# Patient Record
Sex: Female | Born: 1970 | Race: White | Hispanic: No | Smoking: Never smoker
Health system: Southern US, Community
[De-identification: ages and names within clinical notes are randomized; demographics above are authoritative.]

## PROBLEM LIST (undated history)

## (undated) ENCOUNTER — Ambulatory Visit: Admission: EM | Payer: Medicaid Other | Source: Home / Self Care

## (undated) DIAGNOSIS — R519 Headache, unspecified: Secondary | ICD-10-CM

## (undated) DIAGNOSIS — R87619 Unspecified abnormal cytological findings in specimens from cervix uteri: Secondary | ICD-10-CM

## (undated) DIAGNOSIS — Q760 Spina bifida occulta: Secondary | ICD-10-CM

## (undated) DIAGNOSIS — F909 Attention-deficit hyperactivity disorder, unspecified type: Secondary | ICD-10-CM

## (undated) DIAGNOSIS — Z8041 Family history of malignant neoplasm of ovary: Secondary | ICD-10-CM

## (undated) DIAGNOSIS — N390 Urinary tract infection, site not specified: Secondary | ICD-10-CM

## (undated) DIAGNOSIS — I341 Nonrheumatic mitral (valve) prolapse: Secondary | ICD-10-CM

## (undated) DIAGNOSIS — I10 Essential (primary) hypertension: Secondary | ICD-10-CM

## (undated) DIAGNOSIS — N83209 Unspecified ovarian cyst, unspecified side: Secondary | ICD-10-CM

## (undated) DIAGNOSIS — F419 Anxiety disorder, unspecified: Secondary | ICD-10-CM

## (undated) HISTORY — PX: LEEP: SHX91

## (undated) HISTORY — DX: Headache, unspecified: R51.9

## (undated) HISTORY — DX: Essential (primary) hypertension: I10

## (undated) HISTORY — DX: Unspecified abnormal cytological findings in specimens from cervix uteri: R87.619

## (undated) HISTORY — DX: Family history of malignant neoplasm of ovary: Z80.41

---

## 2004-07-26 ENCOUNTER — Emergency Department: Payer: Self-pay | Admitting: Emergency Medicine

## 2004-10-18 ENCOUNTER — Emergency Department: Payer: Self-pay | Admitting: Emergency Medicine

## 2005-01-16 ENCOUNTER — Emergency Department: Payer: Self-pay | Admitting: Emergency Medicine

## 2005-03-09 ENCOUNTER — Emergency Department: Payer: Self-pay | Admitting: Unknown Physician Specialty

## 2005-07-04 ENCOUNTER — Emergency Department: Payer: Self-pay | Admitting: Emergency Medicine

## 2005-07-04 ENCOUNTER — Other Ambulatory Visit: Payer: Self-pay

## 2005-08-26 ENCOUNTER — Emergency Department: Payer: Self-pay | Admitting: Emergency Medicine

## 2005-09-18 ENCOUNTER — Emergency Department: Payer: Self-pay | Admitting: Emergency Medicine

## 2006-03-06 ENCOUNTER — Emergency Department: Payer: Self-pay | Admitting: Emergency Medicine

## 2006-06-03 ENCOUNTER — Emergency Department: Payer: Self-pay | Admitting: Emergency Medicine

## 2006-06-07 ENCOUNTER — Emergency Department: Payer: Self-pay | Admitting: Emergency Medicine

## 2006-08-03 ENCOUNTER — Emergency Department: Payer: Self-pay | Admitting: General Practice

## 2007-03-02 ENCOUNTER — Emergency Department: Payer: Self-pay | Admitting: Emergency Medicine

## 2007-04-29 ENCOUNTER — Emergency Department: Payer: Self-pay | Admitting: Emergency Medicine

## 2007-07-31 ENCOUNTER — Ambulatory Visit: Payer: Self-pay | Admitting: Family Medicine

## 2007-09-28 ENCOUNTER — Ambulatory Visit: Payer: Self-pay | Admitting: Family Medicine

## 2007-11-14 ENCOUNTER — Emergency Department: Payer: Self-pay | Admitting: Emergency Medicine

## 2007-11-20 ENCOUNTER — Ambulatory Visit: Payer: Self-pay | Admitting: Family Medicine

## 2008-01-03 ENCOUNTER — Ambulatory Visit: Payer: Self-pay | Admitting: Family Medicine

## 2008-03-05 ENCOUNTER — Ambulatory Visit: Payer: Self-pay | Admitting: Family Medicine

## 2008-09-18 ENCOUNTER — Emergency Department: Payer: Self-pay | Admitting: Emergency Medicine

## 2009-03-25 ENCOUNTER — Emergency Department: Payer: Self-pay | Admitting: Emergency Medicine

## 2009-07-07 ENCOUNTER — Ambulatory Visit: Payer: Self-pay | Admitting: Internal Medicine

## 2009-07-09 ENCOUNTER — Ambulatory Visit: Payer: Self-pay | Admitting: Internal Medicine

## 2009-07-11 ENCOUNTER — Ambulatory Visit: Payer: Self-pay | Admitting: Internal Medicine

## 2010-03-19 ENCOUNTER — Ambulatory Visit: Payer: Self-pay | Admitting: Family Medicine

## 2010-06-25 ENCOUNTER — Ambulatory Visit: Payer: Self-pay | Admitting: Internal Medicine

## 2010-10-28 ENCOUNTER — Ambulatory Visit: Payer: Self-pay | Admitting: Internal Medicine

## 2010-10-29 ENCOUNTER — Ambulatory Visit: Payer: Self-pay | Admitting: Internal Medicine

## 2010-10-30 ENCOUNTER — Ambulatory Visit: Payer: Self-pay | Admitting: Family Medicine

## 2010-10-31 ENCOUNTER — Emergency Department: Payer: Self-pay | Admitting: Internal Medicine

## 2010-12-04 ENCOUNTER — Ambulatory Visit: Payer: Self-pay | Admitting: Family Medicine

## 2011-01-09 ENCOUNTER — Ambulatory Visit: Payer: Self-pay | Admitting: Family Medicine

## 2011-04-02 ENCOUNTER — Emergency Department: Payer: Self-pay | Admitting: Internal Medicine

## 2011-06-22 ENCOUNTER — Emergency Department: Payer: Self-pay | Admitting: Emergency Medicine

## 2011-12-29 ENCOUNTER — Emergency Department: Payer: Self-pay | Admitting: Unknown Physician Specialty

## 2011-12-29 LAB — COMPREHENSIVE METABOLIC PANEL
Albumin: 3.7 g/dL (ref 3.4–5.0)
Alkaline Phosphatase: 63 U/L (ref 50–136)
Anion Gap: 8 (ref 7–16)
Bilirubin,Total: 0.2 mg/dL (ref 0.2–1.0)
Creatinine: 0.63 mg/dL (ref 0.60–1.30)
Glucose: 77 mg/dL (ref 65–99)
Osmolality: 279 (ref 275–301)
Potassium: 3.3 mmol/L — ABNORMAL LOW (ref 3.5–5.1)
SGPT (ALT): 21 U/L
Total Protein: 7.2 g/dL (ref 6.4–8.2)

## 2011-12-29 LAB — URINALYSIS, COMPLETE
Bilirubin,UR: NEGATIVE
Glucose,UR: NEGATIVE mg/dL (ref 0–75)
Nitrite: NEGATIVE
Protein: NEGATIVE
RBC,UR: 1 /HPF (ref 0–5)
Specific Gravity: 1.001 (ref 1.003–1.030)
Squamous Epithelial: <1
WBC UR: <1 /HPF (ref 0–5)

## 2011-12-29 LAB — CBC
HCT: 35.4 % (ref 35.0–47.0)
MCH: 29.8 pg (ref 26.0–34.0)
MCHC: 33.7 g/dL (ref 32.0–36.0)
Platelet: 249 10*3/uL (ref 150–440)

## 2011-12-29 LAB — PREGNANCY, URINE: Pregnancy Test, Urine: NEGATIVE m[IU]/mL

## 2012-04-08 ENCOUNTER — Emergency Department: Payer: Self-pay | Admitting: Emergency Medicine

## 2012-04-08 LAB — CBC WITH DIFFERENTIAL/PLATELET
Basophil #: 0.1 10*3/uL (ref 0.0–0.1)
HCT: 35.8 % (ref 35.0–47.0)
Lymphocyte #: 1.9 10*3/uL (ref 1.0–3.6)
Lymphocyte %: 42.4 %
MCH: 30 pg (ref 26.0–34.0)
MCHC: 34.2 g/dL (ref 32.0–36.0)
Monocyte #: 0.6 x10 3/mm (ref 0.2–0.9)
Monocyte %: 13.7 %
Neutrophil #: 1.8 10*3/uL (ref 1.4–6.5)
Neutrophil %: 38.7 %
RDW: 13.1 % (ref 11.5–14.5)

## 2012-04-08 LAB — BASIC METABOLIC PANEL
BUN: 10 mg/dL (ref 7–18)
Chloride: 109 mmol/L — ABNORMAL HIGH (ref 98–107)
Co2: 27 mmol/L (ref 21–32)
Creatinine: 0.7 mg/dL (ref 0.60–1.30)
EGFR (African American): >60
Potassium: 3.4 mmol/L — ABNORMAL LOW (ref 3.5–5.1)
Sodium: 141 mmol/L (ref 136–145)

## 2012-04-18 ENCOUNTER — Emergency Department: Payer: Self-pay | Admitting: Unknown Physician Specialty

## 2012-04-18 LAB — URINALYSIS, COMPLETE
Ketone: NEGATIVE
Nitrite: NEGATIVE
Ph: 6 (ref 4.5–8.0)
Protein: NEGATIVE
RBC,UR: 2 /HPF (ref 0–5)

## 2012-04-18 LAB — CBC WITH DIFFERENTIAL/PLATELET
Basophil #: 0.1 10*3/uL (ref 0.0–0.1)
Basophil %: 0.6 %
Eosinophil %: 2.6 %
HGB: 12.9 g/dL (ref 12.0–16.0)
Lymphocyte %: 35.5 %
MCH: 29.4 pg (ref 26.0–34.0)
Monocyte #: 0.5 x10 3/mm (ref 0.2–0.9)
Neutrophil %: 55.1 %
Platelet: 298 10*3/uL (ref 150–440)
RDW: 13.1 % (ref 11.5–14.5)
WBC: 8.4 10*3/uL (ref 3.6–11.0)

## 2012-04-18 LAB — COMPREHENSIVE METABOLIC PANEL
Albumin: 4.1 g/dL (ref 3.4–5.0)
Alkaline Phosphatase: 79 U/L (ref 50–136)
Anion Gap: 9 (ref 7–16)
BUN: 9 mg/dL (ref 7–18)
Calcium, Total: 9.2 mg/dL (ref 8.5–10.1)
EGFR (African American): >60
Glucose: 62 mg/dL — ABNORMAL LOW (ref 65–99)
Osmolality: 278 (ref 275–301)
Potassium: 3.1 mmol/L — ABNORMAL LOW (ref 3.5–5.1)
Sodium: 141 mmol/L (ref 136–145)
Total Protein: 8.2 g/dL (ref 6.4–8.2)

## 2012-04-19 LAB — URINE CULTURE

## 2012-04-24 LAB — CULTURE, BLOOD (SINGLE)

## 2012-04-28 ENCOUNTER — Ambulatory Visit: Payer: Self-pay | Admitting: Family Medicine

## 2012-04-28 LAB — URINALYSIS, COMPLETE
Bilirubin,UR: NEGATIVE
Glucose,UR: NEGATIVE mg/dL (ref 0–75)
Ketone: NEGATIVE
Ph: 6.5 (ref 4.5–8.0)

## 2012-04-29 LAB — URINE CULTURE

## 2012-05-22 ENCOUNTER — Ambulatory Visit: Payer: Self-pay

## 2012-05-22 LAB — URINALYSIS, COMPLETE
Bacteria: NEGATIVE
Bilirubin,UR: NEGATIVE
Leukocyte Esterase: NEGATIVE
Nitrite: NEGATIVE
Ph: 5 (ref 4.5–8.0)
Specific Gravity: 1.01 (ref 1.003–1.030)

## 2012-05-22 LAB — WET PREP, GENITAL

## 2012-05-22 LAB — PREGNANCY, URINE: Pregnancy Test, Urine: NEGATIVE m[IU]/mL

## 2012-05-23 LAB — URINE CULTURE

## 2012-06-15 ENCOUNTER — Ambulatory Visit: Payer: Self-pay | Admitting: Internal Medicine

## 2012-06-15 LAB — URINALYSIS, COMPLETE
Bilirubin,UR: NEGATIVE
Glucose,UR: NEGATIVE mg/dL (ref 0–75)
Ketone: NEGATIVE
Specific Gravity: 1.005 (ref 1.003–1.030)
WBC UR: NONE SEEN /HPF (ref 0–5)

## 2012-08-15 ENCOUNTER — Ambulatory Visit: Payer: Self-pay | Admitting: Physician Assistant

## 2012-08-16 ENCOUNTER — Ambulatory Visit: Payer: Self-pay | Admitting: Physician Assistant

## 2012-08-16 LAB — CLOSTRIDIUM DIFFICILE BY PCR

## 2012-08-22 ENCOUNTER — Ambulatory Visit: Payer: Self-pay

## 2012-10-08 ENCOUNTER — Ambulatory Visit: Payer: Self-pay | Admitting: Family Medicine

## 2012-10-24 ENCOUNTER — Emergency Department: Payer: Self-pay | Admitting: Emergency Medicine

## 2012-10-24 LAB — CBC
HCT: 39.9 % (ref 35.0–47.0)
MCV: 89 fL (ref 80–100)
RBC: 4.48 10*6/uL (ref 3.80–5.20)
RDW: 13.4 % (ref 11.5–14.5)

## 2012-10-24 LAB — URINALYSIS, COMPLETE
Bilirubin,UR: NEGATIVE
Ketone: NEGATIVE
Leukocyte Esterase: NEGATIVE
Nitrite: NEGATIVE
Ph: 6 (ref 4.5–8.0)
Specific Gravity: 1.006 (ref 1.003–1.030)
Squamous Epithelial: <1
WBC UR: <1 /HPF (ref 0–5)

## 2012-10-24 LAB — WET PREP, GENITAL

## 2013-02-20 ENCOUNTER — Ambulatory Visit: Payer: Self-pay | Admitting: Internal Medicine

## 2013-02-20 LAB — URINALYSIS, COMPLETE
Glucose,UR: NEGATIVE mg/dL (ref 0–75)
Ketone: NEGATIVE
Ph: 5 (ref 4.5–8.0)
Specific Gravity: 1.025 (ref 1.003–1.030)

## 2013-02-23 ENCOUNTER — Ambulatory Visit: Payer: Self-pay | Admitting: Internal Medicine

## 2013-02-23 LAB — WBCS, STOOL

## 2013-04-12 ENCOUNTER — Encounter: Payer: Self-pay | Admitting: *Deleted

## 2013-04-25 ENCOUNTER — Ambulatory Visit: Payer: Self-pay | Admitting: General Surgery

## 2013-05-13 ENCOUNTER — Ambulatory Visit: Payer: Self-pay | Admitting: Family Medicine

## 2013-05-13 LAB — URINALYSIS, COMPLETE
Bacteria: NEGATIVE
Ketone: NEGATIVE
Leukocyte Esterase: NEGATIVE
Nitrite: NEGATIVE

## 2013-05-15 LAB — URINE CULTURE

## 2013-05-16 LAB — BETA STREP CULTURE(ARMC)

## 2013-05-25 ENCOUNTER — Encounter: Payer: Self-pay | Admitting: *Deleted

## 2013-08-03 ENCOUNTER — Ambulatory Visit: Payer: Self-pay | Admitting: Family Medicine

## 2013-08-03 LAB — URINALYSIS, COMPLETE
BILIRUBIN, UR: NEGATIVE
Glucose,UR: NEGATIVE mg/dL (ref 0–75)
KETONE: NEGATIVE
NITRITE: NEGATIVE
Ph: 6 (ref 4.5–8.0)
Protein: NEGATIVE
Specific Gravity: 1.015 (ref 1.003–1.030)

## 2013-08-05 ENCOUNTER — Ambulatory Visit: Payer: Self-pay | Admitting: Family Medicine

## 2013-08-05 LAB — URINALYSIS, COMPLETE
Bilirubin,UR: NEGATIVE
Glucose,UR: NEGATIVE mg/dL (ref 0–75)
KETONE: NEGATIVE
Leukocyte Esterase: NEGATIVE
NITRITE: NEGATIVE
Ph: 6 (ref 4.5–8.0)
Protein: NEGATIVE
SPECIFIC GRAVITY: 1.005 (ref 1.003–1.030)

## 2013-08-05 LAB — URINE CULTURE

## 2013-08-07 LAB — RAPID INFLUENZA A&B ANTIGENS

## 2013-11-02 ENCOUNTER — Ambulatory Visit: Payer: Self-pay | Admitting: Physician Assistant

## 2013-11-02 LAB — URINALYSIS, COMPLETE
Bacteria: NEGATIVE
Bilirubin,UR: NEGATIVE
Glucose,UR: NEGATIVE mg/dL (ref 0–75)
KETONE: NEGATIVE
Leukocyte Esterase: NEGATIVE
NITRITE: NEGATIVE
PH: 6.5 (ref 4.5–8.0)
PROTEIN: NEGATIVE
Specific Gravity: 1.01 (ref 1.003–1.030)

## 2013-11-03 LAB — URINE CULTURE

## 2014-01-28 ENCOUNTER — Emergency Department: Payer: Self-pay | Admitting: Emergency Medicine

## 2014-01-31 LAB — BETA STREP CULTURE(ARMC)

## 2014-02-24 ENCOUNTER — Emergency Department: Payer: Self-pay | Admitting: Emergency Medicine

## 2014-02-24 LAB — URINALYSIS, COMPLETE
BILIRUBIN, UR: NEGATIVE
Glucose,UR: NEGATIVE mg/dL (ref 0–75)
KETONE: NEGATIVE
Leukocyte Esterase: NEGATIVE
NITRITE: NEGATIVE
Ph: 6 (ref 4.5–8.0)
Protein: NEGATIVE
SPECIFIC GRAVITY: 1.002 (ref 1.003–1.030)
Squamous Epithelial: 2

## 2014-02-24 LAB — PREGNANCY, URINE: Pregnancy Test, Urine: NEGATIVE m[IU]/mL

## 2014-02-27 LAB — URINE CULTURE

## 2014-05-04 ENCOUNTER — Ambulatory Visit: Payer: Self-pay | Admitting: Physician Assistant

## 2014-05-04 LAB — URINALYSIS, COMPLETE
Bacteria: NEGATIVE
Bilirubin,UR: NEGATIVE
Glucose,UR: NEGATIVE
Ketone: NEGATIVE
Leukocyte Esterase: NEGATIVE
Nitrite: NEGATIVE
Ph: 6.5 (ref 5.0–8.0)
Protein: NEGATIVE
SQUAMOUS EPITHELIAL: NONE SEEN
Specific Gravity: 1.01 (ref 1.000–1.030)
WBC UR: NONE SEEN /HPF (ref 0–5)

## 2014-06-12 ENCOUNTER — Ambulatory Visit: Payer: Self-pay | Admitting: Family Medicine

## 2014-06-12 LAB — RAPID STREP-A WITH REFLX: Micro Text Report: NEGATIVE

## 2014-06-15 LAB — BETA STREP CULTURE(ARMC)

## 2014-06-16 ENCOUNTER — Ambulatory Visit: Payer: Self-pay | Admitting: Emergency Medicine

## 2014-06-16 LAB — URINALYSIS, COMPLETE
Bilirubin,UR: NEGATIVE
Glucose,UR: NEGATIVE
Ketone: NEGATIVE
Nitrite: NEGATIVE
PH: 6 (ref 5.0–8.0)
Protein: NEGATIVE
Specific Gravity: 1.015 (ref 1.000–1.030)

## 2014-07-23 ENCOUNTER — Emergency Department: Payer: Self-pay | Admitting: Emergency Medicine

## 2014-07-23 LAB — URINALYSIS, COMPLETE
Bilirubin,UR: NEGATIVE
Glucose,UR: NEGATIVE mg/dL (ref 0–75)
KETONE: NEGATIVE
LEUKOCYTE ESTERASE: NEGATIVE
Nitrite: NEGATIVE
PROTEIN: NEGATIVE
Ph: 7 (ref 4.5–8.0)
RBC,UR: <1 /HPF (ref 0–5)
SQUAMOUS EPITHELIAL: NONE SEEN
Specific Gravity: 1.001 (ref 1.003–1.030)
WBC UR: NONE SEEN /HPF (ref 0–5)

## 2014-07-23 LAB — PREGNANCY, URINE: PREGNANCY TEST, URINE: NEGATIVE m[IU]/mL

## 2014-08-07 ENCOUNTER — Ambulatory Visit: Payer: Self-pay

## 2014-08-07 LAB — URINALYSIS, COMPLETE
Bacteria: NEGATIVE
Bilirubin,UR: NEGATIVE
Glucose,UR: NEGATIVE
KETONE: NEGATIVE
Leukocyte Esterase: NEGATIVE
Nitrite: NEGATIVE
PROTEIN: NEGATIVE
Ph: 6 (ref 5.0–8.0)
Specific Gravity: 1.005 (ref 1.000–1.030)

## 2014-08-09 LAB — URINE CULTURE

## 2014-10-11 ENCOUNTER — Ambulatory Visit: Payer: Self-pay | Admitting: Physician Assistant

## 2014-11-08 ENCOUNTER — Ambulatory Visit
Admit: 2014-11-08 | Disposition: A | Discharge status: Home / Self Care | Payer: Self-pay | Attending: Family Medicine | Admitting: Family Medicine

## 2014-11-08 LAB — URINALYSIS, COMPLETE
BILIRUBIN, UR: NEGATIVE
Glucose,UR: NEGATIVE
KETONE: NEGATIVE
Leukocyte Esterase: NEGATIVE
Nitrite: NEGATIVE
Ph: 6 (ref 5.0–8.0)
Protein: NEGATIVE
Specific Gravity: 1.01 (ref 1.000–1.030)

## 2014-11-11 LAB — URINE CULTURE

## 2014-11-29 ENCOUNTER — Ambulatory Visit
Admission: EM | Admit: 2014-11-29 | Discharge: 2014-11-29 | Disposition: A | Discharge status: Home / Self Care | Payer: Medicaid Other | Attending: Family Medicine | Admitting: Family Medicine

## 2014-11-29 DIAGNOSIS — S30860A Insect bite (nonvenomous) of lower back and pelvis, initial encounter: Secondary | ICD-10-CM

## 2014-11-29 DIAGNOSIS — R21 Rash and other nonspecific skin eruption: Secondary | ICD-10-CM

## 2014-11-29 DIAGNOSIS — W57XXXA Bitten or stung by nonvenomous insect and other nonvenomous arthropods, initial encounter: Secondary | ICD-10-CM

## 2014-11-29 DIAGNOSIS — S20221A Contusion of right back wall of thorax, initial encounter: Secondary | ICD-10-CM | POA: Diagnosis not present

## 2014-11-29 HISTORY — DX: Attention-deficit hyperactivity disorder, unspecified type: F90.9

## 2014-11-29 HISTORY — DX: Nonrheumatic mitral (valve) prolapse: I34.1

## 2014-11-29 HISTORY — DX: Spina bifida occulta: Q76.0

## 2014-11-29 HISTORY — DX: Urinary tract infection, site not specified: N39.0

## 2014-11-29 MED ORDER — MUPIROCIN 2 % EX OINT: TOPICAL_OINTMENT | CUTANEOUS | Status: DC

## 2014-11-29 NOTE — ED Provider Notes (Signed)
CSN: [id number]     Arrival date & time 11/29/14  1514 History   First MD Initiated Contact with Patient 11/29/14 1526     Chief Complaint  Patient presents with  . Rash   (Consider location/radiation/quality/duration/timing/severity/associated sxs/prior Treatment) Patient is a 44 y.o. female presenting with rash. The history is provided by the patient.  Rash Location: R buttocks. Quality: bruising and redness   Severity:  Mild Timing:  Constant Chronicity:  New Context comment:  She is not sure if it is an sect bite or tick bite Relieved by:  Nothing Worsened by:  Nothing tried Associated symptoms: no fever     Past Medical History  Diagnosis Date  . ADHD (attention deficit hyperactivity disorder)   . Mitral valve prolapse   . UTI (urinary tract infection)   . Spina bifida occulta    Past Surgical History  Procedure Laterality Date  . Leep     History reviewed. No pertinent family history. History  Substance Use Topics  . Smoking status: Never Smoker   . Smokeless tobacco: Not on file  . Alcohol Use: No   OB History    No data available     Review of Systems  Constitutional: Negative for fever.  All other systems reviewed and are negative.   Allergies  Bactrim; Amoxicillin; Doxycycline; and Keflex  Home Medications   Prior to Admission medications   Medication Sig Start Date End Date Taking? Authorizing Provider  ALPRAZolam Duanne Moron) 1 MG tablet Take 1 mg by mouth 3 (three) times daily as needed for anxiety.   Yes Historical Provider, MD  amphetamine-dextroamphetamine (ADDERALL) 10 MG tablet Take 10 mg by mouth 2 (two) times daily.   Yes Historical Provider, MD  cetirizine (ZYRTEC) 10 MG tablet Take 10 mg by mouth daily.   Yes Historical Provider, MD  mupirocin ointment (BACTROBAN) 2 % APPLY TO AREA THREE TIMES A DAY 11/29/14   Frederich Cha, MD   BP 132/89 mmHg  Pulse 90  Temp(Src) 96.7 F (35.9 C) (Tympanic)  Resp 18  Ht 5\' 5"  (1.651 m)  Wt 180 lb (81.647  kg)  BMI 29.95 kg/m2  SpO2 98%  LMP 11/23/2014 Physical Exam  Constitutional: She is oriented to person, place, and time. She appears well-developed and well-nourished.  HENT:  Head: Normocephalic.  Musculoskeletal: Normal range of motion.  Neurological: She is alert and oriented to person, place, and time.  Skin: Rash noted.  Lesion on R buttocks appears to be more of a bruise than a bite.  Psychiatric: She has a normal mood and affect.    ED Course  Procedures (including critical care time) Labs Review Labs Reviewed - No data to display  Imaging Review No results found.   MDM   1. Rash and nonspecific skin eruption   2. Insect bite of buttock with local reaction, initial encounter   3. Superficial bruising of back, right, initial encounter        Frederich Cha, MD 11/29/14 [phone]

## 2014-11-29 NOTE — ED Notes (Signed)
Complaints of a "dark tiny spot" on right lower buttock x 3 days. Suspect tick bite but she is not sure. Felt dizzy and nauseous "couple days ago but that may be because I got off my birth control". Area tender to touch.

## 2015-01-01 ENCOUNTER — Ambulatory Visit
Admission: EM | Admit: 2015-01-01 | Discharge: 2015-01-01 | Disposition: A | Discharge status: Home / Self Care | Payer: Medicaid Other | Attending: Family Medicine | Admitting: Family Medicine

## 2015-01-01 ENCOUNTER — Encounter: Payer: Self-pay | Admitting: Emergency Medicine

## 2015-01-01 DIAGNOSIS — F909 Attention-deficit hyperactivity disorder, unspecified type: Secondary | ICD-10-CM | POA: Diagnosis not present

## 2015-01-01 DIAGNOSIS — Z79899 Other long term (current) drug therapy: Secondary | ICD-10-CM | POA: Insufficient documentation

## 2015-01-01 DIAGNOSIS — I341 Nonrheumatic mitral (valve) prolapse: Secondary | ICD-10-CM | POA: Diagnosis not present

## 2015-01-01 DIAGNOSIS — R3914 Feeling of incomplete bladder emptying: Secondary | ICD-10-CM | POA: Insufficient documentation

## 2015-01-01 DIAGNOSIS — R300 Dysuria: Secondary | ICD-10-CM | POA: Insufficient documentation

## 2015-01-01 DIAGNOSIS — R350 Frequency of micturition: Secondary | ICD-10-CM | POA: Diagnosis not present

## 2015-01-01 DIAGNOSIS — IMO0001 Reserved for inherently not codable concepts without codable children: Secondary | ICD-10-CM

## 2015-01-01 DIAGNOSIS — R109 Unspecified abdominal pain: Secondary | ICD-10-CM | POA: Diagnosis present

## 2015-01-01 LAB — URINALYSIS COMPLETE WITH MICROSCOPIC (ARMC ONLY)
BACTERIA UA: NONE SEEN — AB
BILIRUBIN URINE: NEGATIVE
GLUCOSE, UA: NEGATIVE mg/dL
KETONES UR: NEGATIVE mg/dL
Leukocytes, UA: NEGATIVE
NITRITE: NEGATIVE
Protein, ur: NEGATIVE mg/dL
Specific Gravity, Urine: 1.01 (ref 1.005–1.030)
pH: 6 (ref 5.0–8.0)

## 2015-01-01 LAB — PREGNANCY, URINE: PREG TEST UR: NEGATIVE

## 2015-01-01 NOTE — ED Notes (Signed)
Pt having abdominal cramping, urinary freq, took a macrobid she had at home, but her tongue swelled up. A doctor told her over the phone to take it with benadryl.  Having symptoms for a couple of days. Also sharp back pain. Was seeing Dr. Chancy Milroy.

## 2015-01-01 NOTE — ED Provider Notes (Signed)
CSN: [id number]     Arrival date & time 01/01/15  1611 History   First MD Initiated Contact with Patient 01/01/15 1703     Chief Complaint  Patient presents with  . Abdominal Cramping  . Urinary Frequency   (Consider location/radiation/quality/duration/timing/severity/associated sxs/prior Treatment) Patient is a 44 y.o. female presenting with dysuria. The history is provided by the patient.  Dysuria Pain quality:  Aching Pain severity:  Mild Onset quality:  Unable to specify Timing:  Intermittent Progression:  Waxing and waning Chronicity:  Recurrent Recent urinary tract infections: yes   Worsened by:  Caffeine Ineffective treatments:  Acetaminophen, NSAIDs and cranberry juice Urinary symptoms: frequent urination   Associated symptoms: no abdominal pain, no fever, no flank pain, no nausea, no vaginal discharge and no vomiting   Risk factors: recurrent urinary tract infections   Risk factors: not sexually active   44 yo F reports recurrent issues with bladder since 66 yo son was delivered. 8'10"  Forceps delivery wore an indwelling catheter home and kept for a couple of weeks by her report. In earlier years had multiple UTIs related to sexual activity. Now reports frequent episodes of the sensation of incomplete voiding, the need to repeat void frequently and dysuria. Denies sexual activity"for a long time". Had some low abdominal cramping and what she thought was dysuria and treated herself with a leftover Macrobid she had at home. Reports that her tongue got swollen and she had a verbal report to take benadryl from an outside office.Symptoms resolved.  Of note she electively stopped her BCPs continuous about a month ago because "she didn;t need them". Has not seen menstrual bleeding. Her young son accompanies her to appointment and is in attendance in exam room.  Has many antibiotic allergies/intolerances-see record.Has had multiple start up evaluations of bladder dysfunction but seems not to  have followed any to completion. Difficult to keep focused on current  presenting complaint as she has a list of things she has concerns about. Not feeling good rapport with present PCP needs a home base and coordinator of careplan. Insurance restrictions reported Past Medical History  Diagnosis Date  . ADHD (attention deficit hyperactivity disorder)   . Mitral valve prolapse   . UTI (urinary tract infection)   . Spina bifida occulta    Past Surgical History  Procedure Laterality Date  . Leep     No family history on file. History  Substance Use Topics  . Smoking status: Never Smoker   . Smokeless tobacco: Not on file  . 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  Negative for fever.  HENT: Negative.   Eyes: Negative.   Respiratory: Negative.  Negative for shortness of breath.   Cardiovascular: Negative.   Gastrointestinal: Negative for nausea, vomiting, abdominal pain and abdominal distention.  Endocrine: Negative.   Genitourinary: Positive for dysuria, urgency, frequency and menstrual problem. Negative for flank pain, vaginal bleeding and vaginal discharge.  Musculoskeletal: Negative.   Skin: Negative.   Allergic/Immunologic: Negative.   Neurological: Negative.   Hematological: Negative.   Psychiatric/Behavioral: Positive for decreased concentration. The patient is hyperactive.     Allergies  Bactrim; Macrobid; Amoxicillin;  Doxycycline; and Keflex  Home Medications   Prior to Admission medications   Medication Sig Start Date End Date Taking? Authorizing Provider  ALPRAZolam Duanne Moron) 1 MG tablet Take 1 mg by mouth 3 (three) times daily as needed for anxiety.    Historical Provider, MD  amphetamine-dextroamphetamine (ADDERALL)  10 MG tablet Take 10 mg by mouth 2 (two) times daily.    Historical Provider, MD  cetirizine (ZYRTEC) 10 MG tablet Take 10 mg by mouth daily.    Historical Provider, MD  mupirocin ointment (BACTROBAN) 2 % APPLY TO AREA THREE TIMES A DAY 11/29/14   Frederich Cha, MD   BP 117/75 mmHg  Pulse 95  Temp(Src) 98.8 F (37.1 C) (Tympanic)  Resp 16  Ht 5\' 8"  (1.727 m)  Wt 180 lb (81.647 kg)  BMI 27.38 kg/m2  SpO2 99%  LMP 11/17/2014 (Approximate) Physical Exam Constitutional -alert and oriented,well appearing and in no acute distress Head-atraumatic Eyes- conjunctiva normal, EOMI ,conjugate gaze Ears -negative bilat Nose- no congestion or rhinorrhea Mouth/throat- mucous membranes moist , Neck- supple  CV- regular rate, grossly normal heart sounds,  Resp-no distress, normal respiratory effort,clear to auscultation bilaterally Back - no CVAT GI- soft,non-tender,no distention, normal bowel sounds GU-not examined ( pt defers-son in room ),  MSK- no lower extremity tenderness nor edema,no joint effusion, ambulatory Neuro- normal speech and language, no gross focal neurological deficit appreciated, easily distracted, changes subjects frequently before completing concern at hand Skin-warm,dry ,intact;   ED Course  Procedures (including critical care time) Labs Review Labs Reviewed  URINALYSIS COMPLETEWITH MICROSCOPIC ([place]) - Abnormal; Notable for the following:    Hgb urine dipstick 1+ (*)    Bacteria, UA NONE SEEN (*)    Squamous Epithelial / LPF 0-5 (*)    All other components within normal limits  URINE CULTURE  PREGNANCY, URINE    Imaging Review No results found.     MDM   1. Feeling of incomplete bladder emptying   2. Frequency   3. Dysuria    Was very surprised to hear of negative urinalysis and she frequently self diagnoses and treats similar feels. Her description is very compatible with bladder dysfunction and with a large baby ,forceps and apparent post partum  dystonia she is a candidate for a GYN  and Uro evaluation or directly to [facility] if that could be arranged. Though u/a is grossly clean will allow it to be cultured for completeness- she did take one does atb a few days ago. She defers exam at this time as she won't let child out of room and does not want exam with him present understandably. She D/Ced her BCPs a few weeks ago--date unsure-- adamantly denying any exposure need. May be having mittleschmerz as cycles work to reestablish. Reports she was regular with moderate flow before BCPS.Marland Kitchen Her symptoms sound very mild. She is encouraged to use tylenol or ibuprofen and observe- report back to [place] ( utilizing outside child care if possible- or willingness to allow pelvic) should she develop increased discomfort. Reminded that light vaginal bleeding might occur and if menstrual like should be observed and recorded in datebook for future reference.  Diagnosis and recommended evaluations for treatment discussed. . Questions fielded, expectations  reviewed. Patient expresses understanding. Will return to [facility] with questions, concern or exacerbation.      Jan Fireman, PA-C 01/02/15 1207

## 2015-01-02 ENCOUNTER — Encounter: Payer: Self-pay | Admitting: Physician Assistant

## 2015-01-03 LAB — URINE CULTURE: Special Requests: NORMAL

## 2015-01-09 ENCOUNTER — Ambulatory Visit
Admission: EM | Admit: 2015-01-09 | Discharge: 2015-01-09 | Disposition: A | Discharge status: Home / Self Care | Payer: Medicaid Other | Attending: Family Medicine | Admitting: Family Medicine

## 2015-01-09 DIAGNOSIS — R8299 Other abnormal findings in urine: Secondary | ICD-10-CM | POA: Diagnosis not present

## 2015-01-09 DIAGNOSIS — R319 Hematuria, unspecified: Secondary | ICD-10-CM | POA: Diagnosis not present

## 2015-01-09 DIAGNOSIS — Z79899 Other long term (current) drug therapy: Secondary | ICD-10-CM | POA: Diagnosis not present

## 2015-01-09 DIAGNOSIS — R82998 Other abnormal findings in urine: Secondary | ICD-10-CM

## 2015-01-09 DIAGNOSIS — I341 Nonrheumatic mitral (valve) prolapse: Secondary | ICD-10-CM | POA: Diagnosis not present

## 2015-01-09 DIAGNOSIS — F909 Attention-deficit hyperactivity disorder, unspecified type: Secondary | ICD-10-CM | POA: Diagnosis not present

## 2015-01-09 DIAGNOSIS — N3090 Cystitis, unspecified without hematuria: Secondary | ICD-10-CM | POA: Diagnosis present

## 2015-01-09 LAB — URINALYSIS COMPLETE WITH MICROSCOPIC (ARMC ONLY)
Bacteria, UA: NONE SEEN — AB
Bilirubin Urine: NEGATIVE
GLUCOSE, UA: NEGATIVE mg/dL
Ketones, ur: NEGATIVE mg/dL
LEUKOCYTES UA: NEGATIVE
NITRITE: NEGATIVE
PH: 7 (ref 5.0–8.0)
PROTEIN: NEGATIVE mg/dL
SPECIFIC GRAVITY, URINE: 1.01 (ref 1.005–1.030)

## 2015-01-09 NOTE — ED Provider Notes (Signed)
CSN: [id number]     Arrival date & time 01/09/15  1526 History   First MD Initiated Contact with Patient 01/09/15 1610     Chief Complaint  Patient presents with  . Cystitis   (Consider location/radiation/quality/duration/timing/severity/associated sxs/prior Treatment) HPI Comments: 44 yo female with a complaint of frequent urination and mild discomfort with urination. Denies any fevers, chills, vomiting, flank pain.   Past Medical History  Diagnosis Date  . ADHD (attention deficit hyperactivity disorder)   . Mitral valve prolapse   . UTI (urinary tract infection)   . Spina bifida occulta    Past Surgical History  Procedure Laterality Date  . Leep    . Leep     Family History  Problem Relation Age of Onset  . Cancer Mother   . Cancer Father    History  Substance Use Topics  . Smoking status: Never Smoker   . Smokeless tobacco: Not on file  . 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 Authorizing Provider  ALPRAZolam Duanne Moron) 1 MG tablet Take 1 mg by mouth 3 (three) times daily as needed for anxiety.   Yes Historical Provider, MD  amphetamine-dextroamphetamine (ADDERALL) 10 MG tablet Take 10 mg by mouth 2 (two) times daily.   Yes Historical Provider, MD  cetirizine (ZYRTEC) 10 MG tablet Take 10 mg by mouth daily.   Yes Historical Provider, MD  mupirocin ointment (BACTROBAN) 2 % APPLY TO AREA THREE TIMES A DAY 11/29/14   Frederich Cha, MD   BP 134/80 mmHg  Pulse 82  Temp(Src) 97.8 F (36.6 C) (Tympanic)  Resp 16  Ht 5\' 5"  (1.651 m)  Wt 180 lb (81.647 kg)  BMI 29.95 kg/m2  SpO2 100%   LMP 01/03/2015 (Approximate) Physical Exam  Constitutional: She appears well-developed and well-nourished. No distress.  Abdominal: Soft. Bowel sounds are normal. She exhibits no distension and no mass. There is no tenderness. There is no rebound and no guarding.  Skin: No rash noted. She is not diaphoretic.  Nursing note and vitals reviewed.   ED Course  Procedures (including critical care time) Labs Review Labs Reviewed  URINALYSIS COMPLETEWITH MICROSCOPIC ([place]) - Abnormal; Notable for the following:    Hgb urine dipstick 1+ (*)    Bacteria, UA NONE SEEN (*)    Squamous Epithelial / LPF 0-5 (*)    All other components within normal limits  URINE CULTURE    Imaging Review No results found.   MDM   1. Crystalluria   2. Hematuria    Discharge Medication List as of 01/09/2015  4:42 PM    Plan: 1. Test results and diagnosis reviewed with patient 2.  Recommend supportive treatment with increased fluids and otc analgesics 3. Recommend establish care with PCP and f/u with urologist 4. F/u prn if symptoms worsen or don't improve    Norval Gable, MD 01/09/15 2047

## 2015-01-09 NOTE — Discharge Instructions (Signed)
Recommend establish care with PCP and urologist

## 2015-01-11 LAB — URINE CULTURE: Culture: 2000

## 2015-02-16 ENCOUNTER — Encounter: Payer: Self-pay | Admitting: *Deleted

## 2015-02-16 ENCOUNTER — Emergency Department: Payer: Medicaid Other

## 2015-02-16 ENCOUNTER — Emergency Department
Admission: EM | Admit: 2015-02-16 | Discharge: 2015-02-16 | Disposition: A | Discharge status: Home / Self Care | Payer: Medicaid Other | Attending: Emergency Medicine | Admitting: Emergency Medicine

## 2015-02-16 DIAGNOSIS — N8320 Unspecified ovarian cysts: Secondary | ICD-10-CM | POA: Diagnosis not present

## 2015-02-16 DIAGNOSIS — Z79899 Other long term (current) drug therapy: Secondary | ICD-10-CM | POA: Diagnosis not present

## 2015-02-16 DIAGNOSIS — R319 Hematuria, unspecified: Secondary | ICD-10-CM | POA: Diagnosis present

## 2015-02-16 DIAGNOSIS — R350 Frequency of micturition: Secondary | ICD-10-CM | POA: Diagnosis not present

## 2015-02-16 DIAGNOSIS — N83202 Unspecified ovarian cyst, left side: Secondary | ICD-10-CM

## 2015-02-16 DIAGNOSIS — Z792 Long term (current) use of antibiotics: Secondary | ICD-10-CM | POA: Diagnosis not present

## 2015-02-16 DIAGNOSIS — Z880 Allergy status to penicillin: Secondary | ICD-10-CM | POA: Diagnosis not present

## 2015-02-16 DIAGNOSIS — R109 Unspecified abdominal pain: Secondary | ICD-10-CM

## 2015-02-16 HISTORY — DX: Anxiety disorder, unspecified: F41.9

## 2015-02-16 LAB — URINALYSIS COMPLETE WITH MICROSCOPIC (ARMC ONLY)
Bilirubin Urine: NEGATIVE
Glucose, UA: NEGATIVE mg/dL
Ketones, ur: NEGATIVE mg/dL
LEUKOCYTES UA: NEGATIVE
Nitrite: NEGATIVE
Protein, ur: NEGATIVE mg/dL
RBC / HPF: NONE SEEN RBC/hpf (ref 0–5)
Specific Gravity, Urine: 1.005 (ref 1.005–1.030)
WBC, UA: NONE SEEN WBC/hpf (ref 0–5)
pH: 6 (ref 5.0–8.0)

## 2015-02-16 NOTE — ED Provider Notes (Signed)
[facility] Emergency Department Provider Note  ____________________________________________  Time seen:   I have reviewed the triage vital signs and the nursing notes.   HISTORY  Chief Complaint Hematuria      HPI Shirley [name] is a 44 y.o. female presents with urinary frequency and urgency 11 years however with worsening over the past week. Patient denies any dysuria or hematuria patient states that she has had feeling of pressure and urinary urgency and frequency since delivering her son in 2005.     Past Medical History  Diagnosis Date  . ADHD (attention deficit hyperactivity disorder)   . Mitral valve prolapse   . UTI (urinary tract infection)   . Spina bifida occulta   . Anxiety     There are no active problems to display for this patient.   Past Surgical History  Procedure Laterality Date  . Leep    . Leep      Current Outpatient Rx  Name  Route  Sig  Dispense  Refill  . ALPRAZolam (XANAX) 1 MG tablet   Oral   Take 1 mg by mouth 3 (three) times daily as needed for anxiety.         Marland Kitchen amphetamine-dextroamphetamine (ADDERALL) 10 MG tablet   Oral   Take 10 mg by mouth 2 (two) times daily.         . cetirizine (ZYRTEC) 10 MG tablet   Oral   Take 10 mg by mouth daily.         . mupirocin ointment (BACTROBAN) 2 %      APPLY TO AREA THREE TIMES A DAY   22 g   0     Allergies Bactrim; Macrobid; Amoxicillin; Doxycycline; and Keflex  Family History  Problem Relation Age of Onset  . Cancer Mother   . Cancer Father     Social History History  Substance Use Topics  . Smoking status: Never Smoker   . Smokeless tobacco: Not on file  . Alcohol Use: No    Review of Systems  Constitutional: Negative for fever. Eyes: Negative for visual changes. ENT: Negative for sore throat. Cardiovascular: Negative for chest pain. Respiratory: Negative for shortness of breath. Gastrointestinal: Negative for abdominal pain,  vomiting and diarrhea. Genitourinary: Negative for dysuria. Musculoskeletal: Negative for back pain. Skin: Negative for rash. Neurological: Negative for headaches, focal weakness or numbness.   10-point ROS otherwise negative.  ____________________________________________   PHYSICAL EXAM:  VITAL SIGNS: ED Triage Vitals  Enc Vitals Group     BP 02/16/15 0026 128/73 mmHg     Pulse Rate 02/16/15 0026 79     Resp 02/16/15 0026 18     Temp 02/16/15 0026 98.7 F (37.1 C)     Temp Source 02/16/15 0026 Oral     SpO2 02/16/15 0026 98 %     Weight 02/16/15 0032 175 lb (79.379 kg)     Height 02/16/15 0032 5\' 6"  (1.676 m)     Head Cir --      Peak Flow --      Pain Score --      Pain Loc --      Pain Edu? --      Excl. in [place]? --      Constitutional: Alert and oriented. Well appearing and in no distress. Eyes: Conjunctivae are normal. PERRL. Normal extraocular movements. ENT   Head: Normocephalic and atraumatic.   Nose: No congestion/rhinnorhea.   Mouth/Throat: Mucous membranes are moist.  Neck: No stridor. Cardiovascular: Normal rate, regular rhythm. Normal and symmetric distal pulses are present in all extremities. No murmurs, rubs, or gallops. Respiratory: Normal respiratory effort without tachypnea nor retractions. Breath sounds are clear and equal bilaterally. No wheezes/rales/rhonchi. Gastrointestinal: Soft and nontender. No distention. There is no CVA tenderness. Genitourinary: deferred Musculoskeletal: Nontender with normal range of motion in all extremities. No joint effusions.  No lower extremity tenderness nor edema. Neurologic:  Normal speech and language. No gross focal neurologic deficits are appreciated. Speech is normal.  Skin:  Skin is warm, dry and intact. No rash noted. Psychiatric: Mood and affect are normal. Speech and behavior are normal. Patient exhibits appropriate insight and judgment.  ____________________________________________    LABS  (pertinent positives/negatives)  Labs Reviewed  URINALYSIS COMPLETEWITH MICROSCOPIC ([place]) - Abnormal; Notable for the following:    Color, Urine COLORLESS (*)    APPearance CLEAR (*)    Hgb urine dipstick 2+ (*)    Bacteria, UA RARE (*)    Squamous Epithelial / LPF 0-5 (*)    All other components within normal limits    _______________  RADIOLOGY  CT scan revealed:   CT RENAL STONE STUDY (Final result) Result time: 02/16/15 02:47:44   Final result by Rad Results In Interface (02/16/15 02:47:44)   Narrative:   CLINICAL DATA: Acute onset of left flank pain. Initial encounter.  EXAM: CT ABDOMEN AND PELVIS WITHOUT CONTRAST  TECHNIQUE: Multidetector CT imaging of the abdomen and pelvis was performed following the standard protocol without IV contrast.  COMPARISON: Right-sided abdominal ultrasound performed 12/29/2011  FINDINGS: The visualized lung bases are clear.  The liver and spleen are unremarkable in appearance. The gallbladder is within normal limits. The pancreas and adrenal glands are unremarkable.  The kidneys are unremarkable in appearance. There is no evidence of hydronephrosis. No renal or ureteral stones are seen. No perinephric stranding is appreciated.  No free fluid is identified. Slight wall thickening along the distal ileum, within the pelvis, may reflect chronic sequelae of inflammation. The small bowel is otherwise unremarkable. The terminal ileum is unremarkable in appearance. The stomach is within normal limits. No acute vascular abnormalities are seen.  The appendix is normal in caliber, without evidence of appendicitis. The colon is unremarkable in appearance.  The bladder is mildly distended and grossly unremarkable. The uterus is grossly unremarkable in appearance. A 3.2 cm right adnexal cystic lesion is noted. The left ovary is unremarkable in appearance. No inguinal lymphadenopathy is seen.  No acute osseous abnormalities  are identified. Sclerotic change is noted at the right sacroiliac joint.  IMPRESSION: 1. No definite acute abnormality seen to explain the patient's symptoms. 2. Slight wall thickening along the distal ileum, within the pelvis, may reflect chronic sequelae of inflammation. The terminal ileum is unremarkable. 3. 3.2 cm adnexal cystic lesion is likely physiologic, though pelvic ultrasound could be considered for further evaluation, on an elective nonemergent basis.   Electronically Signed By: Garald Balding M.D. On: 02/16/2015 02:47          INITIAL IMPRESSION / ASSESSMENT AND PLAN / ED COURSE  Pertinent labs & imaging results that were available during my care of the patient were reviewed by me and considered in my medical decision making (see chart for details).  Patient referred to GYN regarding ovarian cysts on the left in addition patient referred to Dr. Bernardo Heater for outpatient evaluation of urinary frequency.  ____________________________________________   FINAL CLINICAL IMPRESSION(S) / ED DIAGNOSES  Final diagnoses:  Left flank  pain  Left ovarian cyst  Urinary frequency      Gregor Hams, MD 02/16/15 [phone]

## 2015-02-16 NOTE — Discharge Instructions (Signed)
Ovarian Cyst An ovarian cyst is a fluid-filled sac that forms on an ovary. The ovaries are small organs that produce eggs in women. Various types of cysts can form on the ovaries. Most are not cancerous. Many do not cause problems, and they often go away on their own. Some may cause symptoms and require treatment. Common types of ovarian cysts include:  Functional cysts--These cysts may occur every month during the menstrual cycle. This is normal. The cysts usually go away with the next menstrual cycle if the woman does not get pregnant. Usually, there are no symptoms with a functional cyst.  Endometrioma cysts--These cysts form from the tissue that lines the uterus. They are also called "chocolate cysts" because they become filled with blood that turns Shirley [name]. This type of cyst can cause pain in the lower abdomen during intercourse and with your menstrual period.  Cystadenoma cysts--This type develops from the cells on the outside of the ovary. These cysts can get very big and cause lower abdomen pain and pain with intercourse. This type of cyst can twist on itself, cut off its blood supply, and cause severe pain. It can also easily rupture and cause a lot of pain.  Dermoid cysts--This type of cyst is sometimes found in both ovaries. These cysts may contain different kinds of body tissue, such as skin, teeth, hair, or cartilage. They usually do not cause symptoms unless they get very big.  Theca lutein cysts--These cysts occur when too much of a certain hormone (human chorionic gonadotropin) is produced and overstimulates the ovaries to produce an egg. This is most common after procedures used to assist with the conception of a baby (in vitro fertilization). CAUSES   Fertility drugs can cause a condition in which multiple large cysts are formed on the ovaries. This is called ovarian hyperstimulation syndrome.  A condition called polycystic ovary syndrome can cause hormonal imbalances that can lead to  nonfunctional ovarian cysts. SIGNS AND SYMPTOMS  Many ovarian cysts do not cause symptoms. If symptoms are present, they may include:  Pelvic pain or pressure.  Pain in the lower abdomen.  Pain during sexual intercourse.  Increasing girth (swelling) of the abdomen.  Abnormal menstrual periods.  Increasing pain with menstrual periods.  Stopping having menstrual periods without being pregnant. DIAGNOSIS  These cysts are commonly found during a routine or annual pelvic exam. Tests may be ordered to find out more about the cyst. These tests may include:  Ultrasound.  X-ray of the pelvis.  CT scan.  MRI.  Blood tests. TREATMENT  Many ovarian cysts go away on their own without treatment. Your health care provider may want to check your cyst regularly for 2-3 months to see if it changes. For women in menopause, it is particularly important to monitor a cyst closely because of the higher rate of ovarian cancer in menopausal women. When treatment is needed, it may include any of the following:  A procedure to drain the cyst (aspiration). This may be done using a long needle and ultrasound. It can also be done through a laparoscopic procedure. This involves using a thin, lighted tube with a tiny camera on the end (laparoscope) inserted through a small incision.  Surgery to remove the whole cyst. This may be done using laparoscopic surgery or an open surgery involving a larger incision in the lower abdomen.  Hormone treatment or birth control pills. These methods are sometimes used to help dissolve a cyst. HOME CARE INSTRUCTIONS   Only take over-the-counter  or prescription medicines as directed by your health care provider.  Follow up with your health care provider as directed.  Get regular pelvic exams and Pap tests. SEEK MEDICAL CARE IF:   Your periods are late, irregular, or painful, or they stop.  Your pelvic pain or abdominal pain does not go away.  Your abdomen becomes  larger or swollen.  You have pressure on your bladder or trouble emptying your bladder completely.  You have pain during sexual intercourse.  You have feelings of fullness, pressure, or discomfort in your stomach.  You lose weight for no apparent reason.  You feel generally ill.  You become constipated.  You lose your appetite.  You develop acne.  You have an increase in body and facial hair.  You are gaining weight, without changing your exercise and eating habits.  You think you are pregnant. SEEK IMMEDIATE MEDICAL CARE IF:   You have increasing abdominal pain.  You feel sick to your stomach (nauseous), and you throw up (vomit).  You develop a fever that comes on suddenly.  You have abdominal pain during a bowel movement.  Your menstrual periods become heavier than usual. MAKE SURE YOU:  Understand these instructions.  Will watch your condition.  Will get help right away if you are not doing well or get worse. Document Released: 07/14/2005 Document Revised: 07/19/2013 Document Reviewed: 03/21/2013 [facility] Patient Information 2015 [place], [place]. This information is not intended to replace advice given to you by your health care provider. Make sure you discuss any questions you have with your health care provider.  Urinary Frequency The number of times a normal person urinates depends upon how much liquid they take in and how much liquid they are losing. If the temperature is hot and there is high humidity, then the person will sweat more and usually breathe a little more frequently. These factors decrease the amount of frequency of urination that would be considered normal. The amount you drink is easily determined, but the amount of fluid lost is sometimes more difficult to calculate.  Fluid is lost in two ways:  Sensible fluid loss is usually measured by the amount of urine that you get rid of. Losses of fluid can also occur with diarrhea.  Insensible fluid  loss is more difficult to measure. It is caused by evaporation. Insensible loss of fluid occurs through breathing and sweating. It usually ranges from a little less than a quart to a little more than a quart of fluid a day. In normal temperatures and activity levels, the average person may urinate 4 to 7 times in a 24-hour period. Needing to urinate more often than that could indicate a problem. If one urinates 4 to 7 times in 24 hours and has large volumes each time, that could indicate a different problem from one who urinates 4 to 7 times a day and has small volumes. The time of urinating is also important. Most urinating should be done during the waking hours. Getting up at night to urinate frequently can indicate some problems. CAUSES  The bladder is the organ in your lower abdomen that holds urine. Like a balloon, it swells some as it fills up. Your nerves sense this and tell you it is time to head for the bathroom. There are a number of reasons that you might feel the need to urinate more often than usual. They include:  Urinary tract infection. This is usually associated with other signs such as burning when you urinate.  In men, problems with the prostate (a walnut-size gland that is located near the tube that carries urine out of your body). There are two reasons why the prostate can cause an increased frequency of urination:  An enlarged prostate that does not let the bladder empty well. If the bladder only half empties when you urinate, then it only has half the capacity to fill before you have to urinate again.  The nerves in the bladder become more hypersensitive with an increased size of the prostate even if the bladder empties completely.  Pregnancy.  Obesity. Excess weight is more likely to cause a problem for women than for men.  Bladder stones or other bladder problems.  Caffeine.  Alcohol.  Medications. For example, drugs that help the body get rid of extra fluid  (diuretics) increase urine production. Some other medicines must be taken with lots of fluids.  Muscle or nerve weakness. This might be the result of a spinal cord injury, a stroke, multiple sclerosis, or Parkinson disease.  Long-standing diabetes can decrease the sensation of the bladder. This loss of sensation makes it harder to sense the bladder needs to be emptied. Over a period of years, the bladder is stretched out by constant overfilling. This weakens the bladder muscles so that the bladder does not empty well and has less capacity to fill with new urine.  Interstitial cystitis (also called painful bladder syndrome). This condition develops because the tissues that line the inside of the bladder are inflamed (inflammation is the body's way of reacting to injury or infection). It causes pain and frequent urination. It occurs in women more often than in men. DIAGNOSIS   To decide what might be causing your urinary frequency, your health care provider will probably:  Ask about symptoms you have noticed.  Ask about your overall health. This will include questions about any medications you are taking.  Do a physical examination.  Order some tests. These might include:  A blood test to check for diabetes or other health issues that could be contributing to the problem.  Urine testing. This could measure the flow of urine and the pressure on the bladder.  A test of your neurological system (the brain, spinal cord, and nerves). This is the system that senses the need to urinate.  A bladder test to check whether it is emptying completely when you urinate.  Cystoscopy. This test uses a thin tube with a tiny camera on it. It offers a look inside your urethra and bladder to see if there are problems.  Imaging tests. You might be given a contrast dye and then asked to urinate. X-rays are taken to see how your bladder is working. TREATMENT  It is important for you to be evaluated to determine  if the amount or frequency that you have is unusual or abnormal. If it is found to be abnormal, the cause should be determined and this can usually be found out easily. Depending upon the cause, treatment could include medication, stimulation of the nerves, or surgery. There are not too many things that you can do as an individual to change your urinary frequency. It is important that you balance the amount of fluid intake needed to compensate for your activity and the temperature. Medical problems will be diagnosed and taken care of by your physician. There is no particular bladder training such as Kegel exercises that you can do to help urinary frequency. This is an exercise that is usually recommended for people who  have leaking of urine when they laugh, cough, or sneeze. HOME CARE INSTRUCTIONS   Take any medications your health care provider prescribed or suggested. Follow the directions carefully.  Practice any lifestyle changes that are recommended. These might include:  Drinking less fluid or drinking at different times of the day. If you need to urinate often during the night, for example, you may need to stop drinking fluids early in the evening.  Cutting down on caffeine or alcohol. They both can make you need to urinate more often than normal. Caffeine is found in coffee, tea, and sodas.  Losing weight, if that is recommended.  Keep a journal or a log. You might be asked to record how much you drink and when and where you feel the need to urinate. This will also help evaluate how well the treatment provided by your physician is working. SEEK MEDICAL CARE IF:   Your need to urinate often gets worse.  You feel increased pain or irritation when you urinate.  You notice blood in your urine.  You have questions about any medications that your health care provider recommended.  You notice blood, pus, or swelling at the site of any test or treatment procedure.  You develop a fever of  more than 100.15F (38.1C). SEEK IMMEDIATE MEDICAL CARE IF:  You develop a fever of more than 102.75F (38.9C). Document Released: 05/10/2009 Document Revised: 11/28/2013 Document Reviewed: 05/10/2009 [facility] Patient Information 2015 [place], [place]. This information is not intended to replace advice given to you by your health care provider. Make sure you discuss any questions you have with your health care provider.

## 2015-03-28 ENCOUNTER — Ambulatory Visit
Admission: EM | Admit: 2015-03-28 | Discharge: 2015-03-28 | Disposition: A | Discharge status: Home / Self Care | Payer: Medicaid Other | Attending: Emergency Medicine | Admitting: Emergency Medicine

## 2015-03-28 DIAGNOSIS — I341 Nonrheumatic mitral (valve) prolapse: Secondary | ICD-10-CM | POA: Diagnosis not present

## 2015-03-28 DIAGNOSIS — R300 Dysuria: Secondary | ICD-10-CM | POA: Insufficient documentation

## 2015-03-28 DIAGNOSIS — F419 Anxiety disorder, unspecified: Secondary | ICD-10-CM | POA: Diagnosis not present

## 2015-03-28 DIAGNOSIS — R319 Hematuria, unspecified: Secondary | ICD-10-CM | POA: Diagnosis not present

## 2015-03-28 DIAGNOSIS — N3090 Cystitis, unspecified without hematuria: Secondary | ICD-10-CM

## 2015-03-28 DIAGNOSIS — N3091 Cystitis, unspecified with hematuria: Secondary | ICD-10-CM | POA: Insufficient documentation

## 2015-03-28 DIAGNOSIS — F909 Attention-deficit hyperactivity disorder, unspecified type: Secondary | ICD-10-CM | POA: Diagnosis not present

## 2015-03-28 DIAGNOSIS — Z79899 Other long term (current) drug therapy: Secondary | ICD-10-CM | POA: Insufficient documentation

## 2015-03-28 DIAGNOSIS — R1030 Lower abdominal pain, unspecified: Secondary | ICD-10-CM | POA: Diagnosis present

## 2015-03-28 LAB — URINALYSIS COMPLETE WITH MICROSCOPIC (ARMC ONLY)
BILIRUBIN URINE: NEGATIVE
Bacteria, UA: NONE SEEN — AB
GLUCOSE, UA: NEGATIVE mg/dL
Ketones, ur: NEGATIVE mg/dL
LEUKOCYTES UA: NEGATIVE
NITRITE: NEGATIVE
Protein, ur: NEGATIVE mg/dL
Specific Gravity, Urine: 1.02 (ref 1.005–1.030)
pH: 5.5 (ref 5.0–8.0)

## 2015-03-28 MED ORDER — CLINDAMYCIN HCL 300 MG PO CAPS: 300.0000 mg | ORAL_CAPSULE | Freq: Four times a day (QID) | ORAL | Status: DC

## 2015-03-28 MED ORDER — PHENAZOPYRIDINE HCL 200 MG PO TABS: 200.0000 mg | ORAL_TABLET | Freq: Three times a day (TID) | ORAL | Status: DC | PRN

## 2015-03-28 MED ORDER — HYDROCODONE-ACETAMINOPHEN 5-325 MG PO TABS: 2.0000 | ORAL_TABLET | ORAL | Status: DC | PRN

## 2015-03-28 MED ORDER — IBUPROFEN 800 MG PO TABS: 800.0000 mg | ORAL_TABLET | Freq: Four times a day (QID) | ORAL | Status: DC | PRN

## 2015-03-28 NOTE — Discharge Instructions (Signed)
Call here in several days for your urine culture results. Start the clindamycin now. Discontinue clindamycin if urine culture is negative for a urinary tract infection. Start taking a probiotic while you're taking the antibiotic. drink plenty of extra fluids.

## 2015-03-28 NOTE — ED Provider Notes (Signed)
HPI  SUBJECTIVE:  Shirley [name] is a 44 y.o. female who presents with 3-4 days of lower abdominal pain described as soreness, bilateral nonradiating nonmigratory back pain described as dull, pain, muscle pulling, cloudy, odorous, dark urine, dysuria, hematuria. No aggravating or alleviating factors. She has been straining her urine and states that she's been catching stones, she has increased her fluids including cranberry juice. No nausea, vomiting, fevers, abdominal distention, other abdominal pain. She notes vaginal discharge, and states that she tried Monistat for this, but she has also started back on OCPs. She denies genital rash, vaginal odor, vulvar itching, vaginal bleeding. States that she has not been sexually active in 4 years. Patient has been seen several times here and in the ER for the same, was recently diagnosed with kidney stones on ultrasound done in her PMDs office. She states that she has had evaluation for bladder dysfunction, but that this was done long time ago. She has follow-up arranged with urology in September. Past medical history of recurrent, chronic UTIs, nephrolithiasis, BV, yeast. No history of trach, gonorrhea, chlamydia, herpes, HIV, syphilis. Family history of bladder and kidney cancer.  Past Medical History  Diagnosis Date  . ADHD (attention deficit hyperactivity disorder)   . Mitral valve prolapse   . UTI (urinary tract infection)   . Spina bifida occulta   . Anxiety     Past Surgical History  Procedure Laterality Date  . Leep    . Leep      Family History  Problem Relation Age of Onset  . Cancer Mother   . Cancer Father     Social History  Substance Use Topics  . Smoking status: Never Smoker   . Smokeless tobacco: None  . Alcohol Use: No    No current facility-administered medications for this encounter.  Current outpatient prescriptions:  .  ALPRAZolam (XANAX) 1 MG tablet, Take 1 mg by mouth 3 (three) times daily as needed for anxiety.,  Disp: , Rfl:  .  amphetamine-dextroamphetamine (ADDERALL) 10 MG tablet, Take 10 mg by mouth 2 (two) times daily., Disp: , Rfl:  .  fluticasone (FLONASE) 50 MCG/ACT nasal spray, Place 2 sprays into both nostrils daily as needed., Disp: , Rfl: 5 .  Norgestimate-Ethinyl Estradiol Triphasic (TRI-SPRINTEC) 0.18/0.215/0.25 MG-35 MCG tablet, Take 1 tablet by mouth daily., Disp: , Rfl:  .  clindamycin (CLEOCIN) 300 MG capsule, Take 1 capsule (300 mg total) by mouth 4 (four) times daily. X 10 days, Disp: 40 capsule, Rfl: 0 .  HYDROcodone-acetaminophen (NORCO/VICODIN) 5-325 MG per tablet, Take 2 tablets by mouth every 4 (four) hours as needed for moderate pain., Disp: 20 tablet, Rfl: 0 .  ibuprofen (ADVIL,MOTRIN) 800 MG tablet, Take 1 tablet (800 mg total) by mouth every 6 (six) hours as needed., Disp: 30 tablet, Rfl: 0 .  phenazopyridine (PYRIDIUM) 200 MG tablet, Take 1 tablet (200 mg total) by mouth 3 (three) times daily as needed for pain., Disp: 6 tablet, Rfl: 0  Allergies  Allergen Reactions  . Bactrim [Sulfamethoxazole-Trimethoprim] Anaphylaxis  . Gadolinium Derivatives Anaphylaxis  . Iodinated Diagnostic Agents Anaphylaxis  . Other Nausea And Vomiting and Rash    Uncoded Allergy. Allergen: BEZONZTATE Uncoded Allergy. Allergen: decongestants  . Sulfa Antibiotics Anaphylaxis  . Azithromycin Hives  . Clarithromycin Other (See Comments)  . Furosemide Other (See Comments)  . Guaifenesin Nausea And Vomiting  . Macrobid [Nitrofurantoin Monohyd Macro] Swelling    Tongue swelling, chest tightness   . Nitrofurantoin Hives  . Penicillins Hives  .  Amoxicillin Rash  . Doxycycline Rash and Other (See Comments)  . Keflex [Cephalexin] Rash and Other (See Comments)  . Levofloxacin Rash     ROS  As noted in HPI.   Physical Exam  BP 124/79 mmHg  Temp(Src) 98.2 F (36.8 C) (Oral)  Resp 16  Ht 5\' 6"  (1.676 m)  Wt 175 lb (79.379 kg)  BMI 28.26 kg/m2  SpO2 100%  LMP 02/28/2015  Constitutional:  Well developed, well nourished, no acute distress Eyes: PERRL, EOMI, conjunctiva normal bilaterally HENT: Normocephalic, atraumatic,mucus membranes moist Respiratory: Clear to auscultation bilaterally, no rales, no wheezing, no rhonchi Cardiovascular: Normal rate and rhythm, no murmurs, no gallops, no rubs GI: Soft, nondistended, normal bowel sounds, +  suprapubic tenderness.  no flank tenderness no periumbilical tenderness negative Murphy negative McBurney, no rebound, no guarding Back: Bilateral mild CVAT skin: No rash, skin intact Musculoskeletal: No edema, no tenderness, no deformities Neurologic: Alert & oriented x 3, CN II-XII grossly intact, no motor deficits, sensation grossly intact Psychiatric: Speech and behavior appropriate   ED Course   Medications - No data to display  Orders Placed This Encounter  Procedures  . Urine culture    Standing Status: Standing     Number of Occurrences: 1     Standing Expiration Date:     Order Specific Question:  Patient immune status    Answer:  Normal  . Urinalysis complete, with microscopic    Standing Status: Standing     Number of Occurrences: 1     Standing Expiration Date:    Results for orders placed or performed during the hospital encounter of 03/28/15 (from the past 24 hour(s))  Urinalysis complete, with microscopic     Status: Abnormal   Collection Time: 03/28/15  3:30 PM  Result Value Ref Range   Color, Urine YELLOW YELLOW   APPearance CLEAR CLEAR   Glucose, UA NEGATIVE NEGATIVE mg/dL   Bilirubin Urine NEGATIVE NEGATIVE   Ketones, ur NEGATIVE NEGATIVE mg/dL   Specific Gravity, Urine 1.020 1.005 - 1.030   Hgb urine dipstick 2+ (A) NEGATIVE   pH 5.5 5.0 - 8.0   Protein, ur NEGATIVE NEGATIVE mg/dL   Nitrite NEGATIVE NEGATIVE   Leukocytes, UA NEGATIVE NEGATIVE   RBC / HPF 6-30 <3 RBC/hpf   WBC, UA 0-5 <3 WBC/hpf   Bacteria, UA NONE SEEN (A) RARE   Squamous Epithelial / LPF 6-30 (A) RARE   No results found.  ED  Clinical Impression  Dysuria  Hematuria  Cystitis   ED Assessment/Plan  [facility] narcotic database reviewed. No opiate prescriptions in the past 6 months. Prescription for Xanax only.   Patient had a CT on 02/16/15 that was negative for kidney stones. She had multiple urinalysis that was positive for hematuria. Since 01/11/2015 calcium oxalate crystals found on one of them.  Patient declined pelvic discussed with her that would not be able to rule out other gynecologic causes of her symptoms such as BV, yeast, Trichomonas, gonorrhea, chlamydia,  HSV. States that she has not been sexually active in 4 years. Feel that she is low risk for STDs. Patient states that she would like to try treatment for nephrolithiasis/presumed UTI prior to doing pelvic. States that this feels identical to previous UTIs. She states that this can be done her primary care physician's office.  No UTI on today's analysis. + hematuria. Suspect that her symptoms are from bladder irritation rather than infection, however given history of recurrent UTIs, suprapubic tenderness and CVA  tenderness, will start patient empirically on antibiotics and patient to discontinue them if her urine culture comes back negative. Also sent home with Pyridium, ibuprofen 800 mg, Flomax, Norco for presumed nephrolithiasis. Patient already has reliable follow-up with her PMD at family practice [facility] urology in September.  Patient has multiple antibiotic allergies including Bactrim Macrobid penicillin amoxicillin Keflex doxycycline, Levaquin. States that she has taken clindamycin in the past for her UTIs with success. Will Rx this to her today.    Discussed labs,  MDM, plan and followup with patient. Discussed sn/sx that should prompt return to the UC or ED. Patient  agrees with plan.   *This clinic note was created using Dragon dictation software. Therefore, there may be occasional mistakes despite careful proofreading.  ? Vitals  - 1 value per visit 03/28/2015 02/16/2015 9/37/1696 02/02/9380  SYSTOLIC 017 510 258 527  DIASTOLIC 79 71 80 75  Pulse  76 82 95  Temperature 98.2 98.7 97.8 98.8  Respirations 16 18 16 16   Weight (lb) 175 175 180 180  Height 5\' 6"  5\' 6"  5\' 5"  5\' 8"   BMI 28.26 28.26 29.95 27.38     Melynda Ripple, MD 03/29/15 1254

## 2015-03-28 NOTE — ED Notes (Signed)
Pt states "I am having kidney pain, I have chronic bladder problems. I was recently diagnosed with kidney stones. I have to follow up with urologist in a few weeks. I have an appt with PCP next week."

## 2015-04-04 ENCOUNTER — Telehealth: Payer: Self-pay

## 2015-04-04 NOTE — ED Notes (Signed)
Pt wanted urine culture results, however it appears that culture was not done. Will consult provider for advice. Pt states she feels the same, and her normal is urinary problems. She has an appointment with Urologist in 2 weeks.

## 2015-04-05 ENCOUNTER — Ambulatory Visit
Admission: RE | Admit: 2015-04-05 | Discharge: 2015-04-05 | Disposition: A | Discharge status: Home / Self Care | Payer: Medicaid Other | Source: Ambulatory Visit | Attending: Family Medicine | Admitting: Family Medicine

## 2015-04-05 ENCOUNTER — Ambulatory Visit
Admission: EM | Admit: 2015-04-05 | Discharge: 2015-04-05 | Disposition: A | Discharge status: Home / Self Care | Payer: Medicaid Other | Attending: Family Medicine | Admitting: Family Medicine

## 2015-04-05 ENCOUNTER — Encounter: Payer: Self-pay | Admitting: Emergency Medicine

## 2015-04-05 DIAGNOSIS — S0093XA Contusion of unspecified part of head, initial encounter: Secondary | ICD-10-CM | POA: Insufficient documentation

## 2015-04-05 DIAGNOSIS — R42 Dizziness and giddiness: Secondary | ICD-10-CM | POA: Diagnosis not present

## 2015-04-05 DIAGNOSIS — R300 Dysuria: Secondary | ICD-10-CM | POA: Diagnosis not present

## 2015-04-05 DIAGNOSIS — W3189XA Contact with other specified machinery, initial encounter: Secondary | ICD-10-CM | POA: Diagnosis not present

## 2015-04-05 LAB — URINALYSIS COMPLETE WITH MICROSCOPIC (ARMC ONLY)
Bacteria, UA: NONE SEEN — AB
Bilirubin Urine: NEGATIVE
GLUCOSE, UA: NEGATIVE mg/dL
Ketones, ur: NEGATIVE mg/dL
Leukocytes, UA: NEGATIVE
NITRITE: NEGATIVE
PROTEIN: NEGATIVE mg/dL
pH: 6.5 (ref 5.0–8.0)

## 2015-04-05 LAB — PREGNANCY, URINE: Preg Test, Ur: NEGATIVE
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
## 2015-04-05 NOTE — ED Notes (Signed)
Pt with uti s/s

## 2015-04-05 NOTE — Discharge Instructions (Signed)
Dysuria Dysuria is the medical term for pain with urination. There are many causes for dysuria, but urinary tract infection is the most common. If a urinalysis was performed it can show that there is a urinary tract infection. A urine culture confirms that you or your child is sick. You will need to follow up with a healthcare provider because:  If a urine culture was done you will need to know the culture results and treatment recommendations.  If the urine culture was positive, you or your child will need to be put on antibiotics or know if the antibiotics prescribed are the right antibiotics for your urinary tract infection.  If the urine culture is negative (no urinary tract infection), then other causes may need to be explored or antibiotics need to be stopped. Today laboratory work may have been done and there does not seem to be an infection. If cultures were done they will take at least 24 to 48 hours to be completed. Today x-rays may have been taken and they read as normal. No cause can be found for the problems. The x-rays may be re-read by a radiologist and you will be contacted if additional findings are made. You or your child may have been put on medications to help with this problem until you can see your primary caregiver. If the problems get better, see your primary caregiver if the problems return. If you were given antibiotics (medications which kill germs), take all of the mediations as directed for the full course of treatment.  If laboratory work was done, you need to find the results. Leave a telephone number where you can be reached. If this is not possible, make sure you find out how you are to get test results. HOME CARE INSTRUCTIONS   Drink lots of fluids. For adults, drink eight, 8 ounce glasses of clear juice or water a day. For children, replace fluids as suggested by your caregiver.  Empty the bladder often. Avoid holding urine for long periods of time.  After a bowel  movement, women should cleanse front to back, using each tissue only once.  Empty your bladder before and after sexual intercourse.  Take all the medicine given to you until it is gone. You may feel better in a few days, but TAKE ALL MEDICINE.  Avoid caffeine, tea, alcohol and carbonated beverages, because they tend to irritate the bladder.  In men, alcohol may irritate the prostate.  Only take over-the-counter or prescription medicines for pain, discomfort, or fever as directed by your caregiver.  If your caregiver has given you a follow-up appointment, it is very important to keep that appointment. Not keeping the appointment could result in a chronic or permanent injury, pain, and disability. If there is any problem keeping the appointment, you must call back to this facility for assistance. SEEK IMMEDIATE MEDICAL CARE IF:   Back pain develops.  A fever develops.  There is nausea (feeling sick to your stomach) or vomiting (throwing up).  Problems are no better with medications or are getting worse. MAKE SURE YOU:   Understand these instructions.  Will watch your condition.  Will get help right away if you are not doing well or get worse. Document Released: 04/11/2004 Document Revised: 10/06/2011 Document Reviewed: 02/17/2008 [facility] Patient Information 2015 [place], [place]. This information is not intended to replace advice given to you by your health care provider. Make sure you discuss any questions you have with your health care provider.  Blunt Trauma You  have been evaluated for injuries. You have been examined and your caregiver has not found injuries serious enough to require hospitalization. It is common to have multiple bruises and sore muscles following an accident. These tend to feel worse for the first 24 hours. You will feel more stiffness and soreness over the next several hours and worse when you wake up the first morning after your accident. After this point, you  should begin to improve with each passing day. The amount of improvement depends on the amount of damage done in the accident. Following your accident, if some part of your body does not work as it should, or if the pain in any area continues to increase, you should return to the Emergency Department for re-evaluation.  HOME CARE INSTRUCTIONS  Routine care for sore areas should include:  Ice to sore areas every 2 hours for 20 minutes while awake for the next 2 days.  Drink extra fluids (not alcohol).  Take a hot or warm shower or bath once or twice a day to increase blood flow to sore muscles. This will help you "limber up".  Activity as tolerated. Lifting may aggravate neck or back pain.  Only take over-the-counter or prescription medicines for pain, discomfort, or fever as directed by your caregiver. Do not use aspirin. This may increase bruising or increase bleeding if there are small areas where this is happening. SEEK IMMEDIATE MEDICAL CARE IF:  Numbness, tingling, weakness, or problem with the use of your arms or legs.  A severe headache is not relieved with medications.  There is a change in bowel or bladder control.  Increasing pain in any areas of the body.  Short of breath or dizzy.  Nauseated, vomiting, or sweating.  Increasing belly (abdominal) discomfort.  Blood in urine, stool, or vomiting blood.  Pain in either shoulder in an area where a shoulder strap would be.  Feelings of lightheadedness or if you have a fainting episode. Sometimes it is not possible to identify all injuries immediately after the trauma. It is important that you continue to monitor your condition after the emergency department visit. If you feel you are not improving, or improving more slowly than should be expected, call your physician. If you feel your symptoms (problems) are worsening, return to the Emergency Department immediately. Document Released: 04/09/2001 Document Revised: 10/06/2011  Document Reviewed: 03/01/2008 [facility] Patient Information 2015 [place], [place]. This information is not intended to replace advice given to you by your health care provider. Make sure you discuss any questions you have with your health care provider.   Contusion A contusion is a deep bruise. Contusions happen when an injury causes bleeding under the skin. Signs of bruising include pain, puffiness (swelling), and discolored skin. The contusion may turn blue, purple, or yellow. HOME CARE   Put ice on the injured area.  Put ice in a plastic bag.  Place a towel between your skin and the bag.  Leave the ice on for 15-20 minutes, 03-04 times a day.  Only take medicine as told by your doctor.  Rest the injured area.  If possible, raise (elevate) the injured area to lessen puffiness. GET HELP RIGHT AWAY IF:   You have more bruising or puffiness.  You have pain that is getting worse.  Your puffiness or pain is not helped by medicine. MAKE SURE YOU:   Understand these instructions.  Will watch your condition.  Will get help right away if you are not doing well or get  worse. Document Released: 12/31/2007 Document Revised: 10/06/2011 Document Reviewed: 05/19/2011 [facility] Patient Information 2015 [place], [place]. This information is not intended to replace advice given to you by your health care provider. Make sure you discuss any questions you have with your health care provider.  Head Injury You have a head injury. Headaches and throwing up (vomiting) are common after a head injury. It should be easy to wake up from sleeping. Sometimes you must stay in the hospital. Most problems happen within the first 24 hours. Side effects may occur up to 7-10 days after the injury.  WHAT ARE THE TYPES OF HEAD INJURIES? Head injuries can be as minor as a bump. Some head injuries can be more severe. More severe head injuries include:  A jarring injury to the brain (concussion).  A bruise of the  brain (contusion). This mean there is bleeding in the brain that can cause swelling.  A cracked skull (skull fracture).  Bleeding in the brain that collects, clots, and forms a bump (hematoma). WHEN SHOULD I GET HELP RIGHT AWAY?   You are confused or sleepy.  You cannot be woken up.  You feel sick to your stomach (nauseous) or keep throwing up (vomiting).  Your dizziness or unsteadiness is getting worse.  You have very bad, lasting headaches that are not helped by medicine. Take medicines only as told by your doctor.  You cannot use your arms or legs like normal.  You cannot walk.  You notice changes in the black spots in the center of the colored part of your eye (pupil).  You have clear or bloody fluid coming from your nose or ears.  You have trouble seeing. During the next 24 hours after the injury, you must stay with someone who can watch you. This person should get help right away (call 911 in the [place] if you start to shake and are not able to control it (have seizures), you pass out, or you are unable to wake up. HOW CAN I PREVENT A HEAD INJURY IN THE FUTURE?  Wear seat belts.  Wear a helmet while bike riding and playing sports like football.  Stay away from dangerous activities around the house. WHEN CAN I RETURN TO NORMAL ACTIVITIES AND ATHLETICS? See your doctor before doing these activities. You should not do normal activities or play contact sports until 1 week after the following symptoms have stopped:  Headache that does not go away.  Dizziness.  Poor attention.  Confusion.  Memory problems.  Sickness to your stomach or throwing up.  Tiredness.  Fussiness.  Bothered by bright lights or loud noises.  Anxiousness or depression.  Restless sleep. MAKE SURE YOU:   Understand these instructions.  Will watch your condition.  Will get help right away if you are not doing well or get worse. Document Released: 06/26/2008 Document Revised:  11/28/2013 Document Reviewed: 03/21/2013 [facility] Patient Information 2015 [place], [place]. This information is not intended to replace advice given to you by your health care provider. Make sure you discuss any questions you have with your health care provider.

## 2015-04-07 LAB — URINE CULTURE: Culture: 60000

## 2015-04-09 ENCOUNTER — Telehealth: Payer: Self-pay | Admitting: Emergency Medicine

## 2015-04-09 NOTE — ED Notes (Signed)
Patient was notified of her urine culture results and to finish her Clindamycin. Patient verbalized understanding. Patient states that she has a follow-up appointment with a Urologist on Sept. 26.

## 2015-04-19 ENCOUNTER — Ambulatory Visit
Admission: EM | Admit: 2015-04-19 | Discharge: 2015-04-19 | Disposition: A | Discharge status: Home / Self Care | Payer: Medicaid Other | Attending: Family Medicine | Admitting: Family Medicine

## 2015-04-19 ENCOUNTER — Encounter: Payer: Self-pay | Admitting: Emergency Medicine

## 2015-04-19 DIAGNOSIS — F909 Attention-deficit hyperactivity disorder, unspecified type: Secondary | ICD-10-CM | POA: Diagnosis not present

## 2015-04-19 DIAGNOSIS — E876 Hypokalemia: Secondary | ICD-10-CM | POA: Diagnosis not present

## 2015-04-19 DIAGNOSIS — R300 Dysuria: Secondary | ICD-10-CM

## 2015-04-19 DIAGNOSIS — F419 Anxiety disorder, unspecified: Secondary | ICD-10-CM | POA: Insufficient documentation

## 2015-04-19 DIAGNOSIS — I341 Nonrheumatic mitral (valve) prolapse: Secondary | ICD-10-CM | POA: Diagnosis not present

## 2015-04-19 LAB — CBC WITH DIFFERENTIAL/PLATELET
BASOS ABS: 0.1 10*3/uL (ref 0–0.1)
Basophils Relative: 1 %
EOS PCT: 3 %
Eosinophils Absolute: 0.3 10*3/uL (ref 0–0.7)
HCT: 35.3 % (ref 35.0–47.0)
Hemoglobin: 12 g/dL (ref 12.0–16.0)
LYMPHS PCT: 30 %
Lymphs Abs: 2.9 10*3/uL (ref 1.0–3.6)
MCH: 29.2 pg (ref 26.0–34.0)
MCHC: 34 g/dL (ref 32.0–36.0)
MCV: 85.9 fL (ref 80.0–100.0)
Monocytes Absolute: 0.7 10*3/uL (ref 0.2–0.9)
Monocytes Relative: 7 %
Neutro Abs: 5.9 10*3/uL (ref 1.4–6.5)
Neutrophils Relative %: 59 %
PLATELETS: 274 10*3/uL (ref 150–440)
RBC: 4.11 MIL/uL (ref 3.80–5.20)
RDW: 13.1 % (ref 11.5–14.5)
WBC: 9.9 10*3/uL (ref 3.6–11.0)

## 2015-04-19 LAB — COMPREHENSIVE METABOLIC PANEL
ALBUMIN: 3.9 g/dL (ref 3.5–5.0)
ALT: 13 U/L — AB (ref 14–54)
AST: 15 U/L (ref 15–41)
Alkaline Phosphatase: 67 U/L (ref 38–126)
Anion gap: 10 (ref 5–15)
BUN: 8 mg/dL (ref 6–20)
CHLORIDE: 104 mmol/L (ref 101–111)
CO2: 24 mmol/L (ref 22–32)
CREATININE: 0.69 mg/dL (ref 0.44–1.00)
Calcium: 8.8 mg/dL — ABNORMAL LOW (ref 8.9–10.3)
GFR calc Af Amer: >60 mL/min (ref >60)
GLUCOSE: 75 mg/dL (ref 65–99)
POTASSIUM: 3.1 mmol/L — AB (ref 3.5–5.1)
SODIUM: 138 mmol/L (ref 135–145)
Total Bilirubin: 0.3 mg/dL (ref 0.3–1.2)
Total Protein: 7.2 g/dL (ref 6.5–8.1)

## 2015-04-19 LAB — URINALYSIS COMPLETE WITH MICROSCOPIC (ARMC ONLY)
Bilirubin Urine: NEGATIVE
Glucose, UA: NEGATIVE mg/dL
KETONES UR: NEGATIVE mg/dL
LEUKOCYTES UA: NEGATIVE
Nitrite: NEGATIVE
PH: 6 (ref 5.0–8.0)
PROTEIN: NEGATIVE mg/dL
SPECIFIC GRAVITY, URINE: 1.005 (ref 1.005–1.030)

## 2015-04-19 MED ORDER — POTASSIUM CHLORIDE CRYS ER 20 MEQ PO TBCR: 20.0000 meq | EXTENDED_RELEASE_TABLET | Freq: Once | ORAL | Status: DC

## 2015-04-19 MED ORDER — POTASSIUM CHLORIDE CRYS ER 20 MEQ PO TBCR
20.0000 meq | EXTENDED_RELEASE_TABLET | Freq: Two times a day (BID) | ORAL | Status: DC
Start: 2015-04-19 — End: 2015-04-19

## 2015-04-19 MED ORDER — POTASSIUM CHLORIDE ER 20 MEQ PO TBCR: 20.0000 meq | EXTENDED_RELEASE_TABLET | Freq: Once | ORAL | Status: DC

## 2015-04-19 MED ORDER — POTASSIUM CHLORIDE CRYS ER 20 MEQ PO TBCR: 40.0000 meq | EXTENDED_RELEASE_TABLET | Freq: Once | ORAL | Status: DC

## 2015-04-19 MED ORDER — PHENAZOPYRIDINE HCL 200 MG PO TABS
200.0000 mg | ORAL_TABLET | Freq: Three times a day (TID) | ORAL | Status: DC
Start: 2015-04-19 — End: 2016-04-25

## 2015-04-19 NOTE — ED Notes (Signed)
uti s/s

## 2015-04-19 NOTE — ED Provider Notes (Signed)
CSN: [id number]     Arrival date & time 04/19/15  4098 History   First MD Initiated Contact with Patient 04/19/15 [phone]     Chief Complaint  Patient presents with  . Recurrent UTI   (Consider location/radiation/quality/duration/timing/severity/associated sxs/prior Treatment) HPI Comments: Single caucasian female here for evaluation of dysuria and low back pain lumbar left greater than right; urinary frequency, burning, suprapubic discomfort, bloating.  Stopped clindamycin due to diarrhea after 3 days.  History of urinary catheter after second pregnancy x 6 months due to unable to void.  Patient reported urology appt 26 Apr 2015 pending.  Has had CT scan for kidney stones this past year found adnexal mass.  Mother with ovarian cancer mets.  Aunt with kidney cancer.  MGF and Father with prostate cancer.  Fever 100.3 last night tylenol 2 tabs po x 2 doses last night and this am.  Chills overnight.  Woke up to pee overnight.    The history is provided by the patient.    Past Medical History  Diagnosis Date  . ADHD (attention deficit hyperactivity disorder)   . Mitral valve prolapse   . UTI (urinary tract infection)   . Spina bifida occulta   . Anxiety    Past Surgical History  Procedure Laterality Date  . Leep    . Leep     Family History  Problem Relation Age of Onset  . Cancer Mother   . Cancer Father    Social History  Substance Use Topics  . Smoking status: Never Smoker   . Smokeless tobacco: None  . 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 Negative for diaphoresis, activity change, appetite change and unexpected weight change.  HENT: Negative for congestion, dental problem, drooling, ear discharge,  ear pain, facial swelling, hearing loss, mouth sores, nosebleeds, postnasal drip, sinus pressure, sneezing, sore throat, trouble swallowing and voice change.   Eyes: Negative for photophobia, pain, discharge, redness, itching and visual disturbance.  Respiratory: Negative for cough, shortness of breath, wheezing and stridor.   Cardiovascular: Negative for chest pain, palpitations and leg swelling.  Gastrointestinal: Positive for diarrhea and abdominal distention. Negative for nausea, vomiting, abdominal pain, constipation and blood in stool.  Endocrine: Negative for cold intolerance and heat intolerance.  Genitourinary: Positive for dysuria, urgency and frequency.  Musculoskeletal: Positive for back pain. Negative for myalgias, joint swelling, arthralgias, gait problem, neck pain and neck stiffness.  Skin: Negative for color change, pallor, rash and wound.  Allergic/Immunologic: Positive for environmental allergies. Negative for food allergies.  Neurological: Negative for dizziness, tremors, seizures, syncope, facial asymmetry, speech difficulty, weakness, light-headedness, numbness and headaches.  Hematological: Negative for adenopathy. Does not bruise/bleed easily.  Psychiatric/Behavioral: Positive for sleep disturbance. Negative for behavioral problems, confusion and agitation.    Allergies  Bactrim; Gadolinium derivatives; Iodinated diagnostic agents; Other; Sulfa antibiotics; Azithromycin; Clarithromycin; Furosemide; Guaifenesin; Macrobid; Nitrofurantoin; Penicillins; Amoxicillin; Doxycycline; Keflex; and Levofloxacin  Home Medications   Prior to Admission medications   Medication Sig Start Date End Date Taking? Authorizing Provider  ALPRAZolam Duanne Moron) 1 MG tablet Take 1 mg by mouth 3 (three) times daily as needed for  anxiety.    Historical Provider, MD  fluticasone (FLONASE) 50 MCG/ACT nasal spray Place 2 sprays into both nostrils daily as needed. 01/26/15   Historical Provider, MD   Norgestimate-Ethinyl Estradiol Triphasic (TRI-SPRINTEC) 0.18/0.215/0.25 MG-35 MCG tablet Take 1 tablet by mouth daily.    Historical Provider, MD  phenazopyridine (PYRIDIUM) 200 MG tablet Take 1 tablet (200 mg total) by mouth 3 (three) times daily. 04/19/15   Olen Cordial, NP   Meds Ordered and Administered this Visit   Medications  potassium chloride SA (K-DUR,KLOR-CON) CR tablet 40 mEq (not administered)  notified by RN Lauretta Grill patient departed prior to taking potassium 28meq po x 1 now.  Attempted to reach patient via telephone, no voicemail, phone message states customer not available try call again later.  Second dose potassium chloride 90meq sent to patient pharmacy for pick up. 20 Apr 2015 at 0821  BP 127/69 mmHg  Pulse 80  Temp(Src) 98 F (36.7 C) (Tympanic)  Resp 18  Ht 5\' 6"  (1.676 m)  Wt 175 lb (79.379 kg)  BMI 28.26 kg/m2  SpO2 99%  LMP 04/05/2015 No data found.   Physical Exam  Constitutional: She is oriented to person, place, and time. Vital signs are normal. She appears well-developed and well-nourished. No distress.  HENT:  Head: Normocephalic and atraumatic.  Right Ear: External ear normal.  Left Ear: External ear normal.  Nose: Nose normal.  Mouth/Throat: Oropharynx is clear and moist. No oropharyngeal exudate.  Eyes: Conjunctivae, EOM and lids are normal. Pupils are equal, round, and reactive to light. Right eye exhibits no discharge. Left eye exhibits no discharge. No scleral icterus.  Neck: Trachea normal and normal range of motion. Neck supple. No tracheal deviation present.  Cardiovascular: Normal rate, regular rhythm, normal heart sounds and intact distal pulses.  Exam reveals no gallop and no friction rub.   No murmur heard. Pulmonary/Chest: Effort normal and breath sounds normal. No stridor. No respiratory distress. She has no wheezes. She has no rales.  Abdominal: Soft. Bowel sounds are normal. She exhibits no shifting dullness, no  distension, no pulsatile liver, no fluid wave, no abdominal bruit, no ascites, no pulsatile midline mass and no mass. There is no hepatosplenomegaly. There is tenderness in the right lower quadrant and suprapubic area. There is no rigidity, no rebound, no guarding, no CVA tenderness, no tenderness at McBurney's point and negative Murphy's sign. Hernia confirmed negative in the ventral area.  Dull to percussion x 4 quads  Musculoskeletal: Normal range of motion. She exhibits no edema or tenderness.  Lymphadenopathy:    She has no cervical adenopathy.  Neurological: She is alert and oriented to person, place, and time. She exhibits normal muscle tone. Coordination normal.  Skin: Skin is warm, dry and intact. No rash noted. She is not diaphoretic. No erythema. No pallor.  Psychiatric: She has a normal mood and affect. Her speech is normal and behavior is normal. Judgment and thought content normal. Cognition and memory are normal.  Nursing note and vitals reviewed.   ED Course  Procedures (including critical care time)  Labs Review Labs Reviewed  URINALYSIS COMPLETEWITH MICROSCOPIC ([facility] ONLY) - Abnormal; Notable for the following:    Color, Urine STRAW (*)    Hgb urine dipstick 1+ (*)    Squamous Epithelial / LPF 0-5 (*)    All other components within normal limits  COMPREHENSIVE METABOLIC PANEL - Abnormal; Notable for the following:    Potassium 3.1 (*)    Calcium 8.8 (*)  ALT 13 (*)    All other components within normal limits  URINE CULTURE  CBC WITH DIFFERENTIAL/PLATELET   Urine culture  Status: Finalresult Visible to patient:  Not Released Nextappt: None       Notes Recorded by Jessee Avers, RN on 04/09/2015 at 9:31 AM Patient notified of results and to finish her Clindamycin. Notes Recorded by Jessee Avers, RN on 04/09/2015 at 7:05 AM Let staff know if further action is needed. Notes Recorded by Jessee Avers, RN on 04/06/2015 at 1:31  PM Result pending     2wk ago    Specimen Description URINE, CLEAN CATCH   Special Requests none   Culture 60,000 COLONIES/ml GROUP B STREP(S.AGALACTIAE)ISOLATED  Virtually 100% of S. agalactiae (Group B) strains are susceptible to Penicillin. For Penicillin-allergic patients, Erythromycin (85-95% sensitive) and Clindamycin (80% sensitive) are drugs of choice. Contact microbiology lab to request sensitivities if  needed within 7 days.  MIXED BACTERIAL ORGANISMS       Report Status 04/07/2015 FINAL   Resulting Agency [facility]      Specimen Collected: 04/05/15 12:48 PM Last Resulted: 04/07/15 1:39 PM       Has had 3 positive urine cultures for strep agalactiae in previous 18 months and all others insignificant growth or contaminated.  Case discussed with Dr Alveta Heimlich.  Discussed chart review findings with patient also at 1100.  CT RENAL STONE STUDY   Status: Final result       PACS Images     Show images for CT RENAL STONE STUDY     Study Result     CLINICAL DATA: Acute onset of left flank pain. Initial encounter.  EXAM: CT ABDOMEN AND PELVIS WITHOUT CONTRAST  TECHNIQUE: Multidetector CT imaging of the abdomen and pelvis was performed following the standard protocol without IV contrast.  COMPARISON: Right-sided abdominal ultrasound performed 12/29/2011  FINDINGS: The visualized lung bases are clear.  The liver and spleen are unremarkable in appearance. The gallbladder is within normal limits. The pancreas and adrenal glands are unremarkable.  The kidneys are unremarkable in appearance. There is no evidence of hydronephrosis. No renal or ureteral stones are seen. No perinephric stranding is appreciated.  No free fluid is identified. Slight wall thickening along the distal ileum, within the pelvis, may reflect chronic sequelae of inflammation. The small bowel is otherwise unremarkable. The terminal ileum is unremarkable in appearance. The  stomach is within normal limits. No acute vascular abnormalities are seen.  The appendix is normal in caliber, without evidence of appendicitis. The colon is unremarkable in appearance.  The bladder is mildly distended and grossly unremarkable. The uterus is grossly unremarkable in appearance. A 3.2 cm right adnexal cystic lesion is noted. The left ovary is unremarkable in appearance. No inguinal lymphadenopathy is seen.  No acute osseous abnormalities are identified. Sclerotic change is noted at the right sacroiliac joint.  IMPRESSION: 1. No definite acute abnormality seen to explain the patient's symptoms. 2. Slight wall thickening along the distal ileum, within the pelvis, may reflect chronic sequelae of inflammation. The terminal ileum is unremarkable. 3. 3.2 cm adnexal cystic lesion is likely physiologic, though pelvic ultrasound could be considered for further evaluation, on an elective nonemergent basis.   Electronically Signed  By: Garald Balding M.D.  On: 02/16/2015 02:47   1113 Discussed urinalysis results with patient urine culture pending.  Will proceed with CBC and CMP refused xray.  Refused pyridium at this time.  History of group B strep with pregnancy also.  Voided 8 times per patient this am in clinic. patient refused imaging/xrays.  Discussed purpose with patient.  Agreed to blood draw/serum labs to check kidney function.  Discussed patient to call urology office to verify appt date and time as what she has on calendar different from what written in her chart. Patient requested I Call [phone] with urine culture results.  Patient verbalized understanding of information and had no further questions at this time  Imaging Review No results found.  1215 discussed CBC and CMP results, kdur 62meq x 1 po now.  Discussed potassium and calcium rich foods with patient.  Kidney function normal but chronic low potassium and calcium.  Instructed to follow up with  [facility] and urology for repeat labs within the next week.  ER if worsening symptoms for re-evaluation.  Start multivitamin.  May drink gatorade for electolyte replacement.  Patient verbalized understanding of information/instructions, agreed with plan of care.  1230 unable to administer 46meq kdur as only 69meq in clinic.  Order changed to 60meq po kdur now and Rx given to patient to repeat 82meq po kdur later today.  Handouts on potassium rich and calcium rich foods from up to date given to patient.  Discussed with patient diarrhea probably caused hypokalemia.  Follow up with [facility], [facility] as previous discussed for adnexal mass on CT, recurrent UTIs with strep alagatinae and chronic hypocalcemia/hypokalemia.  Patient verbalized understanding of information/instructions, agreed with plan of care and had no further questions at this time.  MDM   1. Dysuria   2. Hypokalemia   3. Hypocalcemia    Urine culture results typically available 48 hours will call with results once available.  Keep scheduled urology appt 26 Apr 2015 per patient.  Patient is also to push fluids and may use Pyridium 200mg  po TID as needed.  Hydrate, avoid dehydration.  Avoid holding urine void on frequent basis every 4 to 6 hours.  If unable to void every 8 hours follow up for re-evaluation with PCM, urgent care or ER.   Call or return to clinic as needed if these symptoms worsen or fail to improve as anticipated.  Exitcare handout on dysuria given to patient Patient verbalized agreement and understanding of treatment plan and had no further questions at this time. P2:  Hydrate and cranberry juice  Discussed with patient diarrhea, vomiting, kidney disease, medications,low dietary intake can cause hypokalemia.  Diarrhea this past week.  Potassium chloride 56meq po today.  Follow up with [facility] for repeat testing as chronic hypokalemia on review of [place] information.  Return to clinic or ER for re-evaluation if  muscle cramps, weakness, nausea, vomiting, diarrhea, fatigue, palpitations, syncope.  Exitcare handout on hypokalemia given to patient.  Up to date handout on potassium rich foods given to patient.  Patient verbalized understanding of information/instructions, agreed with plan of care and had no further questions at this time.  Discussed with patient kidney disease, medications, parathyroid disorder, low dietary intake can cause hypocalcemia.  Discussed start calcium with vitamin D supplement calcium carbonate.  Follow up with [facility] for repeat testing as chronic hypocalcemia on review of [place] information.  Return to clinic or ER for re-evaluation if muscle twitching, palpitations, dizzyness, swelling hands/feet, vision changes and/or, syncope.  Exitcare handout on hypocalcemia given to patient.  Up to date handout on calcium rich foods given to patient.  Patient verbalized understanding of information/instructions, agreed with plan of care and had no further questions at this  time.  Olen Cordial, NP 04/20/15 1432  23 Apr 2015 at 0902 attempted to notify patient via telephone urine culture results normal/negative but unable to leave voicemail and no answer telephone call message stated customer unavailable call again later.  Letter sent to address on file with lab results.  Keep appt with urology as previously scheduled for re-evaluation.  Olen Cordial, NP 04/23/15 [phone]

## 2015-04-19 NOTE — Discharge Instructions (Signed)
Calcium Intake Recommendations Calcium in our blood is important for the control of many things, such as:  Blood clotting.  Conducting of nerve impulses.  Muscle contraction.  Maintaining teeth and bone health.  Other body functions. Age group / Amount of calcium to consume daily, in milligrams (mg)  Birth to 6 months / 200 mg  Infants 7 to 12 months / 260 mg  Children 1 to 3 years / 700 mg  Children 4 to 8 years / 1,000 mg  Children 9 to 13 years / 1,300 mg  Teens 14 to 18 years / 1,300 mg  Adults 19 to 50 years / 1,000 mg  Adult women 51 to 70 years / 1,200 mg  Adults 71 years and older / 1,200 mg  Pregnant and breastfeeding teens / 1,300 mg  Pregnant and breastfeeding adults / 1,000 mg Document Released: 02/26/2004 Document Revised: 11/08/2012 Document Reviewed: 07/14/2005 [employer] Patient Information 2015 [place], [place]. This information is not intended to replace advice given to you by your health care provider. Make sure you discuss any questions you have with your health care provider.  Hypocalcemia, Adult Hypocalcemia is low blood calcium. Calcium is important for cells to function in the body. Low blood calcium can cause a variety of symptoms and problems. CAUSES   Low levels of a body protein called albumin.  Problems with the parathyroid glands or surgical removal of the parathyroid glands. The parathyroid glands maintain the body's level of calcium.  Decreased production or improper use of parathyroid hormone.  Lack (deficiency) of vitamin D or magnesium or both.  Intestinal problems that interfere with nutrient absorption.  Alcoholism.  Kidney problems.  Inflammation of the pancreas (pancreatitis).  Certain medicines.  Severe infections (sepsis).  Infiltrative diseases. With these diseases the parathyroid glands are filled with cells or substances that are not normally present. Examples  include:  Sarcoidosis.  Hemachromatosis.  Breakdown of large amounts of muscle fiber.  High levels of phosphate in the body.  Cancer.  Massive blood transfusions which usually occur with severe trauma. SYMPTOMS   Numbness and tingling in the fingers, toes, or around the mouth.  Muscle aches or cramps, especially in the legs, feet, and back.  Muscle twitches.  Shortness of breath or wheezing.  Difficulty swallowing.  Changes in the sound of the voice.  General weakness.  Fainting.  Fast heart beats (palpitations).  Chest pain.  Irritability.  Difficulty thinking.  Memory problems or confusion.  Severe fatigue.  Changes in personality.  Depression and anxiety.  Shaking uncontrollably (seizures).  Coarse, brittle hair and nails.  Dry skin or lasting (chronic) skin diseases (psoriasis, eczema, or dermatitis).  Clouding of the eye lens (cataracts).  Abdominal cramping or pain. DIAGNOSIS  Hypocalcemia is usually diagnosed through blood tests that reveal a low level of blood calcium. Other tests, such as a recording of the electrical activity of the heart (electrocardiogram, EKG), may be performed in order to diagnose the underlying cause of the condition. TREATMENT  Treatment for hypocalcemia includes giving calcium supplements. These can be given by mouth or by intravenous (IV) access tube, depending on the severity of the symptoms and deficiency. Other minerals (electrolytes), such as magnesium, may also be given. HOME CARE INSTRUCTIONS   Meet with a dietitian to make sure you are eating the most healthful diet possible, or follow diet instructions as directed by your caregiver.  Follow up with your caregiver as directed. SEEK IMMEDIATE MEDICAL CARE IF:   You develop chest pain.  You develop persistent rapid or irregular heartbeats.  You have difficulty breathing.  You faint.  You develop increased fatigue.  You have new swelling in the feet,  ankles, or legs.  You develop increased muscle twitching.  You start to have seizures.  You develop confusion.  You develop mood, memory, or personality changes. MAKE SURE YOU:   Understand these instructions.  Will watch your condition.  Will get help right away if you are not doing well or get worse. Document Released: 01/01/2010 Document Revised: 10/06/2011 Document Reviewed: 01/01/2010 [facility] Patient Information 2015 [place], [place]. This information is not intended to replace advice given to you by your health care provider. Make sure you discuss any questions you have with your health care provider.  Dysuria Dysuria is the medical term for pain with urination. There are many causes for dysuria, but urinary tract infection is the most common. If a urinalysis was performed it can show that there is a urinary tract infection. A urine culture confirms that you or your child is sick. You will need to follow up with a healthcare provider because:  If a urine culture was done you will need to know the culture results and treatment recommendations.  If the urine culture was positive, you or your child will need to be put on antibiotics or know if the antibiotics prescribed are the right antibiotics for your urinary tract infection.  If the urine culture is negative (no urinary tract infection), then other causes may need to be explored or antibiotics need to be stopped. Today laboratory work may have been done and there does not seem to be an infection. If cultures were done they will take at least 24 to 48 hours to be completed. Today x-rays may have been taken and they read as normal. No cause can be found for the problems. The x-rays may be re-read by a radiologist and you will be contacted if additional findings are made. You or your child may have been put on medications to help with this problem until you can see your primary caregiver. If the problems get better, see your primary  caregiver if the problems return. If you were given antibiotics (medications which kill germs), take all of the mediations as directed for the full course of treatment.  If laboratory work was done, you need to find the results. Leave a telephone number where you can be reached. If this is not possible, make sure you find out how you are to get test results. HOME CARE INSTRUCTIONS   Drink lots of fluids. For adults, drink eight, 8 ounce glasses of clear juice or water a day. For children, replace fluids as suggested by your caregiver.  Empty the bladder often. Avoid holding urine for long periods of time.  After a bowel movement, women should cleanse front to back, using each tissue only once.  Empty your bladder before and after sexual intercourse.  Take all the medicine given to you until it is gone. You may feel better in a few days, but TAKE ALL MEDICINE.  Avoid caffeine, tea, alcohol and carbonated beverages, because they tend to irritate the bladder.  In men, alcohol may irritate the prostate.  Only take over-the-counter or prescription medicines for pain, discomfort, or fever as directed by your caregiver.  If your caregiver has given you a follow-up appointment, it is very important to keep that appointment. Not keeping the appointment could result in a chronic or permanent injury, pain, and disability. If there  is any problem keeping the appointment, you must call back to this facility for assistance. SEEK IMMEDIATE MEDICAL CARE IF:   Back pain develops.  A fever develops.  There is nausea (feeling sick to your stomach) or vomiting (throwing up).  Problems are no better with medications or are getting worse. MAKE SURE YOU:   Understand these instructions.  Will watch your condition.  Will get help right away if you are not doing well or get worse. Document Released: 04/11/2004 Document Revised: 10/06/2011 Document Reviewed: 02/17/2008 [facility] Patient Information 2015  [place], [place]. This information is not intended to replace advice given to you by your health care provider. Make sure you discuss any questions you have with your health care provider.  Hypokalemia Hypokalemia means that the amount of potassium in the blood is lower than normal.Potassium is a chemical, called an electrolyte, that helps regulate the amount of fluid in the body. It also stimulates muscle contraction and helps nerves function properly.Most of the body's potassium is inside of cells, and only a very small amount is in the blood. Because the amount in the blood is so small, minor changes can be life-threatening. CAUSES  Antibiotics.  Diarrhea or vomiting.  Using laxatives too much, which can cause diarrhea.  Chronic kidney disease.  Water pills (diuretics).  Eating disorders (bulimia).  Low magnesium level.  Sweating a lot. SIGNS AND SYMPTOMS  Weakness.  Constipation.  Fatigue.  Muscle cramps.  Mental confusion.  Skipped heartbeats or irregular heartbeat (palpitations).  Tingling or numbness. DIAGNOSIS  Your health care provider can diagnose hypokalemia with blood tests. In addition to checking your potassium level, your health care provider may also check other lab tests. TREATMENT Hypokalemia can be treated with potassium supplements taken by mouth or adjustments in your current medicines. If your potassium level is very low, you may need to get potassium through a vein (IV) and be monitored in the hospital. A diet high in potassium is also helpful. Foods high in potassium are:  Nuts, such as peanuts and pistachios.  Seeds, such as sunflower seeds and pumpkin seeds.  Peas, lentils, and lima beans.  Whole grain and bran cereals and breads.  Fresh fruit and vegetables, such as apricots, avocado, bananas, cantaloupe, kiwi, oranges, tomatoes, asparagus, and potatoes.  Orange and tomato juices.  Red meats.  Fruit yogurt. HOME CARE  INSTRUCTIONS  Take all medicines as prescribed by your health care provider.  Maintain a healthy diet by including nutritious food, such as fruits, vegetables, nuts, whole grains, and lean meats.  If you are taking a laxative, be sure to follow the directions on the label. SEEK MEDICAL CARE IF:  Your weakness gets worse.  You feel your heart pounding or racing.  You are vomiting or having diarrhea.  You are diabetic and having trouble keeping your blood glucose in the normal range. SEEK IMMEDIATE MEDICAL CARE IF:  You have chest pain, shortness of breath, or dizziness.  You are vomiting or having diarrhea for more than 2 days.  You faint. MAKE SURE YOU:   Understand these instructions.  Will watch your condition.  Will get help right away if you are not doing well or get worse. Document Released: 07/14/2005 Document Revised: 05/04/2013 Document Reviewed: 01/14/2013 [facility] Patient Information 2015 [place], [place]. This information is not intended to replace advice given to you by your health care provider. Make sure you discuss any questions you have with your health care provider.

## 2015-04-20 MED ORDER — POTASSIUM CHLORIDE ER 10 MEQ PO TBCR: 10.0000 meq | EXTENDED_RELEASE_TABLET | Freq: Once | ORAL | Status: DC

## 2015-04-21 LAB — URINE CULTURE: Culture: NO GROWTH

## 2015-08-08 ENCOUNTER — Ambulatory Visit
Admission: EM | Admit: 2015-08-08 | Discharge: 2015-08-08 | Disposition: A | Discharge status: Home / Self Care | Payer: Medicaid Other | Attending: Family Medicine | Admitting: Family Medicine

## 2015-08-08 ENCOUNTER — Encounter: Payer: Self-pay | Admitting: Emergency Medicine

## 2015-08-08 DIAGNOSIS — Z87440 Personal history of urinary (tract) infections: Secondary | ICD-10-CM | POA: Diagnosis not present

## 2015-08-08 DIAGNOSIS — R300 Dysuria: Secondary | ICD-10-CM | POA: Diagnosis not present

## 2015-08-08 DIAGNOSIS — I341 Nonrheumatic mitral (valve) prolapse: Secondary | ICD-10-CM | POA: Insufficient documentation

## 2015-08-08 DIAGNOSIS — F909 Attention-deficit hyperactivity disorder, unspecified type: Secondary | ICD-10-CM | POA: Diagnosis not present

## 2015-08-08 LAB — URINALYSIS COMPLETE WITH MICROSCOPIC (ARMC ONLY)
BACTERIA UA: NONE SEEN
BILIRUBIN URINE: NEGATIVE
Glucose, UA: NEGATIVE mg/dL
Ketones, ur: NEGATIVE mg/dL
LEUKOCYTES UA: NEGATIVE
NITRITE: NEGATIVE
PH: 6.5 (ref 5.0–8.0)
Protein, ur: NEGATIVE mg/dL
Specific Gravity, Urine: <1.005 — ABNORMAL LOW (ref 1.005–1.030)
WBC, UA: NONE SEEN WBC/hpf (ref 0–5)

## 2015-08-08 NOTE — ED Provider Notes (Signed)
CSN: [id number]     Arrival date & time 08/08/15  1610 History   First MD Initiated Contact with Patient 08/08/15 1657     Chief Complaint  Patient presents with  . Dysuria   (Consider location/radiation/quality/duration/timing/severity/associated sxs/prior Treatment) Patient is a 45 y.o. female presenting with dysuria. The history is provided by the patient.  Dysuria Pain quality:  Burning Pain severity:  Mild Onset quality:  Sudden Duration:  4 days Timing:  Constant Progression:  Unchanged Chronicity:  Recurrent Recent urinary tract infections: yes   Ineffective treatments: recently prescribed clindamycin (patient allergic to multiple antibiotics) but states clindamycin upsetting her stomach. Urinary symptoms: frequent urination   Urinary symptoms: no hematuria and no hesitancy   Associated symptoms: fever   Associated symptoms: no abdominal pain, no genital lesions, no nausea, no vaginal discharge and no vomiting   Risk factors: sexually active     Past Medical History  Diagnosis Date  . ADHD (attention deficit hyperactivity disorder)   . Mitral valve prolapse   . UTI (urinary tract infection)   . Spina bifida occulta   . Anxiety    Past Surgical History  Procedure Laterality Date  . Leep    . Leep     Family History  Problem Relation Age of Onset  . Cancer Mother   . Cancer Father    Social History  Substance Use Topics  . Smoking status: Never Smoker   . Smokeless tobacco: None  . 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  Gastrointestinal: Negative for nausea, vomiting and abdominal pain.  Genitourinary: Positive for dysuria. Negative for vaginal discharge.    Allergies  Bactrim; Gadolinium  derivatives; Iodinated diagnostic agents; Other; Sulfa antibiotics; Azithromycin; Clarithromycin; Furosemide; Guaifenesin; Macrobid; Nitrofurantoin; Penicillins; Amoxicillin; Doxycycline; Keflex; and Levofloxacin  Home Medications   Prior to Admission medications   Medication Sig Start Date End Date Taking? Authorizing Provider  ALPRAZolam Duanne Moron) 1 MG tablet Take 1 mg by mouth 3 (three) times daily as needed for anxiety.    Historical Provider, MD  fluticasone (FLONASE) 50 MCG/ACT nasal spray Place 2 sprays into both nostrils daily as needed. 01/26/15   Historical Provider, MD  Norgestimate-Ethinyl Estradiol Triphasic (TRI-SPRINTEC) 0.18/0.215/0.25 MG-35 MCG tablet Take 1 tablet by mouth daily.    Historical Provider, MD  phenazopyridine (PYRIDIUM) 200 MG tablet Take 1 tablet (200 mg total) by mouth 3 (three) times daily. 04/19/15   Olen Cordial, NP  potassium chloride (K-DUR) 10 MEQ tablet Take 1 tablet (10 mEq total) by mouth once. 04/20/15   Olen Cordial, NP  potassium chloride 20 MEQ TBCR Take 20 mEq by mouth once. 04/19/15   Olen Cordial, NP   Meds Ordered and Administered this Visit  Medications - No data to display  BP 150/79 mmHg  Pulse 83  Temp(Src) 98.8 F (37.1 C) (Tympanic)  Resp 16  Ht 5\' 6"  (1.676 m)  Wt 175 lb (79.379 kg)  BMI 28.26 kg/m2  SpO2 99%  LMP 07/18/2015 (Approximate) No data found.   Physical Exam  Constitutional: She appears well-developed and well-nourished. No distress.  Abdominal: Soft. Bowel sounds are normal.  She exhibits no distension and no mass. There is tenderness (mild suprapubic). There is no rebound and no guarding.  Skin: No rash noted. She is not diaphoretic.  Nursing note and vitals reviewed.   ED Course  Procedures (including critical care time)  Labs Review Labs Reviewed  URINALYSIS COMPLETEWITH MICROSCOPIC ([facility] ONLY) - Abnormal; Notable for the following:    Color, Urine STRAW (*)    Specific Gravity, Urine <1.005  (*)    Hgb urine dipstick 1+ (*)    Squamous Epithelial / LPF 0-5 (*)    All other components within normal limits  URINE CULTURE  CHLAMYDIA/NGC RT PCR ([facility] ONLY)    Imaging Review No results found.   Visual Acuity Review  Right Eye Distance:   Left Eye Distance:   Bilateral Distance:    Right Eye Near:   Left Eye Near:    Bilateral Near:         MDM   1. Dysuria    1. Lab results and diagnosis reviewed with patient 2. Continue and finish current clindamycin prescribed by PCP 3. Recommend supportive treatment with increased water  4. Recommend, again, that patient follow up with urology; has had multiple urgent care visits with recurrent symptoms and has been advised multiple times by follow up with urologist, however patient has not done so.    Norval Gable, MD 08/08/15 [phone]

## 2015-08-08 NOTE — ED Notes (Signed)
Patient c/o burning when urinating since Sunday.  Patient states that she started Clindamycin on Sunday.  Patient reports low grade fevers.

## 2015-08-11 ENCOUNTER — Telehealth: Payer: Self-pay

## 2015-08-11 LAB — URINE CULTURE: SPECIAL REQUESTS: NORMAL

## 2015-08-11 NOTE — ED Notes (Signed)
Attempted to call patient to inform her that urine culture was negative and to follow up with assigned Urologist. No answer. No answering machine

## 2015-08-11 NOTE — ED Notes (Signed)
Patient returned call to [facility] and results given re: negative urine culture. Questioned if GC/Chlamydia was done. Explained no. Instructed to follow up with PCP.

## 2016-04-25 ENCOUNTER — Encounter: Payer: Self-pay | Admitting: Emergency Medicine

## 2016-04-25 ENCOUNTER — Ambulatory Visit
Admission: EM | Admit: 2016-04-25 | Discharge: 2016-04-25 | Disposition: A | Discharge status: Home / Self Care | Payer: Medicaid Other | Attending: Family Medicine | Admitting: Family Medicine

## 2016-04-25 DIAGNOSIS — Z79899 Other long term (current) drug therapy: Secondary | ICD-10-CM | POA: Insufficient documentation

## 2016-04-25 DIAGNOSIS — N390 Urinary tract infection, site not specified: Secondary | ICD-10-CM | POA: Insufficient documentation

## 2016-04-25 DIAGNOSIS — R300 Dysuria: Secondary | ICD-10-CM | POA: Diagnosis present

## 2016-04-25 DIAGNOSIS — Z880 Allergy status to penicillin: Secondary | ICD-10-CM | POA: Diagnosis not present

## 2016-04-25 LAB — URINALYSIS COMPLETE WITH MICROSCOPIC (ARMC ONLY)
BILIRUBIN URINE: NEGATIVE
GLUCOSE, UA: NEGATIVE mg/dL
KETONES UR: NEGATIVE mg/dL
LEUKOCYTES UA: NEGATIVE
NITRITE: NEGATIVE
Protein, ur: NEGATIVE mg/dL
Specific Gravity, Urine: 1.01 (ref 1.005–1.030)
pH: 6 (ref 5.0–8.0)

## 2016-04-25 MED ORDER — CLINDAMYCIN HCL 150 MG PO CAPS: 150.0000 mg | ORAL_CAPSULE | Freq: Three times a day (TID) | ORAL | 0 refills | Status: DC

## 2016-04-25 NOTE — ED Provider Notes (Signed)
[facility]    CSN: [id number] Arrival date & time: 04/25/16  0843     History   Chief Complaint Chief Complaint  Patient presents with  . Dysuria    HPI Shirley [name] is a 45 y.o. female.   The history is provided by the patient.  Dysuria  Pain quality:  Burning and stabbing Pain severity:  Mild Onset quality:  Sudden Duration:  3 days Timing:  Constant Chronicity:  New Recent urinary tract infections: no   Relieved by:  None tried Urinary symptoms: frequent urination and hematuria   Urinary symptoms: no discolored urine, no foul-smelling urine, no hesitancy and no bladder incontinence   Associated symptoms: no abdominal pain, no fever, no flank pain, no genital lesions, no nausea, no vaginal discharge and no vomiting   Risk factors: no hx of pyelonephritis, no hx of urolithiasis, no kidney transplant, not pregnant, no recurrent urinary tract infections, no renal cysts, no renal disease, not sexually active, no sexually transmitted infections, no single kidney and no urinary catheter     Past Medical History:  Diagnosis Date  . ADHD (attention deficit hyperactivity disorder)   . Anxiety   . Mitral valve prolapse   . Spina bifida occulta   . UTI (urinary tract infection)     There are no active problems to display for this patient.   Past Surgical History:  Procedure Laterality Date  . LEEP    . 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 Authorizing Provider  ALPRAZolam Duanne Moron) 1 MG tablet Take 1 mg by mouth 3 (three) times daily as needed for anxiety.    Historical Provider, MD  clindamycin (CLEOCIN) 150 MG capsule Take 1  capsule (150 mg total) by mouth 3 (three) times daily. 04/25/16   Norval Gable, MD  fluticasone (FLONASE) 50 MCG/ACT nasal spray Place 2 sprays into both nostrils daily as needed. 01/26/15   Historical Provider, MD  Norgestimate-Ethinyl Estradiol Triphasic (TRI-SPRINTEC) 0.18/0.215/0.25 MG-35 MCG tablet Take 1 tablet by mouth daily.    Historical Provider, MD    Family History Family History  Problem Relation Age of Onset  . Cancer Mother   . Cancer Father     Social History Social History  Substance Use Topics  . Smoking status: Never Smoker  . Smokeless tobacco: Never Used  . Alcohol use No     Allergies   Bactrim [sulfamethoxazole-trimethoprim]; Gadolinium derivatives; Iodinated diagnostic agents; Other; Sulfa antibiotics; Azithromycin; Clarithromycin; Furosemide; Guaifenesin; Macrobid [nitrofurantoin monohyd macro]; Nitrofurantoin; Penicillins; Amoxicillin; Doxycycline; Keflex [cephalexin]; and Levofloxacin   Review of Systems Review of Systems  Constitutional: Negative for fever.  Gastrointestinal: Negative for abdominal pain, nausea and vomiting.  Genitourinary: Positive for dysuria. Negative for flank pain and vaginal discharge.     Physical Exam Triage Vital Signs ED Triage Vitals  Enc Vitals Group     BP 04/25/16 0918 140/76     Pulse Rate 04/25/16 0918 85     Resp 04/25/16 0918  16     Temp 04/25/16 0918 97.9 F (36.6 C)     Temp Source 04/25/16 0918 Oral     SpO2 04/25/16 0918 100 %     Weight 04/25/16 0918 180 lb (81.6 kg)     Height 04/25/16 0918 5\' 6"  (1.676 m)     Head Circumference --      Peak Flow --      Pain Score 04/25/16 0922 8     Pain Loc --      Pain Edu? --      Excl. in [place]? --    No data found.   Updated Vital Signs BP 140/76 (BP Location: Left Arm)   Pulse 85   Temp 97.9 F (36.6 C) (Oral)   Resp 16   Ht 5\' 6"  (1.676 m)   Wt 180 lb (81.6 kg)   LMP 03/28/2016 (Approximate)   SpO2 100%   BMI 29.05 kg/m   Visual Acuity Right  Eye Distance:   Left Eye Distance:   Bilateral Distance:    Right Eye Near:   Left Eye Near:    Bilateral Near:     Physical Exam  Constitutional: She appears well-developed and well-nourished. No distress.  Abdominal: Soft. Bowel sounds are normal. She exhibits no distension and no mass. There is tenderness (mild, suprapubic). There is no rebound and no guarding.  Skin: She is not diaphoretic.  Nursing note and vitals reviewed.    UC Treatments / Results  Labs (all labs ordered are listed, but only abnormal results are displayed) Labs Reviewed  URINALYSIS COMPLETEWITH MICROSCOPIC ([place]) - Abnormal; Notable for the following:       Result Value   Hgb urine dipstick MODERATE (*)    Bacteria, UA FEW (*)    Squamous Epithelial / LPF 6-30 (*)    All other components within normal limits  URINE CULTURE    EKG  EKG Interpretation None       Radiology No results found.  Procedures Procedures (including critical care time)  Medications Ordered in UC Medications - No data to display   Initial Impression / Assessment and Plan / UC Course  I have reviewed the triage vital signs and the nursing notes.  Pertinent labs & imaging results that were available during my care of the patient were reviewed by me and considered in my medical decision making (see chart for details).  Clinical Course      Final Clinical Impressions(s) / UC Diagnoses   Final diagnoses:  UTI (lower urinary tract infection)    New Prescriptions Discharge Medication List as of 04/25/2016 10:07 AM    START taking these medications   Details  clindamycin (CLEOCIN) 150 MG capsule Take 1 capsule (150 mg total) by mouth 3 (three) times daily., Starting Fri 04/25/2016, Normal        1. Labs and diagnosis reviewed with patient 2. rx as per orders above; reviewed possible side effects, interactions, risks and benefits  3. Recommend supportive treatment with increased water intake 4.  Follow-up prn if symptoms worsen or don't improve   Norval Gable, MD 04/25/16 1139

## 2016-04-25 NOTE — ED Triage Notes (Signed)
Patient c/o pressure in her bladder, increase in urinary urgency, and burning when urinating for the past 3 days.

## 2016-04-26 LAB — URINE CULTURE

## 2016-07-31 ENCOUNTER — Encounter: Payer: Self-pay | Admitting: Obstetrics and Gynecology

## 2016-08-27 ENCOUNTER — Encounter: Payer: Self-pay | Admitting: Obstetrics and Gynecology

## 2016-09-05 ENCOUNTER — Encounter: Payer: Self-pay | Admitting: Emergency Medicine

## 2016-09-05 ENCOUNTER — Emergency Department
Admission: EM | Admit: 2016-09-05 | Discharge: 2016-09-05 | Disposition: A | Discharge status: Home / Self Care | Payer: Medicaid Other | Attending: Emergency Medicine | Admitting: Emergency Medicine

## 2016-09-05 ENCOUNTER — Emergency Department: Payer: Medicaid Other

## 2016-09-05 DIAGNOSIS — R1032 Left lower quadrant pain: Secondary | ICD-10-CM

## 2016-09-05 DIAGNOSIS — R1030 Lower abdominal pain, unspecified: Secondary | ICD-10-CM | POA: Diagnosis present

## 2016-09-05 DIAGNOSIS — N83201 Unspecified ovarian cyst, right side: Secondary | ICD-10-CM

## 2016-09-05 DIAGNOSIS — F909 Attention-deficit hyperactivity disorder, unspecified type: Secondary | ICD-10-CM | POA: Insufficient documentation

## 2016-09-05 LAB — URINALYSIS, COMPLETE (UACMP) WITH MICROSCOPIC
BILIRUBIN URINE: NEGATIVE
Glucose, UA: NEGATIVE mg/dL
Hgb urine dipstick: NEGATIVE
Ketones, ur: NEGATIVE mg/dL
LEUKOCYTES UA: NEGATIVE
NITRITE: NEGATIVE
Protein, ur: NEGATIVE mg/dL
SPECIFIC GRAVITY, URINE: 1.002 — AB (ref 1.005–1.030)
WBC UA: NONE SEEN WBC/hpf (ref 0–5)
pH: 6 (ref 5.0–8.0)

## 2016-09-05 LAB — COMPREHENSIVE METABOLIC PANEL
ALBUMIN: 4.4 g/dL (ref 3.5–5.0)
ALT: 35 U/L (ref 14–54)
ANION GAP: 8 (ref 5–15)
AST: 32 U/L (ref 15–41)
Alkaline Phosphatase: 90 U/L (ref 38–126)
BILIRUBIN TOTAL: 0.5 mg/dL (ref 0.3–1.2)
BUN: 13 mg/dL (ref 6–20)
CHLORIDE: 106 mmol/L (ref 101–111)
CO2: 24 mmol/L (ref 22–32)
Calcium: 9.4 mg/dL (ref 8.9–10.3)
Creatinine, Ser: 0.79 mg/dL (ref 0.44–1.00)
GFR calc Af Amer: >60 mL/min (ref >60)
GFR calc non Af Amer: >60 mL/min (ref >60)
GLUCOSE: 104 mg/dL — AB (ref 65–99)
POTASSIUM: 3.7 mmol/L (ref 3.5–5.1)
SODIUM: 138 mmol/L (ref 135–145)
TOTAL PROTEIN: 7.5 g/dL (ref 6.5–8.1)

## 2016-09-05 LAB — CBC
HEMATOCRIT: 35.8 % (ref 35.0–47.0)
HEMOGLOBIN: 11.8 g/dL — AB (ref 12.0–16.0)
MCH: 28.3 pg (ref 26.0–34.0)
MCHC: 33 g/dL (ref 32.0–36.0)
MCV: 85.8 fL (ref 80.0–100.0)
Platelets: 267 10*3/uL (ref 150–440)
RBC: 4.17 MIL/uL (ref 3.80–5.20)
RDW: 13.6 % (ref 11.5–14.5)
WBC: 7.2 10*3/uL (ref 3.6–11.0)

## 2016-09-05 LAB — POCT PREGNANCY, URINE: PREG TEST UR: NEGATIVE

## 2016-09-05 LAB — LIPASE, BLOOD: LIPASE: 22 U/L (ref 11–51)

## 2016-09-05 MED ORDER — IBUPROFEN 400 MG PO TABS: 400.0000 mg | ORAL_TABLET | Freq: Four times a day (QID) | ORAL | 0 refills | Status: DC | PRN

## 2016-09-05 NOTE — ED Provider Notes (Signed)
Time Seen: Approximately 1437  I have reviewed the triage notes  Chief Complaint: Abdominal Pain   History of Present Illness: Shirley [name] is a 46 y.o. female **who presents with complaints of some lower abdominal pain swelling and feels periodically bloated over the lower abdominal region. She states occasionally pain suprapubic whenever she sits upright. He is been off her birth control now for the last couple months because she "" one to lose some weight "". She was concerned that she has not had a menstrual period has a known history of an ovarian cyst. She denies any fever at home or abnormal vaginal discharge or bleeding.   Past Medical History:  Diagnosis Date  . ADHD (attention deficit hyperactivity disorder)   . Anxiety   . Mitral valve prolapse   . Spina bifida occulta   . UTI (urinary tract infection)     There are no active problems to display for this patient.   Past Surgical History:  Procedure Laterality Date  . LEEP    . LEEP      Past Surgical History:  Procedure Laterality Date  . LEEP    . LEEP      Current Outpatient Rx  . Order #: [id number] Class: Historical Med  . Order #: [id number] Class: Normal  . Order #: [id number] Class: Historical Med  . Order #: [id number] Class: Print  . Order #: [id number] Class: Historical Med    Allergies:  Bactrim [sulfamethoxazole-trimethoprim]; Gadolinium derivatives; Iodinated diagnostic agents; Other; Sulfa antibiotics; Azithromycin; Clarithromycin; Furosemide; Guaifenesin; Macrobid [nitrofurantoin monohyd macro]; Nitrofurantoin; Penicillins; Amoxicillin; Doxycycline; Keflex [cephalexin]; and Levofloxacin  Family History: Family History  Problem Relation Age of Onset  . Cancer Mother   . Cancer Father     Social History: Social History  Substance Use Topics  . Smoking status: Never Smoker  . Smokeless tobacco: Never Used  . Alcohol use No     Review of Systems:   10 point review of systems was  performed and was otherwise negative:  Constitutional: No fever Eyes: No visual disturbances ENT: No sore throat, ear pain Cardiac: No chest pain Respiratory: No shortness of breath, wheezing, or stridor Abdomen: No abdominal pain, no vomiting, No diarrhea Endocrine: No weight loss, No night sweats Extremities: No peripheral edema, cyanosis Skin: No rashes, easy bruising Neurologic: No focal weakness, trouble with speech or swollowing Urologic: No dysuria, Hematuria, or urinary frequency   Physical Exam:  ED Triage Vitals  Enc Vitals Group     BP 09/05/16 1030 133/68     Pulse Rate 09/05/16 1030 82     Resp 09/05/16 1030 14     Temp 09/05/16 1030 97.5 F (36.4 C)     Temp Source 09/05/16 1030 Oral     SpO2 09/05/16 1030 100 %     Weight 09/05/16 1030 187 lb (84.8 kg)     Height 09/05/16 1030 5\' 6"  (1.676 m)     Head Circumference --      Peak Flow --      Pain Score 09/05/16 1031 9     Pain Loc --      Pain Edu? --      Excl. in [place]? --     General: Awake , Alert , and Oriented times 3; GCS 15 Head: Normal cephalic , atraumatic Eyes: Pupils equal , round, reactive to light Nose/Throat: No nasal drainage, patent upper airway without erythema or exudate.  Neck: Supple, Full range of motion, No anterior adenopathy or palpable thyroid masses  Lungs: Clear to ascultation without wheezes , rhonchi, or rales Heart: Regular rate, regular rhythm without murmurs , gallops , or rubs Abdomen: Soft, non tender without rebound, guarding , or rigidity; bowel sounds positive and symmetric in all 4 quadrants. No organomegaly .  No focal tenderness over McBurney's point, negative Murphy's sign      Extremities: 2 plus symmetric pulses. No edema, clubbing or cyanosis Neurologic: normal ambulation, Motor symmetric without deficits, sensory intact Skin: warm, dry, no rashes   Labs:   All laboratory work was reviewed including any pertinent negatives or positives listed below:  Labs  Reviewed  COMPREHENSIVE METABOLIC PANEL - Abnormal; Notable for the following:       Result Value   Glucose, Bld 104 (*)    All other components within normal limits  CBC - Abnormal; Notable for the following:    Hemoglobin 11.8 (*)    All other components within normal limits  URINALYSIS, COMPLETE (UACMP) WITH MICROSCOPIC - Abnormal; Notable for the following:    Color, Urine COLORLESS (*)    APPearance CLEAR (*)    Specific Gravity, Urine 1.002 (*)    Bacteria, UA RARE (*)    Squamous Epithelial / LPF 0-5 (*)    All other components within normal limits  LIPASE, BLOOD  POC URINE PREG, ED  POCT PREGNANCY, URINE    Radiology: * "Ct Renal Stone Study  Result Date: 09/05/2016 CLINICAL DATA:  Abdominal swelling, lower abdominal pain for about a month, LEFT lower quadrant pain EXAM: CT ABDOMEN AND PELVIS WITHOUT CONTRAST TECHNIQUE: Multidetector CT imaging of the abdomen and pelvis was performed following the standard protocol without IV contrast. Sagittal and coronal MPR images reconstructed from axial data set. Oral contrast was not administered. COMPARISON:  02/16/2015 FINDINGS: Lower chest: Lung bases clear Hepatobiliary: Gallbladder and liver normal appearance Pancreas: Normal appearance Spleen: Normal appearance Adrenals/Urinary Tract: Adrenal glands, kidneys, ureters, and bladder normal appearance Stomach/Bowel: Normal appendix. Stomach and bowel loops normal appearance Vascular/Lymphatic: Unremarkable Reproductive: Normal appearing uterus and LEFT adnexa. RIGHT ovarian cyst 4.2 x 2.9 cm image 76 Other: No free air or free fluid. Tiny umbilical hernia containing fat. No inflammatory process or additional hernia. Musculoskeletal: Osseous structures unremarkable IMPRESSION: RIGHT ovarian cyst 4.2 x 2.9 cm increased from the 3.2 x 2.4 cm on 02/16/2015. Tiny umbilical hernia containing fat. Otherwise negative exam. Electronically Signed   By: Lavonia Dana M.D.   On: 09/05/2016 14:45  "  I  personally reviewed the radiologic studies     ED Course: * Patient's stay here was uneventful is otherwise hemodynamically stable. She does not appear to have signs or symptoms consistent with a surgical abdomen and her CAT scan shows slightly enlarging ovarian cyst but no other significant pathology. She does have an upcoming appointment with her OB/GYN and has a lot of concerns around the fact that she has not had a menstrual period after being off the birth control and also questions about whether or not to restart the birth control or not. I advised her to leave that up to her discretion but I didn't feel he would cause any harm for her to go back on the birth control tablets and return here if she develops a fever or significant vaginal bleeding such as more than 1 full pad per hour for 4 consecutive hours. She also can acquire a pelvic exam and Pap smear etc. at the OB/GYN's office and I did not feel there was a reason to do it  here in emergency department. She was given a copy of her CAT scan to take with her and follow-up with the OB/GYN     Final Clinical Impression:   Final diagnoses:  Left lower quadrant pain  Cyst of right ovary     Plan: * Outpatient " Discharge Medication List as of 09/05/2016  3:13 PM    START taking these medications   Details  ibuprofen (ADVIL,MOTRIN) 400 MG tablet Take 1 tablet (400 mg total) by mouth every 6 (six) hours as needed., Starting Fri 09/05/2016, Print      " Patient was advised to return immediately if condition worsens. Patient was advised to follow up with their primary care physician or other specialized physicians involved in their outpatient care. The patient and/or family member/power of attorney had laboratory results reviewed at the bedside. All questions and concerns were addressed and appropriate discharge instructions were distributed by the nursing staff.             Daymon Larsen, MD 09/05/16 [phone]

## 2016-09-05 NOTE — Discharge Instructions (Signed)
Please return immediately if condition worsens. Please contact her primary physician or the physician you were given for referral. If you have any specialist physicians involved in her treatment and plan please also contact them. Thank you for using [facility]. ° °

## 2016-09-05 NOTE — ED Notes (Signed)
Says she is getting worse and stomach is more swollen and painful.  Does not want pain med.

## 2016-09-05 NOTE — ED Triage Notes (Signed)
Says no bcp since nov and has not gotten period yet.  Says abd swollen and n ow with pain low abd.  Pain started aobut 1 month ago.

## 2016-09-18 ENCOUNTER — Encounter: Payer: Self-pay | Admitting: Obstetrics and Gynecology

## 2016-09-19 ENCOUNTER — Encounter: Payer: Self-pay | Admitting: Obstetrics and Gynecology

## 2016-09-19 ENCOUNTER — Ambulatory Visit (INDEPENDENT_AMBULATORY_CARE_PROVIDER_SITE_OTHER): Payer: Medicaid Other | Admitting: Obstetrics and Gynecology

## 2016-09-19 VITALS — BP 138/69 | HR 76 | Ht 66.0 in | Wt 188.6 lb

## 2016-09-19 DIAGNOSIS — R350 Frequency of micturition: Secondary | ICD-10-CM | POA: Insufficient documentation

## 2016-09-19 DIAGNOSIS — Z8041 Family history of malignant neoplasm of ovary: Secondary | ICD-10-CM

## 2016-09-19 DIAGNOSIS — Z1231 Encounter for screening mammogram for malignant neoplasm of breast: Secondary | ICD-10-CM

## 2016-09-19 DIAGNOSIS — R638 Other symptoms and signs concerning food and fluid intake: Secondary | ICD-10-CM | POA: Insufficient documentation

## 2016-09-19 DIAGNOSIS — N912 Amenorrhea, unspecified: Secondary | ICD-10-CM | POA: Diagnosis not present

## 2016-09-19 DIAGNOSIS — N951 Menopausal and female climacteric states: Secondary | ICD-10-CM

## 2016-09-19 DIAGNOSIS — N83201 Unspecified ovarian cyst, right side: Secondary | ICD-10-CM | POA: Diagnosis not present

## 2016-09-19 DIAGNOSIS — R635 Abnormal weight gain: Secondary | ICD-10-CM

## 2016-09-19 DIAGNOSIS — F419 Anxiety disorder, unspecified: Secondary | ICD-10-CM | POA: Diagnosis not present

## 2016-09-19 DIAGNOSIS — Z9889 Other specified postprocedural states: Secondary | ICD-10-CM

## 2016-09-19 DIAGNOSIS — Z800 Family history of malignant neoplasm of digestive organs: Secondary | ICD-10-CM | POA: Diagnosis not present

## 2016-09-19 DIAGNOSIS — N939 Abnormal uterine and vaginal bleeding, unspecified: Secondary | ICD-10-CM | POA: Diagnosis not present

## 2016-09-19 DIAGNOSIS — Z1239 Encounter for other screening for malignant neoplasm of breast: Secondary | ICD-10-CM

## 2016-09-19 LAB — POCT URINALYSIS DIPSTICK
Bilirubin, UA: NEGATIVE
Glucose, UA: NEGATIVE
Ketones, UA: NEGATIVE
LEUKOCYTES UA: NEGATIVE
Nitrite, UA: NEGATIVE
PH UA: 6
PROTEIN UA: NEGATIVE
UROBILINOGEN UA: NEGATIVE

## 2016-09-19 LAB — POCT URINE PREGNANCY: PREG TEST UR: NEGATIVE

## 2016-09-19 NOTE — Patient Instructions (Signed)
1. Ultrasound is scheduled to assess ovarian cyst and abnormal uterine bleeding 2. Due to your strong family history of cancers, including ovarian cancer in mom, I recommend that you be screened for genetic risk for ovarian cancer, colon cancer, breast cancer. This blood work can be drawn at your next office visit. 3. Annual screening labs will be obtained at your next visit 4. Mammogram will be ordered 5. Return in 1 week after ultrasound for physical exam 6. Birth control pills may be restarted on this Sunday

## 2016-09-19 NOTE — Progress Notes (Signed)
GYN ENCOUNTER NOTE  Subjective:       Shirley [name] is a 46 y.o. G29P2002 female is here for gynecologic evaluation of the following issues:  1. Amenorrhea 2. Weight gain 3. Family history of ovarian cancer  46 year old divorced female 2012, using nothing for contraception, recently stopping OCPs in November 2017, with onset of amenorrhea after stopping pills until 2 days ago (when she started her first cycle after stopping pills), presents for workup.  Patient has noted recent weight gain with weight occurring in her mid abdomen and buttocks. She reports hair loss, fatigue. She has not been screened for thyroid disease recently although her primary care provider had said that she possibly has several thyroid cysts.  Significant past gynecologic history is notable for having HPV and cervical dysplasia: She is status post LEEP cone biopsy shortly after her last pregnancy. Patient is in need of mammogram.  Significant family history is notable for mom being deceased from ovarian cancer and pancreatic cancer at age 35. The family has been screened for BRCA1/BRCA2 or other genetic syndromes. Maternal grandfather had colon cancer. Father has prostate cancer..       Gynecologic History Patient's last menstrual period was 09/18/2016 (exact date). Contraception: none Last Pap: Unknown Last mammogram: Unknown Menarche: Age 26 Intervals: Monthly Duration: 5-7 days, heavy Dysmenorrhea: Severe; central cramping and low back pain without radiation was comments: Patient did miss days of school because of pain; Advil and Tylenol are used for cramps. 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 Abnormal Pap smear of cervix   . ADHD (attention deficit hyperactivity disorder)   . Anxiety   . Mitral valve prolapse   . Spina bifida occulta   . UTI (urinary tract infection)     Past Surgical History:  Procedure Laterality Date  . LEEP    . LEEP      Current Outpatient Prescriptions on File Prior to Visit  Medication Sig Dispense Refill  . ALPRAZolam (XANAX) 1 MG tablet Take 1 mg by mouth 3 (three) times daily as needed for anxiety.    . clindamycin (CLEOCIN) 150 MG capsule Take 1 capsule (150 mg total) by mouth 3 (three) times daily. 21 capsule 0  . fluticasone (FLONASE) 50 MCG/ACT nasal spray Place 2 sprays into both nostrils daily as needed.  5  . ibuprofen (ADVIL,MOTRIN) 400 MG tablet Take 1 tablet (400 mg total) by mouth every 6 (six) hours as needed. 30 tablet 0  . Norgestimate-Ethinyl Estradiol Triphasic (TRI-SPRINTEC) 0.18/0.215/0.25 MG-35 MCG tablet Take 1 tablet by mouth daily.    . [DISCONTINUED] amphetamine-dextroamphetamine (ADDERALL) 10 MG tablet Take 10 mg by mouth 2 (two) times daily.     No current facility-administered medications on file prior to visit.  Allergies  Allergen Reactions  . Bactrim [Sulfamethoxazole-Trimethoprim] Anaphylaxis  . Gadolinium Derivatives Anaphylaxis  . Iodinated Diagnostic Agents Anaphylaxis  . Other Nausea And Vomiting, Rash and Hives    Uncoded Allergy. Allergen: BEZONZTATE Uncoded Allergy. Allergen: decongestants  . Sulfa Antibiotics Anaphylaxis and Other (See Comments)    Uncoded Allergy. Allergen: decongestants  . Azithromycin Hives  . Clarithromycin Other (See Comments)  . Clindamycin   . Furosemide Other (See Comments)  . Guaifenesin Nausea And Vomiting  . Macrobid  [Nitrofurantoin Monohyd Macro] Swelling    Tongue swelling, chest tightness   . Nitrofurantoin Hives  . Penicillins Hives  . Amoxicillin Rash  . Doxycycline Rash and Other (See Comments)  . Keflex [Cephalexin] Rash and Other (See Comments)  . Levofloxacin Rash    Social History   Social History  . Marital status: Single    Spouse name: N/A  . Number of children: N/A  . Years of education: N/A   Occupational History  . Not on file.   Social History Main Topics  . Smoking status: Never Smoker  . Smokeless tobacco: Never Used  . Alcohol use No  . Drug use: No  . Sexual activity: Yes   Other Topics Concern  . Not on file   Social History Narrative  . No narrative on file    Family History  Problem Relation Age of Onset  . Cancer Mother   . Cancer Father     The following portions of the patient's history were reviewed and updated as appropriate: allergies, current medications, past family history, past medical history, past social history, past surgical history and problem list.  Review of Systems Review of Systems - As noted in the history of present illness   Objective:   BP 138/69   Pulse 76   Ht _0  (1.676 m)   Wt 188 lb 9.6 oz (85.5 kg)   LMP 09/18/2016 (Exact Date)   BMI 30.44 kg/m  Physical exam-deferred   Assessment:   1. Amenorrhea - POCT urine pregnancy - TSH  2. Frequency of urination - POCT urinalysis dipstick - Urine culture  3. Abnormal uterine bleeding (AUB); discontinued OCPs November 2017; no menses until 09/17/2016 - US Pelvis Complete; Future - US Transvaginal Non-OB; Future  4. Cyst of right ovary, on CT scan 3.2 cm - US Pelvis Complete; Future - US Transvaginal Non-OB; Future  5. Weight gain, mid abdominal - TSH - Lipid panel - Hemoglobin A1c - Glucose, random  6. Perimenopause - VITAMIN D 25 Hydroxy (Vit-D Deficiency, Fractures)  7. Screening for breast cancer - MM DIGITAL SCREENING BILATERAL; Future  8.  Family history of ovarian cancer, mom, age 2  9. Family history of colon cancer, maternal grandfather,  22. Anxiety  11. Increased BMI     Plan:   1. Mammogram 2. Pelvic ultrasound 3. Annual screening labs 4. BRCA1/BRCA2, other genetic testing for cancer syndromes based on history ([facility]) 5. Return in 2 weeks for annual exam 6. Consider laparoscopic BSO due to ovarian cancer history, completion of childbearing  A total of 30 minutes were spent face-to-face with the patient during the encounter with greater than 50% dealing with counseling and coordination of care.  Brayton Mars, MD  Note: This dictation was prepared with Dragon dictation along with smaller phrase technology. Any transcriptional errors that result from this process are unintentional.

## 2016-09-21 LAB — URINE CULTURE: Organism ID, Bacteria: NO GROWTH

## 2016-09-23 ENCOUNTER — Other Ambulatory Visit: Payer: Medicaid Other

## 2016-09-29 ENCOUNTER — Other Ambulatory Visit: Payer: Medicaid Other

## 2016-11-24 ENCOUNTER — Other Ambulatory Visit: Payer: Medicaid Other

## 2017-02-04 ENCOUNTER — Ambulatory Visit
Admission: EM | Admit: 2017-02-04 | Discharge: 2017-02-04 | Disposition: A | Discharge status: Home / Self Care | Payer: Medicaid Other | Attending: Family Medicine | Admitting: Family Medicine

## 2017-02-04 ENCOUNTER — Encounter: Payer: Self-pay | Admitting: Emergency Medicine

## 2017-02-04 DIAGNOSIS — M545 Low back pain: Secondary | ICD-10-CM | POA: Insufficient documentation

## 2017-02-04 DIAGNOSIS — N3001 Acute cystitis with hematuria: Secondary | ICD-10-CM | POA: Diagnosis not present

## 2017-02-04 DIAGNOSIS — R600 Localized edema: Secondary | ICD-10-CM | POA: Diagnosis not present

## 2017-02-04 LAB — URINALYSIS, COMPLETE (UACMP) WITH MICROSCOPIC
Bilirubin Urine: NEGATIVE
GLUCOSE, UA: NEGATIVE mg/dL
Ketones, ur: NEGATIVE mg/dL
Leukocytes, UA: NEGATIVE
Nitrite: NEGATIVE
PROTEIN: NEGATIVE mg/dL
SPECIFIC GRAVITY, URINE: 1.01 (ref 1.005–1.030)
pH: 6 (ref 5.0–8.0)

## 2017-02-04 MED ORDER — CLINDAMYCIN HCL 300 MG PO CAPS: 300.0000 mg | ORAL_CAPSULE | Freq: Three times a day (TID) | ORAL | 0 refills | Status: DC

## 2017-02-04 NOTE — ED Provider Notes (Signed)
[facility]    CSN: [id number] Arrival date & time: 02/04/17  2202     History   Chief Complaint Chief Complaint  Patient presents with  . Leg Swelling  . Back Pain    HPI Shirley [name] is a 46 y.o. female.   46 yo female with a 2 days h/o lower back pain and swelling of her lower legs and feet. States has a h/o UTIs without typical symptoms. Denies any fevers, chills, injuries, dysuria, calf pain, chest pain, shortness of breath.  States has been eating fast food recently in last few days. Also states has some "thyroid problem" she following up with her PCP.    The history is provided by the patient.  Back Pain    Past Medical History:  Diagnosis Date  . Abnormal Pap smear of cervix   . ADHD (attention deficit hyperactivity disorder)   . Anxiety   . Mitral valve prolapse   . Spina bifida occulta   . UTI (urinary tract infection)     Patient Active Problem List   Diagnosis Date Noted  . Perimenopause 09/19/2016  . Family history of ovarian cancer 09/19/2016  . Family history of colon cancer 09/19/2016  . Anxiety 09/19/2016  . Increased BMI 09/19/2016  . Cyst of right ovary 09/19/2016  . Abnormal uterine bleeding (AUB) 09/19/2016  . Frequency of urination 09/19/2016  . Status post LEEP (loop electrosurgical excision procedure) of cervix 09/19/2016    Past Surgical History:  Procedure Laterality Date  . LEEP    . 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 Authorizing Provider  cetirizine (ZYRTEC) 10 MG tablet Take 10 mg by mouth daily.   Yes [provider]  ALPRAZolam Duanne Moron) 1 MG tablet Take 1 mg by mouth 3 (three)  times daily as needed for anxiety.    [provider]  clindamycin (CLEOCIN) 300 MG capsule Take 1 capsule (300 mg total) by mouth 3 (three) times daily. 02/04/17   Norval Gable, MD  fluticasone (FLONASE) 50 MCG/ACT nasal spray Place 2 sprays into both nostrils daily as needed. 01/26/15   [provider]  ibuprofen (ADVIL,MOTRIN) 400 MG tablet Take 1 tablet (400 mg total) by mouth every 6 (six) hours as needed. 09/05/16   Daymon Larsen, MD  Norgestimate-Ethinyl Estradiol Triphasic (TRI-SPRINTEC) 0.18/0.215/0.25 MG-35 MCG tablet Take 1 tablet by mouth daily.    [provider]    Family History Family History  Problem Relation Age of Onset  . Cancer Mother   . Cancer Father     Social History Social History  Substance Use Topics  . Smoking status: Never Smoker  . Smokeless tobacco: Never Used  . Alcohol use No     Allergies   Bactrim [sulfamethoxazole-trimethoprim]; Gadolinium derivatives; Iodinated diagnostic agents; Other; Sulfa antibiotics; Azithromycin; Clarithromycin; Clindamycin; Furosemide; Guaifenesin; Macrobid [nitrofurantoin monohyd macro]; Nitrofurantoin; Penicillins; Amoxicillin; Doxycycline; Keflex [cephalexin]; and Levofloxacin   Review of Systems Review of Systems  Musculoskeletal: Positive for back  pain.     Physical Exam Triage Vital Signs ED Triage Vitals  Enc Vitals Group     BP 02/04/17 1906 (!) 148/66     Pulse Rate 02/04/17 1906 96     Resp 02/04/17 1906 16     Temp 02/04/17 1906 98.2 F (36.8 C)     Temp Source 02/04/17 1906 Oral     SpO2 02/04/17 1906 98 %     Weight 02/04/17 1906 194 lb 14.2 oz (88.4 kg)     Height --      Head Circumference --      Peak Flow --      Pain Score 02/04/17 1907 2     Pain Loc --      Pain Edu? --      Excl. in [place]? --    No data found.   Updated Vital Signs BP (!) 148/66 (BP Location: Left Arm)   Pulse 96   Temp 98.2 F (36.8 C) (Oral)   Resp 16   Wt 194 lb 14.2 oz (88.4 kg)    LMP 01/24/2017 (Exact Date)   SpO2 98%   BMI 31.46 kg/m   Visual Acuity Right Eye Distance:   Left Eye Distance:   Bilateral Distance:    Right Eye Near:   Left Eye Near:    Bilateral Near:     Physical Exam  Constitutional: She appears well-developed and well-nourished. No distress.  Abdominal: Soft. Bowel sounds are normal. She exhibits no distension and no mass. There is tenderness (mild suprapubic). There is no rebound and no guarding.  Musculoskeletal: She exhibits edema (trace-1+ bilateral tibial/pedal edema). She exhibits no tenderness.  Skin: She is not diaphoretic.  Nursing note and vitals reviewed.    UC Treatments / Results  Labs (all labs ordered are listed, but only abnormal results are displayed) Labs Reviewed  URINALYSIS, COMPLETE (UACMP) WITH MICROSCOPIC - Abnormal; Notable for the following:       Result Value   Hgb urine dipstick SMALL (*)    Squamous Epithelial / LPF 0-5 (*)    Bacteria, UA MANY (*)    All other components within normal limits    EKG  EKG Interpretation None       Radiology No results found.  Procedures Procedures (including critical care time)  Medications Ordered in UC Medications - No data to display   Initial Impression / Assessment and Plan / UC Course  I have reviewed the triage vital signs and the nursing notes.  Pertinent labs & imaging results that were available during my care of the patient were reviewed by me and considered in my medical decision making (see chart for details).       Final Clinical Impressions(s) / UC Diagnoses   Final diagnoses:  Acute cystitis with hematuria  Bilateral lower extremity edema    New Prescriptions Discharge Medication List as of 02/04/2017  7:51 PM    START taking these medications   Details  clindamycin (CLEOCIN) 300 MG capsule Take 1 capsule (300 mg total) by mouth 3 (three) times daily., Starting Wed 02/04/2017, Print       1. Lab results and diagnosis  reviewed with patient 2. rx as per orders above; reviewed possible side effects, interactions, risks and benefits; (patient allergic to multiple antibiotics)  3. Recommend supportive treatment with increase fluids; decrease salt intake 4. Follow-up with PCP 5. F/u here prn   Norval Gable, MD 02/04/17 2030

## 2017-02-04 NOTE — ED Triage Notes (Signed)
Patient c/o lower back pain  and swelling in her abdomen and legs for the past 2 days.  Patient also reports pain in her foot from the swelling.

## 2017-03-23 DIAGNOSIS — Z3200 Encounter for pregnancy test, result unknown: Secondary | ICD-10-CM | POA: Diagnosis not present

## 2017-03-23 DIAGNOSIS — N390 Urinary tract infection, site not specified: Secondary | ICD-10-CM | POA: Diagnosis not present

## 2017-06-04 ENCOUNTER — Other Ambulatory Visit: Payer: Self-pay

## 2017-06-04 ENCOUNTER — Emergency Department: Payer: Medicaid Other

## 2017-06-04 ENCOUNTER — Emergency Department
Admission: EM | Admit: 2017-06-04 | Discharge: 2017-06-04 | Disposition: A | Discharge status: Home / Self Care | Payer: Medicaid Other | Attending: Emergency Medicine | Admitting: Emergency Medicine

## 2017-06-04 ENCOUNTER — Encounter: Payer: Self-pay | Admitting: Emergency Medicine

## 2017-06-04 ENCOUNTER — Ambulatory Visit
Admission: EM | Admit: 2017-06-04 | Discharge: 2017-06-04 | Disposition: A | Discharge status: Other Acute Inpatient Hospital | Payer: Medicaid Other | Attending: Family Medicine | Admitting: Family Medicine

## 2017-06-04 DIAGNOSIS — R1031 Right lower quadrant pain: Secondary | ICD-10-CM | POA: Diagnosis not present

## 2017-06-04 DIAGNOSIS — Z8041 Family history of malignant neoplasm of ovary: Secondary | ICD-10-CM | POA: Insufficient documentation

## 2017-06-04 DIAGNOSIS — R350 Frequency of micturition: Secondary | ICD-10-CM | POA: Insufficient documentation

## 2017-06-04 DIAGNOSIS — Q760 Spina bifida occulta: Secondary | ICD-10-CM | POA: Diagnosis not present

## 2017-06-04 DIAGNOSIS — F909 Attention-deficit hyperactivity disorder, unspecified type: Secondary | ICD-10-CM | POA: Diagnosis not present

## 2017-06-04 DIAGNOSIS — F419 Anxiety disorder, unspecified: Secondary | ICD-10-CM | POA: Diagnosis not present

## 2017-06-04 DIAGNOSIS — Z87440 Personal history of urinary (tract) infections: Secondary | ICD-10-CM | POA: Diagnosis not present

## 2017-06-04 DIAGNOSIS — N200 Calculus of kidney: Secondary | ICD-10-CM | POA: Diagnosis not present

## 2017-06-04 DIAGNOSIS — Z888 Allergy status to other drugs, medicaments and biological substances status: Secondary | ICD-10-CM | POA: Diagnosis not present

## 2017-06-04 DIAGNOSIS — R102 Pelvic and perineal pain: Secondary | ICD-10-CM | POA: Diagnosis not present

## 2017-06-04 DIAGNOSIS — R109 Unspecified abdominal pain: Secondary | ICD-10-CM | POA: Diagnosis present

## 2017-06-04 DIAGNOSIS — Z882 Allergy status to sulfonamides status: Secondary | ICD-10-CM | POA: Diagnosis not present

## 2017-06-04 DIAGNOSIS — Z880 Allergy status to penicillin: Secondary | ICD-10-CM | POA: Diagnosis not present

## 2017-06-04 DIAGNOSIS — Z79899 Other long term (current) drug therapy: Secondary | ICD-10-CM | POA: Insufficient documentation

## 2017-06-04 DIAGNOSIS — Z91041 Radiographic dye allergy status: Secondary | ICD-10-CM | POA: Insufficient documentation

## 2017-06-04 DIAGNOSIS — Z800 Family history of malignant neoplasm of digestive organs: Secondary | ICD-10-CM | POA: Insufficient documentation

## 2017-06-04 DIAGNOSIS — Z881 Allergy status to other antibiotic agents status: Secondary | ICD-10-CM | POA: Insufficient documentation

## 2017-06-04 LAB — URINALYSIS, COMPLETE (UACMP) WITH MICROSCOPIC
BILIRUBIN URINE: NEGATIVE
Bilirubin Urine: NEGATIVE
GLUCOSE, UA: NEGATIVE mg/dL
Glucose, UA: NEGATIVE mg/dL
KETONES UR: NEGATIVE mg/dL
KETONES UR: NEGATIVE mg/dL
LEUKOCYTES UA: NEGATIVE
NITRITE: NEGATIVE
Nitrite: NEGATIVE
PH: 7 (ref 5.0–8.0)
PROTEIN: NEGATIVE mg/dL
PROTEIN: 30 mg/dL — AB
Specific Gravity, Urine: 1.01 (ref 1.005–1.030)
Specific Gravity, Urine: 1.002 — ABNORMAL LOW (ref 1.005–1.030)
pH: 7 (ref 5.0–8.0)

## 2017-06-04 LAB — BASIC METABOLIC PANEL
ANION GAP: 10 (ref 5–15)
BUN: 10 mg/dL (ref 6–20)
CALCIUM: 9.6 mg/dL (ref 8.9–10.3)
CO2: 27 mmol/L (ref 22–32)
Chloride: 103 mmol/L (ref 101–111)
Creatinine, Ser: 0.67 mg/dL (ref 0.44–1.00)
Glucose, Bld: 87 mg/dL (ref 65–99)
POTASSIUM: 3.6 mmol/L (ref 3.5–5.1)
Sodium: 140 mmol/L (ref 135–145)

## 2017-06-04 LAB — CBC
HCT: 38.7 % (ref 35.0–47.0)
Hemoglobin: 12.9 g/dL (ref 12.0–16.0)
MCH: 29 pg (ref 26.0–34.0)
MCHC: 33.5 g/dL (ref 32.0–36.0)
MCV: 86.8 fL (ref 80.0–100.0)
PLATELETS: 293 10*3/uL (ref 150–440)
RBC: 4.46 MIL/uL (ref 3.80–5.20)
RDW: 13.5 % (ref 11.5–14.5)
WBC: 7.2 10*3/uL (ref 3.6–11.0)

## 2017-06-04 LAB — PREGNANCY, URINE: Preg Test, Ur: NEGATIVE

## 2017-06-04 MED ORDER — FOSFOMYCIN TROMETHAMINE 3 G PO PACK
3.0000 g | PACK | Freq: Once | ORAL | Status: AC
  Administered 2017-06-04: 3 g via ORAL

## 2017-06-04 MED ORDER — FOSFOMYCIN TROMETHAMINE 3 G PO PACK
PACK | ORAL | Status: AC
  Filled 2017-06-04: qty 3

## 2017-06-04 MED ORDER — OXYCODONE-ACETAMINOPHEN 5-325 MG PO TABS: 1.0000 | ORAL_TABLET | Freq: Three times a day (TID) | ORAL | 0 refills | Status: DC | PRN

## 2017-06-04 NOTE — Discharge Instructions (Signed)
Go directly to emergency room.  

## 2017-06-04 NOTE — ED Triage Notes (Signed)
Pt to ED c/o right abd pain radiating to right flank since yesterday with urinary pain and frequency.

## 2017-06-04 NOTE — ED Notes (Signed)
Pt has right lower abd pain since yesterday.  Pt has diarrhea.  No vomiting or nausea. Denies urinary sx.  Menses now.   Pt has low back pain

## 2017-06-04 NOTE — ED Notes (Signed)
Error in charting under Lattie Haw, EDT.  Triage was completed and charted by Annie Main, RN.

## 2017-06-04 NOTE — ED Provider Notes (Signed)
[facility] Emergency Department Provider Note       Time seen: ----------------------------------------- 8:19 PM on 06/04/2017 -----------------------------------------    I have reviewed the triage vital signs and the nursing notes.  HISTORY   Chief Complaint Abdominal Pain and Flank Pain    HPI Shirley [name] is a 46 y.o. female with a history of UTI, renal colic and ovarian cysts who presents to the ED for right-sided abdominal pain that radiates since yesterday.  Pain is 10 out of 10, laying on her right side seems to make it worse.  Nothing makes it better.  She denies fevers, chills, vomiting.  She does have difficulty making urine she states.  Past Medical History:  Diagnosis Date  . Abnormal Pap smear of cervix   . ADHD (attention deficit hyperactivity disorder)   . Anxiety   . Mitral valve prolapse   . Spina bifida occulta   . UTI (urinary tract infection)     Patient Active Problem List   Diagnosis Date Noted  . Perimenopause 09/19/2016  . Family history of ovarian cancer 09/19/2016  . Family history of colon cancer 09/19/2016  . Anxiety 09/19/2016  . Increased BMI 09/19/2016  . Cyst of right ovary 09/19/2016  . Abnormal uterine bleeding (AUB) 09/19/2016  . Frequency of urination 09/19/2016  . Status post LEEP (loop electrosurgical excision procedure) of cervix 09/19/2016    Past Surgical History:  Procedure Laterality Date  . LEEP    . LEEP      Allergies Bactrim [sulfamethoxazole-trimethoprim]; Gadolinium derivatives; Iodinated diagnostic agents; Other; Sulfa antibiotics; Azithromycin; Clarithromycin; Clindamycin; Furosemide; Guaifenesin; Macrobid [nitrofurantoin monohyd macro]; Nitrofurantoin; Penicillins; Amoxicillin; Doxycycline; Keflex [cephalexin]; and Levofloxacin  Social History Social History   Tobacco Use  . Smoking status: Never Smoker  . Smokeless tobacco: Never Used  Substance Use Topics  . Alcohol use: No   . Drug use: No    Review of Systems Constitutional: Negative for fever. Eyes: Negative for vision changes ENT:  Negative for congestion, sore throat Cardiovascular: Negative for chest pain. Respiratory: Negative for shortness of breath. Gastrointestinal: Positive for abdominal pain Genitourinary: positive for urinary hesitancy Musculoskeletal: Negative for back pain. Skin: Negative for rash. Neurological: Negative for headaches, focal weakness or numbness.  All systems negative/normal/unremarkable except as stated in the HPI  ____________________________________________   PHYSICAL EXAM:  VITAL SIGNS: ED Triage Vitals  Enc Vitals Group     BP 06/04/17 1923 (!) 144/73     Pulse Rate 06/04/17 1923 79     Resp 06/04/17 1923 16     Temp 06/04/17 1923 98.8 F (37.1 C)     Temp Source 06/04/17 1923 Oral     SpO2 06/04/17 1923 100 %     Weight 06/04/17 1923 195 lb (88.5 kg)     Height 06/04/17 1923 5\' 6"  (1.676 m)     Head Circumference --      Peak Flow --      Pain Score 06/04/17 1925 10     Pain Loc --      Pain Edu? --      Excl. in [place]? --     Constitutional: Alert and oriented. Well appearing and in no distress. Eyes: Conjunctivae are normal. Normal extraocular movements. ENT   Head: Normocephalic and atraumatic.   Nose: No congestion/rhinnorhea.   Mouth/Throat: Mucous membranes are moist.   Neck: No stridor. Cardiovascular: Normal rate, regular rhythm. No murmurs, rubs, or gallops. Respiratory: Normal respiratory effort without tachypnea nor retractions.  Breath sounds are clear and equal bilaterally. No wheezes/rales/rhonchi. Gastrointestinal: Right flank tenderness, no rebound or guarding.  Normal bowel sounds. Musculoskeletal: Nontender with normal range of motion in extremities. No lower extremity tenderness nor edema. Neurologic:  Normal speech and language. No gross focal neurologic deficits are appreciated.  Skin:  Skin is warm, dry and intact.  No rash noted. Psychiatric: Mood and affect are normal. Speech and behavior are normal.  ____________________________________________  ED COURSE:  Pertinent labs & imaging results that were available during my care of the patient were reviewed by me and considered in my medical decision making (see chart for details). Patient presents for flank pain, we will assess with labs and imaging as indicated.   Procedures ____________________________________________   LABS (pertinent positives/negatives)  Labs Reviewed  URINALYSIS, COMPLETE (UACMP) WITH MICROSCOPIC - Abnormal; Notable for the following components:      Result Value   Color, Urine YELLOW (*)    APPearance CLEAR (*)    Specific Gravity, Urine 1.002 (*)    Hgb urine dipstick LARGE (*)    Protein, ur 30 (*)    Leukocytes, UA TRACE (*)    Bacteria, UA FEW (*)    Squamous Epithelial / LPF 6-30 (*)    All other components within normal limits  BASIC METABOLIC PANEL  CBC  PREGNANCY, URINE    RADIOLOGY Images were viewed by me  CT renal protocol IMPRESSION: 1. Stable exam without new or acute interval findings. 2. Tiny calcification projecting in the region of the urethral orifice along the bladder surface. Given that this has remained unchanged for more than 2 years, it is un likely to represent a free stone in the bladder. Mucosal or submucosal calcification/stone would be a consideration. 3. 4.3 cm right adnexal cyst, similar but slightly progressed since 02/16/2015. ____________________________________________  DIFFERENTIAL DIAGNOSIS   Renal colic, UTI, pyelonephritis, muscle strain, gas pain, appendicitis, biliary colic  FINAL ASSESSMENT AND PLAN  Renal colic, UTI   Plan: Patient had presented for flank pain which is likely related to renal colic or recently passed stone. Patient's labs were concerning for some degree of infection and she was given a dose of fosfomycin here. Patient's imaging reveals a  right-sided ovarian cyst which she has had for some time.  Clinically I do not think she has ovarian torsion as her abdominal exam is very benign she will be referred to GYN for outpatient follow-up.   Earleen Newport, MD   Note: This note was generated in part or whole with voice recognition software. Voice recognition is usually quite accurate but there are transcription errors that can and very often do occur. I apologize for any typographical errors that were not detected and corrected.     Earleen Newport, MD 06/04/17 2159

## 2017-06-04 NOTE — ED Provider Notes (Signed)
[facility] ____________________________________________  Time seen: Approximately 6:05 PM  I have reviewed the triage vital signs and the nursing notes.   HISTORY  Chief Complaint Abdominal Pain (right side)  HPI Shirley [name] is a 46 y.o. female presenting for evaluation of 2-day onset of right flank and to right lower quadrant abdominal pain, and patient reports as the days progressed she has had increase of focal area and right lower quadrant pain.  Patient states that her initial noticing the pain while she was cleaning her house and pain was worse with movement, but reports pain is now constant.  States she does still have intermittent waves, and states just prior to arrival she had an increased sharp pain that had her doubled over.  Patient states pain is currently moderate.  States pain is worse with walking as well as movement and direct palpation to abdomen.  States did have a loose bowel movement earlier today, but no other loose stool.  Denies vomiting, some nausea.  Denies accompanying fevers.  States did take some Tylenol earlier today to help with the pain, no resolution.  Denies vaginal complaints.  Did report some urinary frequency.  States does not like a previous kidney stone.  No other over-the-counter medications taken prior to arrival.Denies chest pain, shortness of breath, or rash. Denies recent sickness. Denies recent antibiotic use.  Patient states that she called her primary care doctor prior to coming in and was told to go right away to the ER or walk-in clinic.  Lorelee Market, MD: PCP   Past Medical History:  Diagnosis Date  . Abnormal Pap smear of cervix   . ADHD (attention deficit hyperactivity disorder)   . Anxiety   . Mitral valve prolapse   . Spina bifida occulta   . UTI (urinary tract infection)     Patient Active Problem List   Diagnosis Date Noted  . Perimenopause 09/19/2016  . Family history of ovarian cancer 09/19/2016  . Family  history of colon cancer 09/19/2016  . Anxiety 09/19/2016  . Increased BMI 09/19/2016  . Cyst of right ovary 09/19/2016  . Abnormal uterine bleeding (AUB) 09/19/2016  . Frequency of urination 09/19/2016  . Status post LEEP (loop electrosurgical excision procedure) of cervix 09/19/2016    Past Surgical History:  Procedure Laterality Date  . LEEP    . LEEP       No current facility-administered medications for this encounter.   Current Outpatient Medications:  .  amphetamine-dextroamphetamine (ADDERALL) 20 MG tablet, Take 40 mg daily by mouth., Disp: , Rfl:  .  ALPRAZolam (XANAX) 1 MG tablet, Take 1 mg by mouth 3 (three) times daily as needed for anxiety., Disp: , Rfl:  .  cetirizine (ZYRTEC) 10 MG tablet, Take 10 mg by mouth daily., Disp: , Rfl:  .  clindamycin (CLEOCIN) 300 MG capsule, Take 1 capsule (300 mg total) by mouth 3 (three) times daily., Disp: 21 capsule, Rfl: 0 .  fluticasone (FLONASE) 50 MCG/ACT nasal spray, Place 2 sprays into both nostrils daily as needed., Disp: , Rfl: 5 .  ibuprofen (ADVIL,MOTRIN) 400 MG tablet, Take 1 tablet (400 mg total) by mouth every 6 (six) hours as needed., Disp: 30 tablet, Rfl: 0 .  Norgestimate-Ethinyl Estradiol Triphasic (TRI-SPRINTEC) 0.18/0.215/0.25 MG-35 MCG tablet, Take 1 tablet by mouth daily., Disp: , Rfl:   Allergies Bactrim [sulfamethoxazole-trimethoprim]; Gadolinium derivatives; Iodinated diagnostic agents; Other; Sulfa antibiotics; Azithromycin; Clarithromycin; Clindamycin; Furosemide; Guaifenesin; Macrobid [nitrofurantoin monohyd macro]; Nitrofurantoin; Penicillins; Amoxicillin; Doxycycline; Keflex [cephalexin]; and  Levofloxacin  Family History  Problem Relation Age of Onset  . Cancer Mother   . Cancer Father     Social History Social History   Tobacco Use  . Smoking status: Never Smoker  . Smokeless tobacco: Never Used  Substance Use Topics  . Alcohol use: No  . Drug use: No    Review of Systems Constitutional: No  fever/chills Cardiovascular: Denies chest pain. Respiratory: Denies shortness of breath. Gastrointestinal: As above. Genitourinary: as above.  Musculoskeletal: Negative for back pain. Skin: Negative for rash.   ____________________________________________   PHYSICAL EXAM:  VITAL SIGNS: ED Triage Vitals  Enc Vitals Group     BP 06/04/17 1801 139/82     Pulse Rate 06/04/17 1801 90     Resp 06/04/17 1801 16     Temp 06/04/17 1801 98.2 F (36.8 C)     Temp Source 06/04/17 1801 Oral     SpO2 06/04/17 1801 99 %     Weight 06/04/17 1758 195 lb (88.5 kg)     Height --      Head Circumference --      Peak Flow --      Pain Score 06/04/17 1758 10     Pain Loc --      Pain Edu? --      Excl. in [place]? --     Constitutional: Alert and oriented. Well appearing and in no acute distress. ENT      Head: Normocephalic and atraumatic.      Mouth/Throat: Mucous membranes are moist. Cardiovascular: Normal rate, regular rhythm. Grossly normal heart sounds.  Good peripheral circulation. Respiratory: Normal respiratory effort without tachypnea nor retractions. Breath sounds are clear and equal bilaterally. No wheezes, rales, rhonchi. Gastrointestinal: Normal bowel sounds. Moderate right lower quadrant abdominal tenderness at McBurney's point. Positive obturators. Mild right upper quadrant tenderness and mild right CVA tenderness.  Abdomen otherwise nontender.  Guarding abdomen.  No rash. Musculoskeletal:   No midline cervical, thoracic or lumbar tenderness to palpation.  Neurologic:  Normal speech and language. Speech is normal. No gait instability.  Skin:  Skin is warm, dry and intact. No rash noted. Psychiatric: Mood and affect are normal. Speech and behavior are normal. Patient exhibits appropriate insight and judgment   ___________________________________________   LABS (all labs ordered are listed, but only abnormal results are displayed)  Labs Reviewed  URINALYSIS, COMPLETE (UACMP)  WITH MICROSCOPIC - Abnormal; Notable for the following components:      Result Value   Hgb urine dipstick MODERATE (*)    Squamous Epithelial / LPF 0-5 (*)    Bacteria, UA RARE (*)    All other components within normal limits    PROCEDURES Procedures   INITIAL IMPRESSION / ASSESSMENT AND PLAN / ED COURSE  Pertinent labs & imaging results that were available during my care of the patient were reviewed by me and considered in my medical decision making (see chart for details).  Overall well-appearing patient. Guarding abdomen. Patient with positive McBurney's point tenderness, and with subjective report, concern for acute appendicitis.  Recommend for patient to proceed to the emergency room for further evaluation and likely imaging.  Patient friend at bedside.  Patient states that her friend will take her to [place] RN triage nurse called and given report.  Patient stable the time of discharge.  Patient declined nausea medicine prior to her discharge. ____________________________________________   FINAL CLINICAL IMPRESSION(S) / ED DIAGNOSES  Final diagnoses:  Right lower quadrant abdominal  pain     ED Discharge Orders    None       Note: This dictation was prepared with Dragon dictation along with smaller phrase technology. Any transcriptional errors that result from this process are unintentional.         Marylene Land, NP 06/04/17 1910

## 2017-06-04 NOTE — ED Triage Notes (Signed)
Patient here for right sided abdominal pain for the past 3 days.  Patient denies N/V.  Patient reports diarrhea.

## 2017-06-09 LAB — POCT PREGNANCY, URINE: Preg Test, Ur: NEGATIVE

## 2017-07-04 ENCOUNTER — Ambulatory Visit
Admission: EM | Admit: 2017-07-04 | Discharge: 2017-07-04 | Disposition: A | Discharge status: Home / Self Care | Payer: Medicaid Other | Attending: Emergency Medicine | Admitting: Emergency Medicine

## 2017-07-04 ENCOUNTER — Other Ambulatory Visit: Payer: Self-pay

## 2017-07-04 DIAGNOSIS — F909 Attention-deficit hyperactivity disorder, unspecified type: Secondary | ICD-10-CM | POA: Insufficient documentation

## 2017-07-04 DIAGNOSIS — I10 Essential (primary) hypertension: Secondary | ICD-10-CM | POA: Insufficient documentation

## 2017-07-04 DIAGNOSIS — I341 Nonrheumatic mitral (valve) prolapse: Secondary | ICD-10-CM | POA: Insufficient documentation

## 2017-07-04 DIAGNOSIS — R21 Rash and other nonspecific skin eruption: Secondary | ICD-10-CM | POA: Diagnosis not present

## 2017-07-04 DIAGNOSIS — J0140 Acute pansinusitis, unspecified: Secondary | ICD-10-CM | POA: Insufficient documentation

## 2017-07-04 DIAGNOSIS — F419 Anxiety disorder, unspecified: Secondary | ICD-10-CM | POA: Diagnosis not present

## 2017-07-04 DIAGNOSIS — J029 Acute pharyngitis, unspecified: Secondary | ICD-10-CM | POA: Diagnosis not present

## 2017-07-04 DIAGNOSIS — J028 Acute pharyngitis due to other specified organisms: Secondary | ICD-10-CM | POA: Diagnosis not present

## 2017-07-04 DIAGNOSIS — R05 Cough: Secondary | ICD-10-CM | POA: Diagnosis present

## 2017-07-04 LAB — RAPID STREP SCREEN (MED CTR MEBANE ONLY): Streptococcus, Group A Screen (Direct): NEGATIVE

## 2017-07-04 MED ORDER — MAGIC MOUTHWASH W/LIDOCAINE: 10.0000 mL | Freq: Three times a day (TID) | ORAL | 0 refills | Status: DC | PRN

## 2017-07-04 MED ORDER — FLUTICASONE PROPIONATE 50 MCG/ACT NA SUSP: 2.0000 | Freq: Every day | NASAL | 0 refills | Status: DC

## 2017-07-04 MED ORDER — IBUPROFEN 600 MG PO TABS: 600.0000 mg | ORAL_TABLET | Freq: Four times a day (QID) | ORAL | 0 refills | Status: DC | PRN

## 2017-07-04 NOTE — ED Triage Notes (Signed)
Patient complains of sore throat, fever, dark green mucus. Patient states that she is currently on Clindamycin. Patient reports that symptoms worsened on Monday.

## 2017-07-04 NOTE — Discharge Instructions (Signed)
Take the medication as written. Return to the ER if you get worse, have a fever >100.4, signs and symptoms we discussed, or for any concerns. You may take 600 mg of motrin with 1 gram of tylenol up to 3-4 times a day as needed for pain. This is an effective combination for pain.  Try some Mucinex, and restart your Flonase.  This may help with your sinusitis.  Use a neti pot or the NeilMed sinus rinse as often as you want to to reduce nasal congestion. Follow the directions on the box.   Go to www.goodrx.com to look up your medications. This will give you a list of where you can find your prescriptions at the most affordable prices. Or you can ask the pharmacist what the cash price is. This is frequently cheaper than going through insurance.

## 2017-07-04 NOTE — ED Provider Notes (Signed)
HPI  SUBJECTIVE:  Shirley [name] is a 46 y.o. female who presents with a dry cough along with fevers T-max 102, dark green nasal congestion, rhinorrhea, postnasal drip for the past 5 or 6 days.  She reports a burning sensation in her throat and cervical lymphadenopathy.  States that she saw "white stuff" on her tonsils.  She has seen her PMD twice, and was started on clindamycin for presumed pharyngitis.  She is on day number 4 out of 10.  She also reports having pinkeye in her left eye with discharge and was started on erythromycin eyedrops yesterday.  She has been taking Delsym, Tylenol, last dose of Tylenol 3-4 hours ago.  Patient states that overall she is getting worse.  There are no aggravating or alleviating factors.  She reports headaches, throat tightness, but denies a sensation of throat swelling shut or difficulty breathing.  No drooling, trismus.  No abdominal pain.  No GERD or allergy symptoms.  No body aches.  She does report a burning, itchy rash over her forearms for the past 2 days.  There is no other rash elsewhere.  She has a past medical history of recurrent strep, mono, ovarian cyst, hypertension, scarlet fever, murmur, alpha-1 antitrypsin deficiency, HSV 2.  No history of diabetes.  She also has multiple antibiotic allergies.  LMP: Now.  [id number], Sabino Gasser, MD     Past Medical History:  Diagnosis Date  . Abnormal Pap smear of cervix   . ADHD (attention deficit hyperactivity disorder)   . Anxiety   . Mitral valve prolapse   . Spina bifida occulta   . UTI (urinary tract infection)     Past Surgical History:  Procedure Laterality Date  . LEEP    . LEEP      Family History  Problem Relation Age of Onset  . Cancer Mother   . Cancer Father     Social History   Tobacco Use  . Smoking status: Never Smoker  . Smokeless tobacco: Never Used  Substance Use Topics  . Alcohol use: No  . Drug use: No    No current facility-administered medications for this  encounter.   Current Outpatient Medications:  .  ALPRAZolam (XANAX) 1 MG tablet, Take 1 mg by mouth 3 (three) times daily as needed for anxiety., Disp: , Rfl:  .  amphetamine-dextroamphetamine (ADDERALL) 20 MG tablet, Take 40 mg daily by mouth., Disp: , Rfl:  .  cetirizine (ZYRTEC) 10 MG tablet, Take 10 mg by mouth daily., Disp: , Rfl:  .  clindamycin (CLEOCIN) 300 MG capsule, Take 1 capsule (300 mg total) by mouth 3 (three) times daily., Disp: 21 capsule, Rfl: 0 .  Norgestimate-Ethinyl Estradiol Triphasic (TRI-SPRINTEC) 0.18/0.215/0.25 MG-35 MCG tablet, Take 1 tablet by mouth daily., Disp: , Rfl:  .  fluticasone (FLONASE) 50 MCG/ACT nasal spray, Place 2 sprays into both nostrils daily., Disp: 16 g, Rfl: 0 .  ibuprofen (ADVIL,MOTRIN) 600 MG tablet, Take 1 tablet (600 mg total) by mouth every 6 (six) hours as needed., Disp: 30 tablet, Rfl: 0 .  magic mouthwash w/lidocaine SOLN, Take 10 mLs by mouth 3 (three) times daily as needed for mouth pain. Swish and spit do not swallow, Disp: 200 mL, Rfl: 0  Allergies  Allergen Reactions  . Bactrim [Sulfamethoxazole-Trimethoprim] Anaphylaxis  . Gadolinium Derivatives Anaphylaxis  . Iodinated Diagnostic Agents Anaphylaxis  . Other Nausea And Vomiting, Rash and Hives    Uncoded Allergy. Allergen: BEZONZTATE Uncoded Allergy. Allergen: decongestants  . Sulfa Antibiotics Anaphylaxis  and Other (See Comments)    Uncoded Allergy. Allergen: decongestants  . Azithromycin Hives  . Clarithromycin Other (See Comments)  . Clindamycin   . Furosemide Other (See Comments)  . Guaifenesin Nausea And Vomiting  . Macrobid [Nitrofurantoin Monohyd Macro] Swelling    Tongue swelling, chest tightness   . Nitrofurantoin Hives  . Penicillins Hives  . Amoxicillin Rash  . Doxycycline Rash and Other (See Comments)  . Keflex [Cephalexin] Rash and Other (See Comments)  . Levofloxacin Rash     ROS  As noted in HPI.   Physical Exam  BP (!) 159/74 (BP Location: Left  Arm)   Pulse 97   Temp 98.5 F (36.9 C) (Oral)   Resp 18   Wt 195 lb (88.5 kg)   SpO2 99%   BMI 31.47 kg/m   Constitutional: Well developed, well nourished, no acute distress Eyes:  EOMI, conjunctiva normal bilaterally HENT: Normocephalic, atraumatic,mucus membranes moist.  Positive erythematous, swollen turbinates with mucoid nasal congestion.  Positive maxillary and frontal sinus tenderness.  Erythematous oropharynx with aphthous ulcers.  Tonsils normal.  No exudates.  Uvula midline.  Positive postnasal drip. Lymph: Positive shotty cervical lymphadenopathy Respiratory: Normal inspiratory effort lungs clear bilaterally Cardiovascular: Normal rate, regular rhythm, no appreciable murmur, rubs, gallops GI: nondistended no splenomegaly  skin: Scattered red macules over her forearms.  No blisters on hands. skin intact Musculoskeletal: no deformities Neurologic: Alert & oriented x 3, no focal neuro deficits Psychiatric: Speech and behavior appropriate   ED Course   Medications - No data to display  Orders Placed This Encounter  Procedures  . Rapid strep screen    Standing Status:   Standing    Number of Occurrences:   1  . Culture, group A strep    Standing Status:   Standing    Number of Occurrences:   1    Results for orders placed or performed during the hospital encounter of 07/04/17 (from the past 24 hour(s))  Rapid strep screen     Status: None   Collection Time: 07/04/17  1:42 PM  Result Value Ref Range   Streptococcus, Group A Screen (Direct) NEGATIVE NEGATIVE  Culture, group A strep     Status: None (Preliminary result)   Collection Time: 07/04/17  1:42 PM  Result Value Ref Range   Specimen Description THROAT    Special Requests NONE Reflexed from [id number]    Culture      TOO YOUNG TO READ Performed at [facility], [place] [address], [place], [place] [address]    Report Status PENDING    No results found.  ED Clinical Impression  Viral  pharyngitis  Acute non-recurrent pansinusitis   ED Assessment/Plan  Rapid strep negative.  Feel that the symptoms are coming from a viral pharyngitis with the aphthous ulcers on her oropharynx.  She does have a rash, but does not appear to be  scarlentiform.  Does not appear to be drug reaction to the clindamycin.  She does have sinus tenderness, however, she has multiple antibiotic allergies including the ones that I would normally prescribe for sinus infection, so we will try some Flonase, saline nasal irrigation.  We will send home with Magic mouthwash, ibuprofen 600 mg with 1 g of Tylenol 3-4 times a day.  She will continue the erythromycin ointment in her left eye.  Follow up with her primary care physician in several days.  To the ER if she gets worse.  Discussed labs, MDM, plan and  followup with patient. Discussed sn/sx that should prompt return to the ED. patient agrees with plan.   Meds ordered this encounter  Medications  . DISCONTD: ibuprofen (ADVIL,MOTRIN) 600 MG tablet    Sig: Take 1 tablet (600 mg total) by mouth every 6 (six) hours as needed.    Dispense:  30 tablet    Refill:  0  . DISCONTD: fluticasone (FLONASE) 50 MCG/ACT nasal spray    Sig: Place 2 sprays into both nostrils daily.    Dispense:  16 g    Refill:  0  . magic mouthwash w/lidocaine SOLN    Sig: Take 10 mLs by mouth 3 (three) times daily as needed for mouth pain. Swish and spit do not swallow    Dispense:  200 mL    Refill:  0  . fluticasone (FLONASE) 50 MCG/ACT nasal spray    Sig: Place 2 sprays into both nostrils daily.    Dispense:  16 g    Refill:  0  . ibuprofen (ADVIL,MOTRIN) 600 MG tablet    Sig: Take 1 tablet (600 mg total) by mouth every 6 (six) hours as needed.    Dispense:  30 tablet    Refill:  0    *This clinic note was created using [employer]. Therefore, there may be occasional mistakes despite careful proofreading.   ?   Melynda Ripple, MD 07/05/17 1152

## 2017-07-07 LAB — CULTURE, GROUP A STREP (THRC)

## 2018-02-02 DIAGNOSIS — M545 Low back pain: Secondary | ICD-10-CM | POA: Diagnosis not present

## 2018-02-02 DIAGNOSIS — N390 Urinary tract infection, site not specified: Secondary | ICD-10-CM | POA: Diagnosis not present

## 2018-02-02 DIAGNOSIS — F419 Anxiety disorder, unspecified: Secondary | ICD-10-CM | POA: Diagnosis not present

## 2018-02-02 DIAGNOSIS — F900 Attention-deficit hyperactivity disorder, predominantly inattentive type: Secondary | ICD-10-CM | POA: Diagnosis not present

## 2018-02-02 DIAGNOSIS — E559 Vitamin D deficiency, unspecified: Secondary | ICD-10-CM | POA: Diagnosis not present

## 2018-02-07 DIAGNOSIS — Z87442 Personal history of urinary calculi: Secondary | ICD-10-CM | POA: Diagnosis not present

## 2018-02-07 DIAGNOSIS — Z881 Allergy status to other antibiotic agents status: Secondary | ICD-10-CM | POA: Diagnosis not present

## 2018-02-07 DIAGNOSIS — R102 Pelvic and perineal pain: Secondary | ICD-10-CM | POA: Diagnosis not present

## 2018-02-07 DIAGNOSIS — Z882 Allergy status to sulfonamides status: Secondary | ICD-10-CM | POA: Diagnosis not present

## 2018-02-07 DIAGNOSIS — D251 Intramural leiomyoma of uterus: Secondary | ICD-10-CM | POA: Diagnosis not present

## 2018-02-07 DIAGNOSIS — N83201 Unspecified ovarian cyst, right side: Secondary | ICD-10-CM | POA: Diagnosis not present

## 2018-02-07 DIAGNOSIS — D259 Leiomyoma of uterus, unspecified: Secondary | ICD-10-CM | POA: Diagnosis not present

## 2018-02-07 DIAGNOSIS — R1031 Right lower quadrant pain: Secondary | ICD-10-CM | POA: Diagnosis not present

## 2018-02-07 DIAGNOSIS — I341 Nonrheumatic mitral (valve) prolapse: Secondary | ICD-10-CM | POA: Diagnosis not present

## 2018-02-07 DIAGNOSIS — Z87891 Personal history of nicotine dependence: Secondary | ICD-10-CM | POA: Diagnosis not present

## 2018-02-07 DIAGNOSIS — Z883 Allergy status to other anti-infective agents status: Secondary | ICD-10-CM | POA: Diagnosis not present

## 2018-02-07 DIAGNOSIS — I10 Essential (primary) hypertension: Secondary | ICD-10-CM | POA: Diagnosis not present

## 2018-02-07 DIAGNOSIS — R350 Frequency of micturition: Secondary | ICD-10-CM | POA: Diagnosis not present

## 2018-02-07 DIAGNOSIS — F419 Anxiety disorder, unspecified: Secondary | ICD-10-CM | POA: Diagnosis not present

## 2018-02-15 ENCOUNTER — Ambulatory Visit: Payer: Medicaid Other | Admitting: Certified Nurse Midwife

## 2018-02-15 ENCOUNTER — Encounter: Payer: Self-pay | Admitting: Certified Nurse Midwife

## 2018-02-15 VITALS — BP 121/70 | HR 83 | Ht 66.0 in | Wt 193.5 lb

## 2018-02-15 DIAGNOSIS — R4586 Emotional lability: Secondary | ICD-10-CM | POA: Diagnosis not present

## 2018-02-15 DIAGNOSIS — D251 Intramural leiomyoma of uterus: Secondary | ICD-10-CM | POA: Diagnosis not present

## 2018-02-15 DIAGNOSIS — R140 Abdominal distension (gaseous): Secondary | ICD-10-CM | POA: Diagnosis not present

## 2018-02-15 DIAGNOSIS — R300 Dysuria: Secondary | ICD-10-CM

## 2018-02-15 DIAGNOSIS — R5383 Other fatigue: Secondary | ICD-10-CM

## 2018-02-15 DIAGNOSIS — R635 Abnormal weight gain: Secondary | ICD-10-CM | POA: Diagnosis not present

## 2018-02-15 DIAGNOSIS — N83201 Unspecified ovarian cyst, right side: Secondary | ICD-10-CM | POA: Diagnosis not present

## 2018-02-15 DIAGNOSIS — D252 Subserosal leiomyoma of uterus: Secondary | ICD-10-CM

## 2018-02-15 DIAGNOSIS — L659 Nonscarring hair loss, unspecified: Secondary | ICD-10-CM | POA: Diagnosis not present

## 2018-02-15 LAB — POCT URINALYSIS DIPSTICK
Bilirubin, UA: NEGATIVE
GLUCOSE UA: NEGATIVE
Ketones, UA: NEGATIVE
Nitrite, UA: NEGATIVE
Protein, UA: NEGATIVE
Spec Grav, UA: 1.01 (ref 1.010–1.025)
Urobilinogen, UA: 0.2 E.U./dL
pH, UA: 6.5 (ref 5.0–8.0)

## 2018-02-15 NOTE — Patient Instructions (Signed)
Ovarian Cyst An ovarian cyst is a fluid-filled sac that forms on an ovary. The ovaries are small organs that produce eggs in women. Various types of cysts can form on the ovaries. Some may cause symptoms and require treatment. Most ovarian cysts go away on their own, are not cancerous (are benign), and do not cause problems. Common types of ovarian cysts include:  Functional (follicle) cysts. ? Occur during the menstrual cycle, and usually go away with the next menstrual cycle if you do not get pregnant. ? Usually cause no symptoms.  Endometriomas. ? Are cysts that form from the tissue that lines the uterus (endometrium). ? Are sometimes called "chocolate cysts" because they become filled with blood that turns brown. ? Can cause pain in the lower abdomen during intercourse and during your period.  Cystadenoma cysts. ? Develop from cells on the outside surface of the ovary. ? Can get very large and cause lower abdomen pain and pain with intercourse. ? Can cause severe pain if they twist or break open (rupture).  Dermoid cysts. ? Are sometimes found in both ovaries. ? May contain different kinds of body tissue, such as skin, teeth, hair, or cartilage. ? Usually do not cause symptoms unless they get very big.  Theca lutein cysts. ? Occur when too much of a certain hormone (human chorionic gonadotropin) is produced and overstimulates the ovaries to produce an egg. ? Are most common after having procedures used to assist with the conception of a baby (in vitro fertilization).  What are the causes? Ovarian cysts may be caused by:  Ovarian hyperstimulation syndrome. This is a condition that can develop from taking fertility medicines. It causes multiple large ovarian cysts to form.  Polycystic ovarian syndrome (PCOS). This is a common hormonal disorder that can cause ovarian cysts, as well as problems with your period or fertility.  What increases the risk? The following factors may make  you more likely to develop ovarian cysts:  Being overweight or obese.  Taking fertility medicines.  Taking certain forms of hormonal birth control.  Smoking.  What are the signs or symptoms? Many ovarian cysts do not cause symptoms. If symptoms are present, they may include:  Pelvic pain or pressure.  Pain in the lower abdomen.  Pain during sex.  Abdominal swelling.  Abnormal menstrual periods.  Increasing pain with menstrual periods.  How is this diagnosed? These cysts are commonly found during a routine pelvic exam. You may have tests to find out more about the cyst, such as:  Ultrasound.  X-ray of the pelvis.  CT scan.  MRI.  Blood tests.  How is this treated? Many ovarian cysts go away on their own without treatment. Your health care provider may want to check your cyst regularly for 2-3 months to see if it changes. If you are in menopause, it is especially important to have your cyst monitored closely because menopausal women have a higher rate of ovarian cancer. When treatment is needed, it may include:  Medicines to help relieve pain.  A procedure to drain the cyst (aspiration).  Surgery to remove the whole cyst.  Hormone treatment or birth control pills. These methods are sometimes used to help dissolve a cyst.  Follow these instructions at home:  Take over-the-counter and prescription medicines only as told by your health care provider.  Do not drive or use heavy machinery while taking prescription pain medicine.  Get regular pelvic exams and Pap tests as often as told by your health care   provider.  Return to your normal activities as told by your health care provider. Ask your health care provider what activities are safe for you.  Do not use any products that contain nicotine or tobacco, such as cigarettes and e-cigarettes. If you need help quitting, ask your health care provider.  Keep all follow-up visits as told by your health care provider.  This is important. Contact a health care provider if:  Your periods are late, irregular, or painful, or they stop.  You have pelvic pain that does not go away.  You have pressure on your bladder or trouble emptying your bladder completely.  You have pain during sex.  You have any of the following in your abdomen: ? A feeling of fullness. ? Pressure. ? Discomfort. ? Pain that does not go away. ? Swelling.  You feel generally ill.  You become constipated.  You lose your appetite.  You develop severe acne.  You start to have more body hair and facial hair.  You are gaining weight or losing weight without changing your exercise and eating habits.  You think you may be pregnant. Get help right away if:  You have abdominal pain that is severe or gets worse.  You cannot eat or drink without vomiting.  You suddenly develop a fever.  Your menstrual period is much heavier than usual. This information is not intended to replace advice given to you by your health care provider. Make sure you discuss any questions you have with your health care provider. Document Released: 07/14/2005 Document Revised: 02/01/2016 Document Reviewed: 12/16/2015 Elsevier Interactive Patient Education  2018 [employer]. Uterine Fibroids Uterine fibroids are tissue masses (tumors) that can develop in the womb (uterus). They are also called leiomyomas. This type of tumor is not cancerous (benign) and does not spread to other parts of the body outside of the pelvic area, which is between the hip bones. Occasionally, fibroids may develop in the fallopian tubes, in the cervix, or on the support structures (ligaments) that surround the uterus. You can have one or many fibroids. Fibroids can vary in size, weight, and where they grow in the uterus. Some can become quite large. Most fibroids do not require medical treatment. What are the causes? A fibroid can develop when a single uterine cell keeps growing  (replicating). Most cells in the human body have a control mechanism that keeps them from replicating without control. What are the signs or symptoms? Symptoms may include:  Heavy bleeding during your period.  Bleeding or spotting between periods.  Pelvic pain and pressure.  Bladder problems, such as needing to urinate more often (urinary frequency) or urgently.  Inability to reproduce offspring (infertility).  Miscarriages.  How is this diagnosed? Uterine fibroids are diagnosed through a physical exam. Your health care provider may feel the lumpy tumors during a pelvic exam. Ultrasonography and an MRI may be done to determine the size, location, and number of fibroids. How is this treated? Treatment may include:  Watchful waiting. This involves getting the fibroid checked by your health care provider to see if it grows or shrinks. Follow your health care provider's recommendations for how often to have this checked.  Hormone medicines. These can be taken by mouth or given through an intrauterine device (IUD).  Surgery. ? Removing the fibroids (myomectomy) or the uterus (hysterectomy). ? Removing blood supply to the fibroids (uterine artery embolization).  If fibroids interfere with your fertility and you want to become pregnant, your health care provider may  recommend having the fibroids removed. Follow these instructions at home:  Keep all follow-up visits as directed by your health care provider. This is important.  Take over-the-counter and prescription medicines only as told by your health care provider. ? If you were prescribed a hormone treatment, take the hormone medicines exactly as directed.  Ask your health care provider about taking iron pills and increasing the amount of dark green, leafy vegetables in your diet. These actions can help to boost your blood iron levels, which may be affected by heavy menstrual bleeding.  Pay close attention to your period and tell  your health care provider about any changes, such as: ? Increased blood flow that requires you to use more pads or tampons than usual per month. ? A change in the number of days that your period lasts per month. ? A change in symptoms that are associated with your period, such as abdominal cramping or back pain. Contact a health care provider if:  You have pelvic pain, back pain, or abdominal cramps that cannot be controlled with medicines.  You have an increase in bleeding between and during periods.  You soak tampons or pads in a half hour or less.  You feel lightheaded, extra tired, or weak. Get help right away if:  You faint.  You have a sudden increase in pelvic pain. This information is not intended to replace advice given to you by your health care provider. Make sure you discuss any questions you have with your health care provider. Document Released: 07/11/2000 Document Revised: 03/13/2016 Document Reviewed: 01/10/2014 Elsevier Interactive Patient Education  [employer].

## 2018-02-16 NOTE — Progress Notes (Signed)
GYN ENCOUNTER NOTE  Subjective:       Shirley [name] is a 47 y.o. [id number] female here for gynecologic evaluation of the following issues:  1. Dysuria 2. Hair loss 3. Abdominal bloating 4. Mood swings 5. Fatigue 6. Weight gain 7. Fibroids 8. Right ovarian cyst  Patient presents for ER follow up visit. Seen at [facility] on 02/07/2018 with complaints of pelvic pain and diagnosed with fibroid uterus and right ovarian cyst. Reports the above symptoms for the last year on and off without relief with home treament measures. Patient states that ER MD told her she "needed a hysterectomy as soon as possible".   Previously saw Dr. Enzo Bi on 09/19/2016 for perimenopausal symptoms, but never returned for annual exam.   Denies difficulty breathing or respiratory distress, chest pain, and leg pain or swelling.   History significant for positive HPV requiring LEEP. Family history significant for ovarian, colon, prostate, and pancreatic cancer.    Gynecologic History  Patient's last menstrual period was 01/20/2018.  Contraception: OCP (estrogen/progesterone)  Last Pap: due.   Last mammogram: due.  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 Abnormal Pap smear of cervix   . ADHD (attention deficit hyperactivity disorder)   . Anxiety   . Mitral valve prolapse   . Spina bifida occulta   . UTI (urinary tract infection)     Past Surgical History:  Procedure Laterality Date  . LEEP    . LEEP      Current Outpatient Medications on File Prior to Visit  Medication Sig Dispense Refill  . ALPRAZolam  (XANAX) 1 MG tablet Take 1 mg by mouth 3 (three) times daily as needed for anxiety.    . cetirizine (ZYRTEC) 10 MG tablet Take 10 mg by mouth daily.    . fluticasone (FLONASE) 50 MCG/ACT nasal spray Place 2 sprays into both nostrils daily. 16 g 0  . Norgestimate-Ethinyl Estradiol Triphasic (TRI-SPRINTEC) 0.18/0.215/0.25 MG-35 MCG tablet Take 1 tablet by mouth daily.    Marland Kitchen amphetamine-dextroamphetamine (ADDERALL) 20 MG tablet Take 40 mg daily by mouth.    . clindamycin (CLEOCIN) 300 MG capsule Take 1 capsule (300 mg total) by mouth 3 (three) times daily. (Patient not taking: Reported on 02/15/2018) 21 capsule 0  . ibuprofen (ADVIL,MOTRIN) 600 MG tablet Take 1 tablet (600 mg total) by mouth every 6 (six) hours as needed. (Patient not taking: Reported on 02/15/2018) 30 tablet 0  . magic mouthwash w/lidocaine SOLN Take 10 mLs by mouth 3 (three) times daily as needed for mouth pain. Swish and spit do not swallow (Patient not taking: Reported on 02/15/2018) 200 mL 0   No current facility-administered medications on file prior to visit.     Allergies  Allergen Reactions  . Bactrim [Sulfamethoxazole-Trimethoprim] Anaphylaxis  . Gadolinium Derivatives Anaphylaxis  . Iodinated Diagnostic Agents Anaphylaxis  . Other Nausea And Vomiting, Rash and Hives    Uncoded  Allergy. Allergen: BEZONZTATE Uncoded Allergy. Allergen: decongestants  . Sulfa Antibiotics Anaphylaxis and Other (See Comments)    Uncoded Allergy. Allergen: decongestants  . Azithromycin Hives  . Clarithromycin Other (See Comments)  . Clindamycin   . Furosemide Other (See Comments)  . Guaifenesin Nausea And Vomiting  . Macrobid [Nitrofurantoin Monohyd Macro] Swelling    Tongue swelling, chest tightness   . Nitrofurantoin Hives  . Penicillins Hives  . Amoxicillin Rash  . Doxycycline Rash and Other (See Comments)  . Keflex [Cephalexin] Rash and Other (See Comments)  . Levofloxacin Rash    Social History   Socioeconomic History  .  Marital status: Single    Spouse name: Not on file  . Number of children: Not on file  . Years of education: Not on file  . Highest education level: Not on file  Occupational History  . Not on file  Social Needs  . Financial resource strain: Not on file  . Food insecurity:    Worry: Not on file    Inability: Not on file  . Transportation needs:    Medical: Not on file    Non-medical: Not on file  Tobacco Use  . Smoking status: Never Smoker  . Smokeless tobacco: Never Used  Substance and Sexual Activity  . Alcohol use: No  . Drug use: No  . Sexual activity: Yes  Lifestyle  . Physical activity:    Days per week: Not on file    Minutes per session: Not on file  . Stress: Not on file  Relationships  . Social connections:    Talks on phone: Not on file    Gets together: Not on file    Attends religious service: Not on file    Active member of club or organization: Not on file    Attends meetings of clubs or organizations: Not on file    Relationship status: Not on file  . Intimate partner violence:    Fear of current or ex partner: Not on file    Emotionally abused: Not on file    Physically abused: Not on file    Forced sexual activity: Not on file  Other Topics Concern  . Not on file  Social History Narrative  . Not on file    Family History  Problem Relation Age of Onset  . Cancer Mother   . Cancer Father     The following portions of the patient's history were reviewed and updated as appropriate: allergies, current medications, past family history, past medical history, past social history, past surgical history and problem list.  Review of Systems  ROS negative except as noted above. Information obtained from patient.   Objective:   BP 121/70   Pulse 83   Ht 5\' 6"  (1.676 m)   Wt 193 lb 8 oz (87.8 kg)   LMP 01/20/2018   BMI 31.23 kg/m   CONSTITUTIONAL: Well-developed, well-nourished female in no acute distress.   EXAM: [place] ENDOVAGINAL  (NON-OB) DATE: 02/07/2018 9:27 PM ACCESSION: [id number] UN DICTATED: 02/07/2018 9:27 PM INTERPRETATION LOCATION: [place]  CLINICAL INDICATION: 47 years old Female with hx of cyst, diffuse lower abdominal/pelvic pain  TECHNIQUE: Ultrasound views of the pelvis were obtained endovaginally using gray scale and color and spectral Doppler imaging.   COMPARISON: None  FINDINGS:  UTERUS/CERVIX: The uterus is retroverted. Heterogeneous myometrium. Multiple fibroids are seen throughout the uterus including a anterior midline intramural fibroid measuring 2.4 x 2.0 x 1.6 cm. Additional anterior subserosal fibroid measures 1.9 x  2.3 x 2.5 cm. The endometrium was normal in thickness.  OVARIES: The ovaries were seen well transvaginally. Bilateral small cystic areas in the ovary. There is a larger right ovarian cyst which measures 4.8 x 3.7 x 4.5 cm. Appropriate flow was seen in both ovaries with color and spectral doppler.  OTHER: Trace free pelvic fluid.  Assessment:   1. Dysuria  - POCT urinalysis dipstick - Urine Culture  2. Hair loss   3. Abdominal bloating   4. Mood swings   5. Other fatigue   6. Weight gain   7. Intramural uterine fibroid   8. Fibroids, subserous   9. Right ovarian cyst  Plan:   Ultrasound findings reviewed with patient. Education regarding management and treatment options; handouts given.   Labs: urine dip and urine culture, see orders.   Reviewed red flag symptoms and when to call.   RTC 1-2 weeks for MD consult with Dr. Marcelline Mates.    Diona Fanti, CNM [facility], [facility] [place]

## 2018-03-05 ENCOUNTER — Encounter: Payer: Self-pay | Admitting: Emergency Medicine

## 2018-03-05 ENCOUNTER — Other Ambulatory Visit: Payer: Self-pay

## 2018-03-05 DIAGNOSIS — D259 Leiomyoma of uterus, unspecified: Secondary | ICD-10-CM | POA: Insufficient documentation

## 2018-03-05 DIAGNOSIS — N939 Abnormal uterine and vaginal bleeding, unspecified: Secondary | ICD-10-CM | POA: Diagnosis not present

## 2018-03-05 DIAGNOSIS — D252 Subserosal leiomyoma of uterus: Secondary | ICD-10-CM | POA: Diagnosis not present

## 2018-03-05 DIAGNOSIS — N938 Other specified abnormal uterine and vaginal bleeding: Secondary | ICD-10-CM | POA: Diagnosis not present

## 2018-03-05 DIAGNOSIS — Z79899 Other long term (current) drug therapy: Secondary | ICD-10-CM | POA: Diagnosis not present

## 2018-03-05 DIAGNOSIS — D251 Intramural leiomyoma of uterus: Secondary | ICD-10-CM | POA: Diagnosis not present

## 2018-03-05 DIAGNOSIS — N83291 Other ovarian cyst, right side: Secondary | ICD-10-CM | POA: Insufficient documentation

## 2018-03-05 DIAGNOSIS — N83201 Unspecified ovarian cyst, right side: Secondary | ICD-10-CM | POA: Diagnosis not present

## 2018-03-05 LAB — URINALYSIS, COMPLETE (UACMP) WITH MICROSCOPIC
Bilirubin Urine: NEGATIVE
Glucose, UA: NEGATIVE mg/dL
Ketones, ur: NEGATIVE mg/dL
Leukocytes, UA: NEGATIVE
Nitrite: NEGATIVE
PH: 6 (ref 5.0–8.0)
Protein, ur: NEGATIVE mg/dL
SPECIFIC GRAVITY, URINE: 1.006 (ref 1.005–1.030)

## 2018-03-05 NOTE — ED Triage Notes (Signed)
Pt reports has vaginal bleeding has ovarian cyst seeing GYN for same MD recommended to be seen in ER.

## 2018-03-06 ENCOUNTER — Emergency Department
Admission: EM | Admit: 2018-03-06 | Discharge: 2018-03-06 | Disposition: A | Discharge status: Home / Self Care | Payer: Medicaid Other | Attending: Emergency Medicine | Admitting: Emergency Medicine

## 2018-03-06 ENCOUNTER — Emergency Department: Payer: Medicaid Other

## 2018-03-06 DIAGNOSIS — D251 Intramural leiomyoma of uterus: Secondary | ICD-10-CM | POA: Diagnosis not present

## 2018-03-06 DIAGNOSIS — D252 Subserosal leiomyoma of uterus: Secondary | ICD-10-CM | POA: Diagnosis not present

## 2018-03-06 DIAGNOSIS — N83209 Unspecified ovarian cyst, unspecified side: Secondary | ICD-10-CM

## 2018-03-06 DIAGNOSIS — N938 Other specified abnormal uterine and vaginal bleeding: Secondary | ICD-10-CM

## 2018-03-06 LAB — POCT PREGNANCY, URINE: Preg Test, Ur: NEGATIVE

## 2018-03-06 MED ORDER — TRANEXAMIC ACID 650 MG PO TABS: 1300.0000 mg | ORAL_TABLET | Freq: Three times a day (TID) | ORAL | 0 refills | Status: AC

## 2018-03-06 MED ORDER — TRANEXAMIC ACID 650 MG PO TABS
1300.0000 mg | ORAL_TABLET | Freq: Once | ORAL | Status: DC
  Filled 2018-03-06: qty 2

## 2018-03-06 NOTE — ED Notes (Signed)
Patient transported to Ultrasound 

## 2018-03-06 NOTE — Discharge Instructions (Signed)
Please take your tranexamic acid 3 times a day for the next 5 days as prescribed to help with your bleeding.  Follow-up with your OB gynecologist on the 20th as scheduled and return to the emergency department sooner for any concerns.  It was a pleasure to take care of you today, and thank you for coming to our emergency department.  If you have any questions or concerns before leaving please ask the nurse to grab me and I'm more than happy to go through your aftercare instructions again.  If you were prescribed any opioid pain medication today such as Norco, Vicodin, Percocet, morphine, hydrocodone, or oxycodone please make sure you do not drive when you are taking this medication as it can alter your ability to drive safely.  If you have any concerns once you are home that you are not improving or are in fact getting worse before you can make it to your follow-up appointment, please do not hesitate to call 911 and come back for further evaluation.  Darel Hong, MD  Results for orders placed or performed during the hospital encounter of 03/06/18  Urinalysis, Complete w Microscopic  Result Value Ref Range   Color, Urine STRAW (A) YELLOW   APPearance CLEAR (A) CLEAR   Specific Gravity, Urine 1.006 1.005 - 1.030   pH 6.0 5.0 - 8.0   Glucose, UA NEGATIVE NEGATIVE mg/dL   Hgb urine dipstick MODERATE (A) NEGATIVE   Bilirubin Urine NEGATIVE NEGATIVE   Ketones, ur NEGATIVE NEGATIVE mg/dL   Protein, ur NEGATIVE NEGATIVE mg/dL   Nitrite NEGATIVE NEGATIVE   Leukocytes, UA NEGATIVE NEGATIVE   RBC / HPF 0-5 0 - 5 RBC/hpf   WBC, UA 0-5 0 - 5 WBC/hpf   Bacteria, UA RARE (A) NONE SEEN   Squamous Epithelial / LPF 0-5 0 - 5  Pregnancy, urine POC  Result Value Ref Range   Preg Test, Ur NEGATIVE NEGATIVE   US Transvaginal Non-ob  Result Date: 03/06/2018 CLINICAL DATA:  Vaginal bleeding with known right ovarian cyst. Pelvic pain. History of LEEP procedure. EXAM: TRANSABDOMINAL AND TRANSVAGINAL  ULTRASOUND OF PELVIS DOPPLER ULTRASOUND OF OVARIES TECHNIQUE: Both transabdominal and transvaginal ultrasound examinations of the pelvis were performed. Transabdominal technique was performed for global imaging of the pelvis including uterus, ovaries, adnexal regions, and pelvic cul-de-sac. It was necessary to proceed with endovaginal exam following the transabdominal exam to visualize the uterus, endometrium and ovaries. Color and duplex Doppler ultrasound was utilized to evaluate blood flow to the ovaries. COMPARISON:  06/04/2017 ultrasound FINDINGS: Uterus Measurements: 9 x 5.1 x 4.9 cm . Retroverted uterus with subserosal 2.2 x 1.9 x 2.2 cm hypoechoic uterine leiomyoma and a 1.7 x 1.7 x 1.8 cm anatomically anterior intramural uterine body fibroid. Endometrium Thickness: 4.6 mm and homogeneous. No focal abnormality visualized. Right ovary Measurements: 5.3 x 3.7 x 4.9 cm. Simple appearing anechoic cyst measuring 4.8 x 4.5 x 4.6 cm slightly increased in size from the 4.3 cm size on the study from 06/04/2017. Left ovary Measurements: 2.3 x 1.3 x 1.2 cm. Normal appearance/no adnexal mass. Pulsed Doppler evaluation of both ovaries demonstrates normal low-resistance arterial and venous waveforms. Other findings No abnormal free fluid. IMPRESSION: 1. Retroverted uterus with intramural and subserosal anatomically anterior leiomyomas as above. 2. Simple right ovarian cyst measuring 4.8 x 4.5 x 4.6 cm almost certainly to be benign without complicating nor concerning features. Electronically Signed   By: Ashley Royalty M.D.   On: 03/06/2018 01:58   US Pelvis  Complete  Result Date: 03/06/2018 CLINICAL DATA:  Vaginal bleeding with known right ovarian cyst. Pelvic pain. History of LEEP procedure. EXAM: TRANSABDOMINAL AND TRANSVAGINAL ULTRASOUND OF PELVIS DOPPLER ULTRASOUND OF OVARIES TECHNIQUE: Both transabdominal and transvaginal ultrasound examinations of the pelvis were performed. Transabdominal technique was performed  for global imaging of the pelvis including uterus, ovaries, adnexal regions, and pelvic cul-de-sac. It was necessary to proceed with endovaginal exam following the transabdominal exam to visualize the uterus, endometrium and ovaries. Color and duplex Doppler ultrasound was utilized to evaluate blood flow to the ovaries. COMPARISON:  06/04/2017 ultrasound FINDINGS: Uterus Measurements: 9 x 5.1 x 4.9 cm . Retroverted uterus with subserosal 2.2 x 1.9 x 2.2 cm hypoechoic uterine leiomyoma and a 1.7 x 1.7 x 1.8 cm anatomically anterior intramural uterine body fibroid. Endometrium Thickness: 4.6 mm and homogeneous. No focal abnormality visualized. Right ovary Measurements: 5.3 x 3.7 x 4.9 cm. Simple appearing anechoic cyst measuring 4.8 x 4.5 x 4.6 cm slightly increased in size from the 4.3 cm size on the study from 06/04/2017. Left ovary Measurements: 2.3 x 1.3 x 1.2 cm. Normal appearance/no adnexal mass. Pulsed Doppler evaluation of both ovaries demonstrates normal low-resistance arterial and venous waveforms. Other findings No abnormal free fluid. IMPRESSION: 1. Retroverted uterus with intramural and subserosal anatomically anterior leiomyomas as above. 2. Simple right ovarian cyst measuring 4.8 x 4.5 x 4.6 cm almost certainly to be benign without complicating nor concerning features. Electronically Signed   By: Ashley Royalty M.D.   On: 03/06/2018 01:58   [place] Art/ven Flow Abd Pelv Doppler  Result Date: 03/06/2018 CLINICAL DATA:  Vaginal bleeding with known right ovarian cyst. Pelvic pain. History of LEEP procedure. EXAM: TRANSABDOMINAL AND TRANSVAGINAL ULTRASOUND OF PELVIS DOPPLER ULTRASOUND OF OVARIES TECHNIQUE: Both transabdominal and transvaginal ultrasound examinations of the pelvis were performed. Transabdominal technique was performed for global imaging of the pelvis including uterus, ovaries, adnexal regions, and pelvic cul-de-sac. It was necessary to proceed with endovaginal exam following the transabdominal  exam to visualize the uterus, endometrium and ovaries. Color and duplex Doppler ultrasound was utilized to evaluate blood flow to the ovaries. COMPARISON:  06/04/2017 ultrasound FINDINGS: Uterus Measurements: 9 x 5.1 x 4.9 cm . Retroverted uterus with subserosal 2.2 x 1.9 x 2.2 cm hypoechoic uterine leiomyoma and a 1.7 x 1.7 x 1.8 cm anatomically anterior intramural uterine body fibroid. Endometrium Thickness: 4.6 mm and homogeneous. No focal abnormality visualized. Right ovary Measurements: 5.3 x 3.7 x 4.9 cm. Simple appearing anechoic cyst measuring 4.8 x 4.5 x 4.6 cm slightly increased in size from the 4.3 cm size on the study from 06/04/2017. Left ovary Measurements: 2.3 x 1.3 x 1.2 cm. Normal appearance/no adnexal mass. Pulsed Doppler evaluation of both ovaries demonstrates normal low-resistance arterial and venous waveforms. Other findings No abnormal free fluid. IMPRESSION: 1. Retroverted uterus with intramural and subserosal anatomically anterior leiomyomas as above. 2. Simple right ovarian cyst measuring 4.8 x 4.5 x 4.6 cm almost certainly to be benign without complicating nor concerning features. Electronically Signed   By: Ashley Royalty M.D.   On: 03/06/2018 01:58

## 2018-03-06 NOTE — ED Provider Notes (Signed)
[facility] Emergency Department Provider Note  ____________________________________________   None    (approximate)  I have reviewed the triage vital signs and the nursing notes.   HISTORY  Chief Complaint Vaginal Bleeding and Ovarian Cyst   HPI Shirley [name] is a 47 y.o. female comes to the emergency department for evaluation of  right-sided ovarian cyst and fibroids.  The patient has had constant cramping aching suprapubic and right sided abdominal discomfort for "a long time".  He has had some recent vaginal bleeding that has been in between periods which concerned her and prompted the visit today.  She does follow-up with OB gynecology and is scheduled for surgery in the near future.  She came to the emergency department today because of the bleeding and for a second opinion.  Her pain is constant cramping aching nonradiating moderate severity and nothing seems to make it better or worse.   Past Medical History:  Diagnosis Date  . Abnormal Pap smear of cervix   . ADHD (attention deficit hyperactivity disorder)   . Anxiety   . Mitral valve prolapse   . Spina bifida occulta   . UTI (urinary tract infection)     Patient Active Problem List   Diagnosis Date Noted  . Perimenopause 09/19/2016  . Family history of ovarian cancer 09/19/2016  . Family history of colon cancer 09/19/2016  . Anxiety 09/19/2016  . Increased BMI 09/19/2016  . Cyst of right ovary 09/19/2016  . Abnormal uterine bleeding (AUB) 09/19/2016  . Frequency of urination 09/19/2016  . Status post LEEP (loop electrosurgical excision procedure) of cervix 09/19/2016    Past Surgical History:  Procedure Laterality Date  . LEEP    . LEEP      Prior to Admission medications   Medication Sig Start Date End Date Taking? Authorizing Provider  ALPRAZolam Duanne Moron) 1 MG tablet Take 1 mg by mouth 3 (three) times daily as needed for anxiety.    [provider]    amphetamine-dextroamphetamine (ADDERALL) 20 MG tablet Take 40 mg daily by mouth.    [provider]  cetirizine (ZYRTEC) 10 MG tablet Take 10 mg by mouth daily.    [provider]  clindamycin (CLEOCIN) 300 MG capsule Take 1 capsule (300 mg total) by mouth 3 (three) times daily. Patient not taking: Reported on 02/15/2018 02/04/17   Norval Gable, MD  fluticasone [facility]) 50 MCG/ACT nasal spray Place 2 sprays into both nostrils daily. 07/04/17   Melynda Ripple, MD  ibuprofen (ADVIL,MOTRIN) 600 MG tablet Take 1 tablet (600 mg total) by mouth every 6 (six) hours as needed. Patient not taking: Reported on 02/15/2018 07/04/17   Melynda Ripple, MD  magic mouthwash w/lidocaine SOLN Take 10 mLs by mouth 3 (three) times daily as needed for mouth pain. Swish and spit do not swallow Patient not taking: Reported on 02/15/2018 07/04/17   Melynda Ripple, MD  Norgestimate-Ethinyl Estradiol Triphasic (TRI-SPRINTEC) 0.18/0.215/0.25 MG-35 MCG tablet Take 1 tablet by mouth daily.    [provider]  tranexamic acid (LYSTEDA) 650 MG TABS tablet Take 2 tablets (1,300 mg total) by mouth 3 (three) times daily for 5 days. 03/06/18 03/11/18  Darel Hong, MD    Allergies Bactrim [sulfamethoxazole-trimethoprim]; Gadolinium derivatives; Iodinated diagnostic agents; Other; Sulfa antibiotics; Azithromycin; Clarithromycin; Clindamycin; Furosemide; Guaifenesin; Macrobid [nitrofurantoin monohyd macro]; Nitrofurantoin; Penicillins; Amoxicillin; Doxycycline; Keflex [cephalexin]; and Levofloxacin  Family History  Problem Relation Age of Onset  . Cancer Mother   . Cancer Father  Social History Social History   Tobacco Use  . Smoking status: Never Smoker  . Smokeless tobacco: Never Used  Substance Use Topics  . Alcohol use: No  . Drug use: No    Review of Systems Constitutional: No fever/chills Eyes: No visual changes. ENT: No sore throat. Cardiovascular: Denies chest  pain. Respiratory: Denies shortness of breath. Gastrointestinal: Positive for abdominal pain.  Positive for nausea, no vomiting.  No diarrhea.  No constipation. Genitourinary: Negative for dysuria. Musculoskeletal: Negative for back pain. Skin: Negative for rash. Neurological: Negative for headaches, focal weakness or numbness.   ____________________________________________   PHYSICAL EXAM:  VITAL SIGNS: ED Triage Vitals  Enc Vitals Group     BP 03/05/18 2305 (!) 145/70     Pulse Rate 03/05/18 2305 89     Resp 03/05/18 2305 20     Temp 03/05/18 2305 98.7 F (37.1 C)     Temp Source 03/05/18 2305 Oral     SpO2 03/05/18 2305 98 %     Weight 03/05/18 2306 200 lb (90.7 kg)     Height 03/05/18 2306 5\' 6"  (1.676 m)     Head Circumference --      Peak Flow --      Pain Score 03/05/18 2306 8     Pain Loc --      Pain Edu? --      Excl. in [place]? --     Constitutional: Alert oriented x4 obviously uncomfortable Eyes: PERRL EOMI. Head: Atraumatic. Nose: No congestion/rhinnorhea. Mouth/Throat: No trismus Neck: No stridor.   Cardiovascular: Normal rate, regular rhythm. Grossly normal heart sounds.  Good peripheral circulation. Respiratory: Normal respiratory effort.  No retractions. Lungs CTAB and moving good air Gastrointestinal: Soft mild diffuse tenderness worse on the right no peritonitis Musculoskeletal: No lower extremity edema   Neurologic:  Normal speech and language. No gross focal neurologic deficits are appreciated. Skin:  Skin is warm, dry and intact. No rash noted. Psychiatric: Mood and affect are normal. Speech and behavior are normal.    ____________________________________________   DIFFERENTIAL includes but not limited to  Ovarian torsion, ovarian cyst, fibroids, menometrorrhagia ____________________________________________   LABS (all labs ordered are listed, but only abnormal results are displayed)  Labs Reviewed  URINALYSIS, COMPLETE (UACMP) WITH  MICROSCOPIC - Abnormal; Notable for the following components:      Result Value   Color, Urine STRAW (*)    APPearance CLEAR (*)    Hgb urine dipstick MODERATE (*)    Bacteria, UA RARE (*)    All other components within normal limits  POCT PREGNANCY, URINE    Urinalysis reviewed by me shows the patient is not pregnant __________________________________________  EKG   ____________________________________________  RADIOLOGY  Pelvic ultrasound reviewed by me shows right-sided ovarian cyst and leiomyomas although no evidence of torsion ____________________________________________   PROCEDURES  Procedure(s) performed: no  Procedures  Critical Care performed: no  ____________________________________________   INITIAL IMPRESSION / ASSESSMENT AND PLAN / ED COURSE  Pertinent labs & imaging results that were available during my care of the patient were reviewed by me and considered in my medical decision making (see chart for details).   As part of my medical decision making, I reviewed the following data within the [place] History obtained from family if available, nursing notes, old chart and ekg, as well as notes from prior ED visits.  Patient comes to the emergency department with acute on chronic pelvic pain and new vaginal bleeding.  Pelvic  ultrasound is reassuring.  I have encouraged the patient keep follow-up with her OB gynecologist I will prescribe her tranexamic acid to help with the bleeding.  Discharged home in improved condition.      ____________________________________________   FINAL CLINICAL IMPRESSION(S) / ED DIAGNOSES  Final diagnoses:  Dysfunctional uterine bleeding  Cyst of ovary, unspecified laterality      NEW MEDICATIONS STARTED DURING THIS VISIT:  Discharge Medication List as of 03/06/2018  2:43 AM    START taking these medications   Details  tranexamic acid (LYSTEDA) 650 MG TABS tablet Take 2 tablets (1,300 mg total)  by mouth 3 (three) times daily for 5 days., Starting Sat 03/06/2018, Until Thu 03/11/2018, Print         Note:  This document was prepared using Dragon voice recognition software and may include unintentional dictation errors.     Darel Hong, MD 03/07/18 [phone]

## 2018-03-06 NOTE — ED Notes (Signed)
MD Rifenbark at bedside.

## 2018-03-16 ENCOUNTER — Other Ambulatory Visit (HOSPITAL_COMMUNITY)
Admission: RE | Admit: 2018-03-16 | Discharge: 2018-03-16 | Disposition: A | Discharge status: Home / Self Care | Payer: Medicaid Other | Source: Ambulatory Visit | Attending: Obstetrics and Gynecology | Admitting: Obstetrics and Gynecology

## 2018-03-16 ENCOUNTER — Encounter: Payer: Self-pay | Admitting: Obstetrics and Gynecology

## 2018-03-16 ENCOUNTER — Telehealth: Payer: Self-pay | Admitting: Obstetrics and Gynecology

## 2018-03-16 ENCOUNTER — Ambulatory Visit: Payer: Medicaid Other | Admitting: Obstetrics and Gynecology

## 2018-03-16 VITALS — BP 133/76 | HR 90 | Ht 66.0 in | Wt 193.3 lb

## 2018-03-16 DIAGNOSIS — R399 Unspecified symptoms and signs involving the genitourinary system: Secondary | ICD-10-CM

## 2018-03-16 DIAGNOSIS — R102 Pelvic and perineal pain: Secondary | ICD-10-CM | POA: Diagnosis not present

## 2018-03-16 DIAGNOSIS — N83201 Unspecified ovarian cyst, right side: Secondary | ICD-10-CM

## 2018-03-16 DIAGNOSIS — D259 Leiomyoma of uterus, unspecified: Secondary | ICD-10-CM

## 2018-03-16 DIAGNOSIS — N951 Menopausal and female climacteric states: Secondary | ICD-10-CM | POA: Diagnosis not present

## 2018-03-16 DIAGNOSIS — Z1231 Encounter for screening mammogram for malignant neoplasm of breast: Secondary | ICD-10-CM | POA: Diagnosis not present

## 2018-03-16 DIAGNOSIS — Z1239 Encounter for other screening for malignant neoplasm of breast: Secondary | ICD-10-CM

## 2018-03-16 DIAGNOSIS — Z124 Encounter for screening for malignant neoplasm of cervix: Secondary | ICD-10-CM | POA: Insufficient documentation

## 2018-03-16 LAB — POCT URINALYSIS DIPSTICK
Bilirubin, UA: NEGATIVE
Glucose, UA: NEGATIVE
Ketones, UA: NEGATIVE
LEUKOCYTES UA: NEGATIVE
NITRITE UA: NEGATIVE
PROTEIN UA: NEGATIVE
Spec Grav, UA: <=1.005 — AB (ref 1.010–1.025)
Urobilinogen, UA: 0.2 E.U./dL
pH, UA: 6.5 (ref 5.0–8.0)

## 2018-03-16 NOTE — Patient Instructions (Signed)
Perimenopause Perimenopause is the time when your body begins to move into the menopause (no menstrual period for 12 straight months). It is a natural process. Perimenopause can begin 2-8 years before the menopause and usually lasts for 1 year after the menopause. During this time, your ovaries may or may not produce an egg. The ovaries vary in their production of estrogen and progesterone hormones each month. This can cause irregular menstrual periods, difficulty getting pregnant, vaginal bleeding between periods, and uncomfortable symptoms. What are the causes?  Irregular production of the ovarian hormones, estrogen and progesterone, and not ovulating every month. Other causes include:  Tumor of the pituitary gland in the brain.  Medical disease that affects the ovaries.  Radiation treatment.  Chemotherapy.  Unknown causes.  Heavy smoking and excessive alcohol intake can bring on perimenopause sooner. What are the signs or symptoms?  Hot flashes.  Night sweats.  Irregular menstrual periods.  Decreased sex drive.  Vaginal dryness.  Headaches.  Mood swings.  Depression.  Memory problems.  Irritability.  Tiredness.  Weight gain.  Trouble getting pregnant.  The beginning of losing bone cells (osteoporosis).  The beginning of hardening of the arteries (atherosclerosis). How is this diagnosed? Your health care provider will make a diagnosis by analyzing your age, menstrual history, and symptoms. He or she will do a physical exam and note any changes in your body, especially your female organs. Female hormone tests may or may not be helpful depending on the amount of female hormones you produce and when you produce them. However, other hormone tests may be helpful to rule out other problems. How is this treated? In some cases, no treatment is needed. The decision on whether treatment is necessary during the perimenopause should be made by you and your health care  provider based on how the symptoms are affecting you and your lifestyle. Various treatments are available, such as:  Treating individual symptoms with a specific medicine for that symptom.  Herbal medicines that can help specific symptoms.  Counseling.  Group therapy. Follow these instructions at home:  Keep track of your menstrual periods (when they occur, how heavy they are, how long between periods, and how long they last) as well as your symptoms and when they started.  Only take over-the-counter or prescription medicines as directed by your health care provider.  Sleep and rest.  Exercise.  Eat a diet that contains calcium (good for your bones) and soy (acts like the estrogen hormone).  Do not smoke.  Avoid alcoholic beverages.  Take vitamin supplements as recommended by your health care provider. Taking vitamin E may help in certain cases.  Take calcium and vitamin D supplements to help prevent bone loss.  Group therapy is sometimes helpful.  Acupuncture may help in some cases. Contact a health care provider if:  You have questions about any symptoms you are having.  You need a referral to a specialist (gynecologist, psychiatrist, or psychologist). Get help right away if:  You have vaginal bleeding.  Your period lasts longer than 8 days.  Your periods are recurring sooner than 21 days.  You have bleeding after intercourse.  You have severe depression.  You have pain when you urinate.  You have severe headaches.  You have vision problems. This information is not intended to replace advice given to you by your health care provider. Make sure you discuss any questions you have with your health care provider. Document Released: 08/21/2004 Document Revised: 12/20/2015 Document Reviewed: 02/10/2013 Elsevier Interactive   Patient Education  2017 Elsevier Inc.  

## 2018-03-16 NOTE — Progress Notes (Signed)
GYNECOLOGY PROGRESS NOTE  Subjective:    Patient ID: Shirley [name], female    DOB: 1971-05-28, 47 y.o.   MRN: [id number]  HPI  Patient is a 47 y.o. [id number] female who presents for consultation for management of fibroids.  She was previously seen by Dani Gobble, CNM.  In addition, she has complaints of mood swings, weight gain, hair loss, fatigue persistent dysuria/frequency of urination, and right ovarian cyst.  She is following up also after having an ultrasound to discuss those results.   Of note, patient has recently been seen by [facility] (in the Emergency Room) with the above complaints as well as pelvic pain in July.  She states that she was told that she needed a hysterectomy as soon as possible, although patient states that she is afraid of having a major surgery, and is curious about other options. She is currently on combined OCPs which she notes was helping her in the past (has been having symptoms for ~ 1 year), however is no longer working.    Lastly, patient notes that she needs a pap smear.  States that it has been at least 4 years and has a h/o having abnormal pap smears requiring a LEEP procedure. Previously saw Dr. Enzo Bi on 09/19/2016 for perimenopausal symptoms, but never returned for annual exam. Is also due for a mammogram.    The following portions of the patient's history were reviewed and updated as appropriate:  She  has a past medical history of Abnormal Pap smear of cervix, ADHD (attention deficit hyperactivity disorder), Anxiety, Mitral valve prolapse, Spina bifida occulta, and UTI (urinary tract infection).   She  has a past surgical history that includes LEEP.  Her family history includes Cancer in her father and mother.   She  reports that she has never smoked. She has never used smokeless tobacco. She reports that she does not drink alcohol or use drugs.   Current Outpatient Medications on File Prior to Visit  Medication Sig Dispense Refill  .  albuterol (PROVENTIL HFA;VENTOLIN HFA) 108 (90 Base) MCG/ACT inhaler Inhale into the lungs every 6 (six) hours as needed for wheezing or shortness of breath.    . ALPRAZolam (XANAX) 1 MG tablet Take 1 mg by mouth 3 (three) times daily as needed for anxiety.    . cetirizine (ZYRTEC) 10 MG tablet Take 10 mg by mouth daily.    . fluticasone (FLONASE) 50 MCG/ACT nasal spray Place 2 sprays into both nostrils daily. 16 g 0  . fluticasone (FLOVENT HFA) 220 MCG/ACT inhaler Inhale into the lungs 2 (two) times daily.    . fosfomycin (MONUROL) 3 g PACK Take 3 g by mouth once.    Marland Kitchen lisinopril (PRINIVIL,ZESTRIL) 10 MG tablet Take 10 mg by mouth daily.    . Norgestimate-Ethinyl Estradiol Triphasic (TRI-SPRINTEC) 0.18/0.215/0.25 MG-35 MCG tablet Take 1 tablet by mouth daily.    . traZODone (DESYREL) 100 MG tablet Take 100 mg by mouth at bedtime.     No current facility-administered medications on file prior to visit.    She is allergic to bactrim [sulfamethoxazole-trimethoprim]; gadolinium derivatives; iodinated diagnostic agents; other; sulfa antibiotics; azithromycin; clarithromycin; clindamycin; furosemide; guaifenesin; macrobid [nitrofurantoin monohyd macro]; nitrofurantoin; penicillins; amoxicillin; doxycycline; keflex [cephalexin]; and levofloxacin..  Review of Systems Pertinent items noted in HPI and remainder of comprehensive ROS otherwise negative.   Objective:   Blood pressure 133/76, pulse 90, height 5\' 6"  (1.676 m), weight 193 lb 4.8 oz (87.7 kg), last menstrual period 02/19/2018. General  appearance: alert and no distress Abdomen: soft, non-tender; bowel sounds normal; no masses,  no organomegaly Pelvic: external genitalia normal, rectovaginal septum normal.  Vagina without discharge.  Cervix normal appearing, no lesions and no motion tenderness.  Uterus mobile, nontender, normal shape and size.  Adnexae non-palpable, nontender bilaterally.  Extremities: extremities normal, atraumatic, no cyanosis  or edema Neurologic: Grossly normal    Labs:  Results for orders placed or performed in visit on 03/16/18  POCT urinalysis dipstick  Result Value Ref Range   Color, UA yellow    Clarity, UA clear    Glucose, UA Negative Negative   Bilirubin, UA neg    Ketones, UA neg    Spec Grav, UA <=1.005 (A) 1.010 - 1.025   Blood, UA 2+    pH, UA 6.5 5.0 - 8.0   Protein, UA Negative Negative   Urobilinogen, UA 0.2 0.2 or 1.0 E.U./dL   Nitrite, UA neg    Leukocytes, UA Negative Negative   Appearance yellow    Odor      Imaging:  [place] Art/Ven Flow Abd Pelv Doppler CLINICAL DATA:  Vaginal bleeding with known right ovarian cyst. Pelvic pain. History of LEEP procedure.  EXAM: TRANSABDOMINAL AND TRANSVAGINAL ULTRASOUND OF PELVIS  DOPPLER ULTRASOUND OF OVARIES  TECHNIQUE: Both transabdominal and transvaginal ultrasound examinations of the pelvis were performed. Transabdominal technique was performed for global imaging of the pelvis including uterus, ovaries, adnexal regions, and pelvic cul-de-sac.  It was necessary to proceed with endovaginal exam following the transabdominal exam to visualize the uterus, endometrium and ovaries. Color and duplex Doppler ultrasound was utilized to evaluate blood flow to the ovaries.  COMPARISON:  06/04/2017 ultrasound  FINDINGS: Uterus  Measurements: 9 x 5.1 x 4.9 cm . Retroverted uterus with subserosal 2.2 x 1.9 x 2.2 cm hypoechoic uterine leiomyoma and a 1.7 x 1.7 x 1.8 cm anatomically anterior intramural uterine body fibroid.  Endometrium  Thickness: 4.6 mm and homogeneous. No focal abnormality visualized.  Right ovary  Measurements: 5.3 x 3.7 x 4.9 cm. Simple appearing anechoic cyst measuring 4.8 x 4.5 x 4.6 cm slightly increased in size from the 4.3 cm size on the study from 06/04/2017.  Left ovary  Measurements: 2.3 x 1.3 x 1.2 cm. Normal appearance/no adnexal mass.  Pulsed Doppler evaluation of both ovaries demonstrates  normal low-resistance arterial and venous waveforms.  Other findings  No abnormal free fluid.  IMPRESSION: 1. Retroverted uterus with intramural and subserosal anatomically anterior leiomyomas as above. 2. Simple right ovarian cyst measuring 4.8 x 4.5 x 4.6 cm almost certainly to be benign without complicating nor concerning features.  Electronically Signed   By: Ashley Royalty M.D.   On: 03/06/2018 01:58 US Transvaginal Non-OB CLINICAL DATA:  Vaginal bleeding with known right ovarian cyst. Pelvic pain. History of LEEP procedure.  EXAM: TRANSABDOMINAL AND TRANSVAGINAL ULTRASOUND OF PELVIS  DOPPLER ULTRASOUND OF OVARIES  TECHNIQUE: Both transabdominal and transvaginal ultrasound examinations of the pelvis were performed. Transabdominal technique was performed for global imaging of the pelvis including uterus, ovaries, adnexal regions, and pelvic cul-de-sac.  It was necessary to proceed with endovaginal exam following the transabdominal exam to visualize the uterus, endometrium and ovaries. Color and duplex Doppler ultrasound was utilized to evaluate blood flow to the ovaries.  COMPARISON:  06/04/2017 ultrasound  FINDINGS: Uterus  Measurements: 9 x 5.1 x 4.9 cm . Retroverted uterus with subserosal 2.2 x 1.9 x 2.2 cm hypoechoic uterine leiomyoma and a 1.7 x 1.7 x 1.8 cm anatomically  anterior intramural uterine body fibroid.  Endometrium  Thickness: 4.6 mm and homogeneous. No focal abnormality visualized.  Right ovary  Measurements: 5.3 x 3.7 x 4.9 cm. Simple appearing anechoic cyst measuring 4.8 x 4.5 x 4.6 cm slightly increased in size from the 4.3 cm size on the study from 06/04/2017.  Left ovary  Measurements: 2.3 x 1.3 x 1.2 cm. Normal appearance/no adnexal mass.  Pulsed Doppler evaluation of both ovaries demonstrates normal low-resistance arterial and venous waveforms.  Other findings  No abnormal free fluid.  IMPRESSION: 1. Retroverted uterus with  intramural and subserosal anatomically anterior leiomyomas as above. 2. Simple right ovarian cyst measuring 4.8 x 4.5 x 4.6 cm almost certainly to be benign without complicating nor concerning features.  Electronically Signed   By: Ashley Royalty M.D.   On: 03/06/2018 01:58 US Pelvis Complete CLINICAL DATA:  Vaginal bleeding with known right ovarian cyst. Pelvic pain. History of LEEP procedure.  EXAM: TRANSABDOMINAL AND TRANSVAGINAL ULTRASOUND OF PELVIS  DOPPLER ULTRASOUND OF OVARIES  TECHNIQUE: Both transabdominal and transvaginal ultrasound examinations of the pelvis were performed. Transabdominal technique was performed for global imaging of the pelvis including uterus, ovaries, adnexal regions, and pelvic cul-de-sac.  It was necessary to proceed with endovaginal exam following the transabdominal exam to visualize the uterus, endometrium and ovaries. Color and duplex Doppler ultrasound was utilized to evaluate blood flow to the ovaries.  COMPARISON:  06/04/2017 ultrasound  FINDINGS: Uterus  Measurements: 9 x 5.1 x 4.9 cm . Retroverted uterus with subserosal 2.2 x 1.9 x 2.2 cm hypoechoic uterine leiomyoma and a 1.7 x 1.7 x 1.8 cm anatomically anterior intramural uterine body fibroid.  Endometrium  Thickness: 4.6 mm and homogeneous. No focal abnormality visualized.  Right ovary  Measurements: 5.3 x 3.7 x 4.9 cm. Simple appearing anechoic cyst measuring 4.8 x 4.5 x 4.6 cm slightly increased in size from the 4.3 cm size on the study from 06/04/2017.  Left ovary  Measurements: 2.3 x 1.3 x 1.2 cm. Normal appearance/no adnexal mass.  Pulsed Doppler evaluation of both ovaries demonstrates normal low-resistance arterial and venous waveforms.  Other findings  No abnormal free fluid.  IMPRESSION: 1. Retroverted uterus with intramural and subserosal anatomically anterior leiomyomas as above. 2. Simple right ovarian cyst measuring 4.8 x 4.5 x 4.6 cm almost certainly  to be benign without complicating nor concerning features.  Electronically Signed   By: Ashley Royalty M.D.   On: 03/06/2018 01:58   Assessment:   Perimenopausal symptoms Pelvic pain Fibroid uterus Ovarian cyst (right) UTI symptoms Cervical cancer screening Breast cancer screening  Plan:   1. Perimenopausal symptoms - Discussion had with patient regarding symptoms. Currently on combined OCPs. Discussed that this may not be the best regimen for her based on her age. Advised on options including HRT/bioidentical hormones, Mirena IUD.  Discussed discontinuing her OCPs to allow for assessment of baseline labs.  Patient due to finish pills at the end of this week. To f/u next week to have blood levels drawn. Once labs drawn, will contact patient to discuss next steps.  2. Pelvic pain - has not had any further pain since ER visit in July.   3. Fibroid uterus - discussed with patient that based on the size of her fibroids, she did not necessarily require a hysterectomy. If she were having symptoms of persistent abnormal bleeding or pain, then it could be considered.  Patient notes she would like to hold off if possible.  4. Ovarian cyst  - has  slightly increased in size since ultrasound performed in [facility]. Dose appear to be a benign simple cyst.  Discussed that surgical intervention is required if cyst grows to over 6 cm, becomes complex, and/or is causing pelvic pain. Discussed option of increasing dose of birth control for 2 months to treat cyst, and then discontinue. Patient notes she would like to think about this a little more.  5. Dysuria - patient notes chronic dysuria. On further investigation, patient notes a h/o hematuria in the past, was seen by a Urologist but never followed up after initial visit. Today's UA with blood as well, no nitrates.  Recommend that patient f/u with Urology as this has been ongoing for some time now.  6. Pap smear performed for cervical cancer screening  as patient has been lost to f/u in the past.  7. Mammogram ordered for breast cancer screening.   Follow up in 1 week for labs, will notify patient of results and discuss next steps.    A total of 25 minutes were spent face-to-face with the patient during the encounter with greater than 50% dealing with counseling and coordination of care.   Rubie Maid, MD [facility]

## 2018-03-16 NOTE — Telephone Encounter (Signed)
The patient called and stated that she was seen in the office today and that she would like to know if she has a UTI. Please advise.

## 2018-03-16 NOTE — Progress Notes (Signed)
Pt stated that is here to discuss fibroid management options.

## 2018-03-17 NOTE — Telephone Encounter (Signed)
Pt was informed that she did not have an UTI.

## 2018-03-18 ENCOUNTER — Telehealth: Payer: Self-pay | Admitting: Obstetrics and Gynecology

## 2018-03-18 LAB — CYTOLOGY - PAP
Diagnosis: NEGATIVE
HPV (WINDOPATH): NOT DETECTED

## 2018-03-18 NOTE — Telephone Encounter (Signed)
The patient is having a brownish-orange discharge now when she wipes, and UTI symptoms.  She is asking if she can do a urine drop off?  She is having kidney symptoms and dysuria and menstrual type cramps.  She is asking the nurse/provider call her please, thank you

## 2018-03-18 NOTE — Telephone Encounter (Signed)
Pt called no answer LM via voicemail that she could drop off some urine to be tested.

## 2018-03-19 ENCOUNTER — Other Ambulatory Visit: Payer: Medicaid Other

## 2018-03-22 ENCOUNTER — Telehealth: Payer: Self-pay | Admitting: Obstetrics and Gynecology

## 2018-03-22 NOTE — Telephone Encounter (Signed)
Patient called requesting results from urine drop off on Friday. She would like a call back. Thanks

## 2018-03-22 NOTE — Telephone Encounter (Signed)
The patient called and stated that she wold like to have a call back from her nurse to explain why she needs to drop off another urine sample. Please advise.

## 2018-03-22 NOTE — Telephone Encounter (Signed)
Pt was called and asked if she could drop off another sample of urine to be retested to make sure everything is negative.

## 2018-03-24 NOTE — Telephone Encounter (Signed)
Pt was called and stated that she had an appointment with her pcp and she had her urine tested there and her pcp stated that she had an uti. Pt is currently taking antibiotic for her UTI.

## 2018-03-31 DIAGNOSIS — I10 Essential (primary) hypertension: Secondary | ICD-10-CM | POA: Diagnosis not present

## 2018-03-31 DIAGNOSIS — F900 Attention-deficit hyperactivity disorder, predominantly inattentive type: Secondary | ICD-10-CM | POA: Diagnosis not present

## 2018-03-31 DIAGNOSIS — E559 Vitamin D deficiency, unspecified: Secondary | ICD-10-CM | POA: Diagnosis not present

## 2018-03-31 DIAGNOSIS — M545 Low back pain: Secondary | ICD-10-CM | POA: Diagnosis not present

## 2018-03-31 DIAGNOSIS — J3089 Other allergic rhinitis: Secondary | ICD-10-CM | POA: Diagnosis not present

## 2018-04-01 ENCOUNTER — Other Ambulatory Visit: Payer: Medicaid Other

## 2018-04-07 DIAGNOSIS — M545 Low back pain: Secondary | ICD-10-CM | POA: Diagnosis not present

## 2018-04-07 DIAGNOSIS — N390 Urinary tract infection, site not specified: Secondary | ICD-10-CM | POA: Diagnosis not present

## 2018-04-07 DIAGNOSIS — L309 Dermatitis, unspecified: Secondary | ICD-10-CM | POA: Diagnosis not present

## 2018-04-07 DIAGNOSIS — F419 Anxiety disorder, unspecified: Secondary | ICD-10-CM | POA: Diagnosis not present

## 2018-04-07 DIAGNOSIS — F900 Attention-deficit hyperactivity disorder, predominantly inattentive type: Secondary | ICD-10-CM | POA: Diagnosis not present

## 2018-04-07 DIAGNOSIS — E559 Vitamin D deficiency, unspecified: Secondary | ICD-10-CM | POA: Diagnosis not present

## 2018-04-08 ENCOUNTER — Other Ambulatory Visit: Payer: Self-pay

## 2018-04-08 ENCOUNTER — Emergency Department
Admission: EM | Admit: 2018-04-08 | Discharge: 2018-04-08 | Disposition: A | Discharge status: Home / Self Care | Payer: Medicaid Other | Attending: Emergency Medicine | Admitting: Emergency Medicine

## 2018-04-08 ENCOUNTER — Encounter: Payer: Self-pay | Admitting: *Deleted

## 2018-04-08 DIAGNOSIS — Z79899 Other long term (current) drug therapy: Secondary | ICD-10-CM | POA: Insufficient documentation

## 2018-04-08 DIAGNOSIS — L089 Local infection of the skin and subcutaneous tissue, unspecified: Secondary | ICD-10-CM | POA: Insufficient documentation

## 2018-04-08 DIAGNOSIS — L03313 Cellulitis of chest wall: Secondary | ICD-10-CM

## 2018-04-08 DIAGNOSIS — R21 Rash and other nonspecific skin eruption: Secondary | ICD-10-CM | POA: Diagnosis present

## 2018-04-08 LAB — BASIC METABOLIC PANEL
ANION GAP: 8 (ref 5–15)
BUN: 9 mg/dL (ref 6–20)
CALCIUM: 9.3 mg/dL (ref 8.9–10.3)
CO2: 26 mmol/L (ref 22–32)
CREATININE: 0.71 mg/dL (ref 0.44–1.00)
Chloride: 105 mmol/L (ref 98–111)
GFR calc Af Amer: >60 mL/min (ref >60)
GLUCOSE: 144 mg/dL — AB (ref 70–99)
Potassium: 3.4 mmol/L — ABNORMAL LOW (ref 3.5–5.1)
SODIUM: 139 mmol/L (ref 135–145)

## 2018-04-08 LAB — CBC
HCT: 37.6 % (ref 35.0–47.0)
Hemoglobin: 13 g/dL (ref 12.0–16.0)
MCH: 29.9 pg (ref 26.0–34.0)
MCHC: 34.6 g/dL (ref 32.0–36.0)
MCV: 86.3 fL (ref 80.0–100.0)
PLATELETS: 276 10*3/uL (ref 150–440)
RBC: 4.35 MIL/uL (ref 3.80–5.20)
RDW: 13.3 % (ref 11.5–14.5)
WBC: 7.4 10*3/uL (ref 3.6–11.0)

## 2018-04-08 LAB — URINALYSIS, COMPLETE (UACMP) WITH MICROSCOPIC
BACTERIA UA: NONE SEEN
Bilirubin Urine: NEGATIVE
GLUCOSE, UA: NEGATIVE mg/dL
Ketones, ur: NEGATIVE mg/dL
Leukocytes, UA: NEGATIVE
NITRITE: NEGATIVE
PH: 6 (ref 5.0–8.0)
Protein, ur: NEGATIVE mg/dL
SPECIFIC GRAVITY, URINE: 1.002 — AB (ref 1.005–1.030)
WBC, UA: NONE SEEN WBC/hpf (ref 0–5)

## 2018-04-08 LAB — POCT PREGNANCY, URINE: PREG TEST UR: NEGATIVE

## 2018-04-08 MED ORDER — CLINDAMYCIN HCL 150 MG PO CAPS: 300.0000 mg | ORAL_CAPSULE | Freq: Three times a day (TID) | ORAL | 0 refills | Status: AC

## 2018-04-08 MED ORDER — CLINDAMYCIN HCL 150 MG PO CAPS
300.0000 mg | ORAL_CAPSULE | ORAL | Status: AC
  Administered 2018-04-08: 300 mg via ORAL
  Filled 2018-04-08 (×2): qty 2

## 2018-04-08 MED ORDER — CLINDAMYCIN HCL 300 MG PO CAPS: 300.0000 mg | ORAL_CAPSULE | Freq: Three times a day (TID) | ORAL | 0 refills | Status: DC

## 2018-04-08 NOTE — ED Triage Notes (Addendum)
Pt to ED reporting multiple medical complaints. Pt has a rash noted to the center of her chest that has been worsening for the past 2-3 days. Rash has small sores noted that are weeping slightly and severely tender upon palpation. Pt reports fear that she was stung by an insect and verbalized feeling as though it could be a brown Reclus. But pt denies having seen a spider at the time of the sting.Pt is afebrile today but reports a fever last night that decreased after she took tylenol. Pt reporting diarrhea, vomiting and weakness with dizziness upon movement x 2 days. Pt also verbalized feeling SOB but is having no noted increased WOB.   Pt also stating she is now starting to have a headache. No neuro deficits noted.

## 2018-04-08 NOTE — ED Provider Notes (Addendum)
[facility] Emergency Department Provider Note   ____________________________________________   First MD Initiated Contact with Patient 04/08/18 1652     (approximate)  I have reviewed the triage vital signs and the nursing notes.   HISTORY  Chief Complaint Rash; Weakness; and Diarrhea   HPI Shirley [name] is a 47 y.o. female history of frequent UTIs, anxiety  Patient reports about 2-1/2 to 3 days ago she was out with her son and she felt something bite or sting her in the center of her chest above the breastbone.  The next day she started noticed little small areas of redness and pimples appearing, the seem to be getting a little bit worse and have been draining fluid at times.  There is an itching burning component to the discomfort.  She has not noticed any other rash along the sides or along the back.  She does not have any history of shingles, saw her primary doctor yesterday and he recommended treatment with a steroid cream  She is been feeling anxious about it, she is concerned as her son was showing her pictures of brown recluse bites though she never saw a spider bite her.  No fevers or chills.  No chest pain.  Reports she felt little short of breath thinking about the possibility of being bit by spider earlier today, but is not having any trouble breathing and denies any trouble breathing.  Past Medical History:  Diagnosis Date  . Abnormal Pap smear of cervix   . ADHD (attention deficit hyperactivity disorder)   . Anxiety   . Mitral valve prolapse   . Spina bifida occulta   . UTI (urinary tract infection)     Patient Active Problem List   Diagnosis Date Noted  . Perimenopause 09/19/2016  . Family history of ovarian cancer 09/19/2016  . Family history of colon cancer 09/19/2016  . Anxiety 09/19/2016  . Increased BMI 09/19/2016  . Cyst of right ovary 09/19/2016  . Abnormal uterine bleeding (AUB) 09/19/2016  . Frequency of urination  09/19/2016  . Status post LEEP (loop electrosurgical excision procedure) of cervix 09/19/2016    Past Surgical History:  Procedure Laterality Date  . LEEP    . LEEP      Prior to Admission medications   Medication Sig Start Date End Date Taking? Authorizing Provider  albuterol (PROVENTIL HFA;VENTOLIN HFA) 108 (90 Base) MCG/ACT inhaler Inhale into the lungs every 6 (six) hours as needed for wheezing or shortness of breath.    [provider]  ALPRAZolam Duanne Moron) 1 MG tablet Take 1 mg by mouth 3 (three) times daily as needed for anxiety.    [provider]  cetirizine (ZYRTEC) 10 MG tablet Take 10 mg by mouth daily.    [provider]  clindamycin (CLEOCIN) 300 MG capsule Take 1 capsule (300 mg total) by mouth 3 (three) times daily. 04/08/18   Delman Kitten, MD  fluticasone (FLONASE) 50 MCG/ACT nasal spray Place 2 sprays into both nostrils daily. 07/04/17   Melynda Ripple, MD  fluticasone (FLOVENT HFA) 220 MCG/ACT inhaler Inhale into the lungs 2 (two) times daily.    [provider]  fosfomycin (MONUROL) 3 g PACK Take 3 g by mouth once.    [provider]  lisinopril (PRINIVIL,ZESTRIL) 10 MG tablet Take 10 mg by mouth daily.    [provider]  Norgestimate-Ethinyl Estradiol Triphasic (TRI-SPRINTEC) 0.18/0.215/0.25 MG-35 MCG tablet Take 1 tablet by mouth daily.    [provider]  traZODone (DESYREL) 100 MG tablet Take 100 mg by mouth at bedtime.    [provider]    Allergies Bactrim [sulfamethoxazole-trimethoprim]; Gadolinium derivatives; Iodinated diagnostic agents; Other; Sulfa antibiotics; Azithromycin; Clarithromycin; Clindamycin; Furosemide; Guaifenesin; Macrobid [nitrofurantoin monohyd macro]; Nitrofurantoin; Penicillins; Amoxicillin; Doxycycline; Keflex [cephalexin]; and Levofloxacin reviewed allergies in detail with the patient, she reports that she is not at all allergic to clindamycin.  She is used clindamycin  several times and is 1 of the few antibiotics she can take, but she has to take with a probiotic.  No history of C. difficile.  Family History  Problem Relation Age of Onset  . Cancer Mother   . Cancer Father     Social History Social History   Tobacco Use  . Smoking status: Never Smoker  . Smokeless tobacco: Never Used  Substance Use Topics  . Alcohol use: No  . Drug use: No    Review of Systems Constitutional: No fever/chills Eyes: No visual changes. ENT: No sore throat. Cardiovascular: Denies chest pain. Respiratory: Denies shortness of breath. Gastrointestinal: No abdominal pain.  No nausea, no vomiting.  No diarrhea.  No constipation. Genitourinary: Negative for dysuria. Musculoskeletal: Negative for back pain. Skin: See HPI Neurological: Negative for headaches, focal weakness or numbness.    ____________________________________________   PHYSICAL EXAM:  VITAL SIGNS: ED Triage Vitals  Enc Vitals Group     BP --      Pulse Rate 04/08/18 1532 93     Resp 04/08/18 1532 16     Temp 04/08/18 1532 98.8 F (37.1 C)     Temp Source 04/08/18 1532 Oral     SpO2 04/08/18 1532 99 %     Weight 04/08/18 1533 180 lb (81.6 kg)     Height 04/08/18 1533 5\' 6"  (1.676 m)     Head Circumference --      Peak Flow --      Pain Score 04/08/18 1532 8     Pain Loc --      Pain Edu? --      Excl. in [place]? --     Constitutional: Alert and oriented. Well appearing and in no acute distress.  He is very pleasant but somewhat anxious. Eyes: Conjunctivae are normal. Head: Atraumatic. Nose: No congestion/rhinnorhea. Mouth/Throat: Mucous membranes are moist. Neck: No stridor.   Cardiovascular: Normal rate, regular rhythm. Grossly normal heart sounds.  Good peripheral circulation. Respiratory: Normal respiratory effort.  No retractions. Lungs CTAB. Gastrointestinal: Soft and nontender. No distention. Musculoskeletal: No lower extremity tenderness nor edema. Neurologic:  Normal  speech and language. No gross focal neurologic deficits are appreciated.  Skin:  Skin is warm, dry and intact. No rash noted except in the center of her breastbone there is a area with multiple small pustules and surrounding redness roughly about 1/2 cm and circumferential in size.  There is no rash or other lesions in any type of dermatomal or radicular pattern involving the remainder of the chest back or abdomen.  No facial rash.Marland Kitchen Psychiatric: Mood and affect are normal. Speech and behavior are normal.  ____________________________________________   LABS (all labs ordered are listed, but only abnormal results are displayed)  Labs Reviewed  BASIC METABOLIC PANEL - Abnormal; Notable for the following components:      Result Value   Potassium 3.4 (*)    Glucose, Bld 144 (*)    All other components within normal limits  URINALYSIS, COMPLETE (UACMP) WITH MICROSCOPIC - Abnormal; Notable for the following components:  Color, Urine COLORLESS (*)    APPearance CLEAR (*)    Specific Gravity, Urine 1.002 (*)    Hgb urine dipstick SMALL (*)    All other components within normal limits  CBC  POCT PREGNANCY, URINE  POC URINE PREG, ED   ____________________________________________  EKG  Reviewed and entered by me at 1530 Heart rate 90 QRS 90 QTc 450 Normal sinus rhythm, no evidence of ischemia. ____________________________________________  RADIOLOGY   ____________________________________________   PROCEDURES  Procedure(s) performed: None  Procedures  Critical Care performed: No  ____________________________________________   INITIAL IMPRESSION / ASSESSMENT AND PLAN / ED COURSE  Pertinent labs & imaging results that were available during my care of the patient were reviewed by me and considered in my medical decision making (see chart for details).  Rash, appears consistent with likely multiple small pustules after which she described as possible insect bite occurring 2  to 3 days ago.  No evidence of eschar necrosis.  Multiple coalescing very small pustules.  Reassuring lab work no signs or symptoms of sepsis or bacteremia.  Discussed with patient we will treat with clindamycin, she reports good treatment with clindamycin in the past, will also take a probiotic and is aware of the potential side effect of C. Difficile.  Does not appear consistent with shingles though this could remain on the differential but is would be unusual not to have some further dermatomal distribution.  Return precautions and treatment recommendations and follow-up discussed with the patient who is agreeable with the plan.       ____________________________________________   FINAL CLINICAL IMPRESSION(S) / ED DIAGNOSES  Final diagnoses:  Pustules determined by examination  Cellulitis of chest wall      NEW MEDICATIONS STARTED DURING THIS VISIT:  New Prescriptions   CLINDAMYCIN (CLEOCIN) 300 MG CAPSULE    Take 1 capsule (300 mg total) by mouth 3 (three) times daily.     Note:  This document was prepared using Dragon voice recognition software and may include unintentional dictation errors.     Delman Kitten, MD 04/08/18 Merlyn Albert    Delman Kitten, MD 04/08/18 [id number]

## 2018-04-08 NOTE — Discharge Instructions (Signed)
°  Please follow up with your doctor Monday for recheck of your infection if you are not improving.  Call your doctor sooner or return to the ED if you develop worsening signs of infection such as: increased redness, increased pain, pus, fever, or other symptoms that concern you.

## 2018-04-12 DIAGNOSIS — R202 Paresthesia of skin: Secondary | ICD-10-CM | POA: Diagnosis not present

## 2018-04-12 DIAGNOSIS — R300 Dysuria: Secondary | ICD-10-CM | POA: Diagnosis not present

## 2018-04-12 DIAGNOSIS — R0789 Other chest pain: Secondary | ICD-10-CM | POA: Diagnosis not present

## 2018-04-12 DIAGNOSIS — Z87891 Personal history of nicotine dependence: Secondary | ICD-10-CM | POA: Diagnosis not present

## 2018-04-12 DIAGNOSIS — R0602 Shortness of breath: Secondary | ICD-10-CM | POA: Diagnosis not present

## 2018-04-12 DIAGNOSIS — L0390 Cellulitis, unspecified: Secondary | ICD-10-CM | POA: Diagnosis not present

## 2018-04-12 DIAGNOSIS — R21 Rash and other nonspecific skin eruption: Secondary | ICD-10-CM | POA: Diagnosis not present

## 2018-04-16 ENCOUNTER — Telehealth: Payer: Self-pay | Admitting: Obstetrics and Gynecology

## 2018-04-16 NOTE — Telephone Encounter (Signed)
The patient called stating she has lost her culture results and needs another copy, she now has shingles and was referred to get a pap? from her OB/GYN and also she would like to speak to her nurse/provider, please advise, thanks.

## 2018-04-19 NOTE — Telephone Encounter (Signed)
Pt was called and the pt wanted her records from [facility]. Pt was advise to come to the office and sign a form to release her records. Pt stated that she understood and would be by this week to complete that form.

## 2018-04-26 ENCOUNTER — Telehealth: Payer: Self-pay | Admitting: Obstetrics and Gynecology

## 2018-04-26 DIAGNOSIS — F900 Attention-deficit hyperactivity disorder, predominantly inattentive type: Secondary | ICD-10-CM | POA: Diagnosis not present

## 2018-04-26 DIAGNOSIS — N390 Urinary tract infection, site not specified: Secondary | ICD-10-CM | POA: Diagnosis not present

## 2018-04-26 DIAGNOSIS — B009 Herpesviral infection, unspecified: Secondary | ICD-10-CM | POA: Diagnosis not present

## 2018-04-26 DIAGNOSIS — M545 Low back pain: Secondary | ICD-10-CM | POA: Diagnosis not present

## 2018-04-26 DIAGNOSIS — F419 Anxiety disorder, unspecified: Secondary | ICD-10-CM | POA: Diagnosis not present

## 2018-04-26 NOTE — Telephone Encounter (Signed)
Pt called no answer LM via voicemail to call the office.  

## 2018-04-26 NOTE — Telephone Encounter (Signed)
The patient called and stated that she would like to have a call back from her nurse if possible. No other information was disclosed. Please advise.

## 2018-05-07 ENCOUNTER — Other Ambulatory Visit: Payer: Self-pay

## 2018-05-07 NOTE — Telephone Encounter (Signed)
Pt called no answer LM via voicemail to call the office to speak with her to inform her of the information that was given by [facility].

## 2018-05-07 NOTE — Telephone Encounter (Signed)
Pt called this morning at 8:02am stating that she did not receive a call back. Pt was informed that a voicemail was left asking her to call the office. Pt stated that she was still having hot flashes, haven't had a cycle in 2 months, bloating and wanted to know if she should stay on her birth control or stay off. Pt was informed as soon as AC arrived that she would speak with her. Pt was fine with that.

## 2018-05-07 NOTE — Telephone Encounter (Signed)
Pt called and was informed that [facility] wanted her to come in to have her blood work done to see if she was going into menopause. Pt stated that she had not come by the office to get her blood work done due to other illness. Pt was advised that as soon as her blood be completed AC would know how to treat her problems.

## 2018-05-17 ENCOUNTER — Telehealth: Payer: Self-pay | Admitting: Obstetrics and Gynecology

## 2018-05-17 NOTE — Telephone Encounter (Signed)
Patient called requesting an appointment for this Wednesday. I explained Dr Marcelline Mates is out of the office this week, she would like to see Dr D if possible. She wants to be seen to discuss birth control. Thanks

## 2018-05-18 NOTE — Telephone Encounter (Signed)
LMTRC

## 2018-05-18 NOTE — Telephone Encounter (Signed)
Pt states she has not had a cycle in 3 months. She d/c ocp 3 months ago. She feels very bloated. Not SA. Pos pain with uti- to f/u with pcp tomorrow. BM- wnl. Advised that per [place] last note she needs to have b/w. Encouraged b/w so [facility] can assess and move forward with treatment. Pt states she will call back and have b/w done next week.

## 2018-05-19 DIAGNOSIS — E559 Vitamin D deficiency, unspecified: Secondary | ICD-10-CM | POA: Diagnosis not present

## 2018-05-19 DIAGNOSIS — E8801 Alpha-1-antitrypsin deficiency: Secondary | ICD-10-CM | POA: Diagnosis not present

## 2018-05-19 DIAGNOSIS — I10 Essential (primary) hypertension: Secondary | ICD-10-CM | POA: Diagnosis not present

## 2018-05-19 DIAGNOSIS — F419 Anxiety disorder, unspecified: Secondary | ICD-10-CM | POA: Diagnosis not present

## 2018-05-19 DIAGNOSIS — F900 Attention-deficit hyperactivity disorder, predominantly inattentive type: Secondary | ICD-10-CM | POA: Diagnosis not present

## 2018-05-25 DIAGNOSIS — F410 Panic disorder [episodic paroxysmal anxiety] without agoraphobia: Secondary | ICD-10-CM | POA: Diagnosis not present

## 2018-05-25 DIAGNOSIS — F330 Major depressive disorder, recurrent, mild: Secondary | ICD-10-CM | POA: Diagnosis not present

## 2018-05-25 DIAGNOSIS — F900 Attention-deficit hyperactivity disorder, predominantly inattentive type: Secondary | ICD-10-CM | POA: Diagnosis not present

## 2018-06-10 ENCOUNTER — Telehealth: Payer: Self-pay | Admitting: Obstetrics and Gynecology

## 2018-06-10 NOTE — Telephone Encounter (Signed)
The patient states she is feeling perimenopausal now that she has started BCPs, and she states her cycles were regular and now she has not had a cycle in four months.  She also needs to do labs.  She is asking if she needs to come in or just do labs?  Please advise, thanks.

## 2018-06-10 NOTE — Telephone Encounter (Signed)
Pt called no answer LM via voicemail that she needed to come in to do her labs and if she needed to come in to see [facility] to discuss her concerns that she was free to call and make an appointment for a visit.

## 2018-06-14 ENCOUNTER — Other Ambulatory Visit: Payer: Medicaid Other

## 2018-06-14 DIAGNOSIS — N951 Menopausal and female climacteric states: Secondary | ICD-10-CM

## 2018-06-14 NOTE — Telephone Encounter (Signed)
Pt was in the office today for her lab work.

## 2018-06-15 LAB — PROGESTERONE: Progesterone: 0.3 ng/mL

## 2018-06-15 LAB — ESTRADIOL: ESTRADIOL: 28.5 pg/mL

## 2018-06-15 LAB — FSH/LH
FSH: 57.4 m[IU]/mL
LH: 36.6 m[IU]/mL

## 2018-06-18 ENCOUNTER — Other Ambulatory Visit: Payer: Self-pay

## 2018-06-18 MED ORDER — NORETHINDRONE ACETATE 5 MG PO TABS: 5.0000 mg | ORAL_TABLET | Freq: Every day | ORAL | 6 refills | Status: DC

## 2018-06-29 DIAGNOSIS — I10 Essential (primary) hypertension: Secondary | ICD-10-CM | POA: Diagnosis not present

## 2018-06-29 DIAGNOSIS — E559 Vitamin D deficiency, unspecified: Secondary | ICD-10-CM | POA: Diagnosis not present

## 2018-06-29 DIAGNOSIS — F419 Anxiety disorder, unspecified: Secondary | ICD-10-CM | POA: Diagnosis not present

## 2018-06-29 DIAGNOSIS — F900 Attention-deficit hyperactivity disorder, predominantly inattentive type: Secondary | ICD-10-CM | POA: Diagnosis not present

## 2018-06-29 DIAGNOSIS — J209 Acute bronchitis, unspecified: Secondary | ICD-10-CM | POA: Diagnosis not present

## 2018-08-13 DIAGNOSIS — F900 Attention-deficit hyperactivity disorder, predominantly inattentive type: Secondary | ICD-10-CM | POA: Diagnosis not present

## 2018-08-13 DIAGNOSIS — I10 Essential (primary) hypertension: Secondary | ICD-10-CM | POA: Diagnosis not present

## 2018-08-13 DIAGNOSIS — F419 Anxiety disorder, unspecified: Secondary | ICD-10-CM | POA: Diagnosis not present

## 2018-08-13 DIAGNOSIS — E559 Vitamin D deficiency, unspecified: Secondary | ICD-10-CM | POA: Diagnosis not present

## 2018-08-13 DIAGNOSIS — L309 Dermatitis, unspecified: Secondary | ICD-10-CM | POA: Diagnosis not present

## 2018-09-01 DIAGNOSIS — F900 Attention-deficit hyperactivity disorder, predominantly inattentive type: Secondary | ICD-10-CM | POA: Diagnosis not present

## 2018-09-01 DIAGNOSIS — E559 Vitamin D deficiency, unspecified: Secondary | ICD-10-CM | POA: Diagnosis not present

## 2018-09-01 DIAGNOSIS — N390 Urinary tract infection, site not specified: Secondary | ICD-10-CM | POA: Diagnosis not present

## 2018-09-01 DIAGNOSIS — F419 Anxiety disorder, unspecified: Secondary | ICD-10-CM | POA: Diagnosis not present

## 2018-09-23 ENCOUNTER — Telehealth: Payer: Self-pay | Admitting: Obstetrics and Gynecology

## 2018-09-23 NOTE — Telephone Encounter (Signed)
Please inform patient that unfortunately since I was on vacation last week, my schedule for the next 1-2 weeks is fairly booked, but we can place her on a waiting list so that if there are any cancellations we can see her prior to the 11th.  Also, please see which medication she would prefer to use (we can put her back on a birth control (the one she was on prior), or we can increase the dose of the Aygestin from 5 mg once daily up to twice daily.

## 2018-09-23 NOTE — Telephone Encounter (Signed)
The patient called and stated that she would like to have her nurse call her back in regards to her experiencing heavy bright bleeding with large blood clots. The patient feels that she should be put on a different prescription, the patient also stated that she wants to come in to see Dr. Marcelline Mates , but does not want to wait for the next available opening 10/06/18. Please advise.

## 2018-09-24 NOTE — Telephone Encounter (Signed)
Spoke with pt and she had a lot of questions concerning if she needed to stay on her birth control or not due to her having fibroids and ovarian cyst, being over 56 and not sexually activity.  Pt was advised those were type of questions that she needed to ask [facility]. Pt was advised to schedule an appointment to speak with [facility]. Pt made an appointment today for 10/07/18.

## 2018-09-27 DIAGNOSIS — E559 Vitamin D deficiency, unspecified: Secondary | ICD-10-CM | POA: Diagnosis not present

## 2018-09-27 DIAGNOSIS — F419 Anxiety disorder, unspecified: Secondary | ICD-10-CM | POA: Diagnosis not present

## 2018-09-27 DIAGNOSIS — J3089 Other allergic rhinitis: Secondary | ICD-10-CM | POA: Diagnosis not present

## 2018-09-27 DIAGNOSIS — I10 Essential (primary) hypertension: Secondary | ICD-10-CM | POA: Diagnosis not present

## 2018-09-27 DIAGNOSIS — J029 Acute pharyngitis, unspecified: Secondary | ICD-10-CM | POA: Diagnosis not present

## 2018-10-07 ENCOUNTER — Encounter: Payer: Medicaid Other | Admitting: Obstetrics and Gynecology

## 2018-11-04 DIAGNOSIS — F419 Anxiety disorder, unspecified: Secondary | ICD-10-CM | POA: Diagnosis not present

## 2018-11-04 DIAGNOSIS — N390 Urinary tract infection, site not specified: Secondary | ICD-10-CM | POA: Diagnosis not present

## 2018-11-04 DIAGNOSIS — F900 Attention-deficit hyperactivity disorder, predominantly inattentive type: Secondary | ICD-10-CM | POA: Diagnosis not present

## 2018-11-04 DIAGNOSIS — E559 Vitamin D deficiency, unspecified: Secondary | ICD-10-CM | POA: Diagnosis not present

## 2018-11-19 ENCOUNTER — Emergency Department
Admission: EM | Admit: 2018-11-19 | Discharge: 2018-11-19 | Disposition: A | Discharge status: Home / Self Care | Payer: No Typology Code available for payment source | Attending: Emergency Medicine | Admitting: Emergency Medicine

## 2018-11-19 ENCOUNTER — Other Ambulatory Visit: Payer: Self-pay

## 2018-11-19 ENCOUNTER — Emergency Department: Payer: No Typology Code available for payment source

## 2018-11-19 ENCOUNTER — Encounter: Payer: Self-pay | Admitting: Emergency Medicine

## 2018-11-19 DIAGNOSIS — M542 Cervicalgia: Secondary | ICD-10-CM | POA: Insufficient documentation

## 2018-11-19 DIAGNOSIS — S8262XA Displaced fracture of lateral malleolus of left fibula, initial encounter for closed fracture: Secondary | ICD-10-CM | POA: Diagnosis not present

## 2018-11-19 DIAGNOSIS — S46002A Unspecified injury of muscle(s) and tendon(s) of the rotator cuff of left shoulder, initial encounter: Secondary | ICD-10-CM | POA: Diagnosis present

## 2018-11-19 DIAGNOSIS — Z79899 Other long term (current) drug therapy: Secondary | ICD-10-CM | POA: Insufficient documentation

## 2018-11-19 DIAGNOSIS — S42022A Displaced fracture of shaft of left clavicle, initial encounter for closed fracture: Secondary | ICD-10-CM | POA: Diagnosis not present

## 2018-11-19 DIAGNOSIS — Y92410 Unspecified street and highway as the place of occurrence of the external cause: Secondary | ICD-10-CM | POA: Diagnosis not present

## 2018-11-19 DIAGNOSIS — S8261XA Displaced fracture of lateral malleolus of right fibula, initial encounter for closed fracture: Secondary | ICD-10-CM | POA: Diagnosis not present

## 2018-11-19 DIAGNOSIS — Y999 Unspecified external cause status: Secondary | ICD-10-CM | POA: Diagnosis not present

## 2018-11-19 DIAGNOSIS — S42002A Fracture of unspecified part of left clavicle, initial encounter for closed fracture: Secondary | ICD-10-CM | POA: Diagnosis not present

## 2018-11-19 DIAGNOSIS — S161XXA Strain of muscle, fascia and tendon at neck level, initial encounter: Secondary | ICD-10-CM | POA: Diagnosis not present

## 2018-11-19 DIAGNOSIS — R51 Headache: Secondary | ICD-10-CM | POA: Insufficient documentation

## 2018-11-19 DIAGNOSIS — Y939 Activity, unspecified: Secondary | ICD-10-CM | POA: Diagnosis not present

## 2018-11-19 DIAGNOSIS — S199XXA Unspecified injury of neck, initial encounter: Secondary | ICD-10-CM | POA: Diagnosis not present

## 2018-11-19 DIAGNOSIS — S0990XA Unspecified injury of head, initial encounter: Secondary | ICD-10-CM | POA: Diagnosis not present

## 2018-11-19 DIAGNOSIS — R079 Chest pain, unspecified: Secondary | ICD-10-CM | POA: Diagnosis not present

## 2018-11-19 LAB — BASIC METABOLIC PANEL
Anion gap: 11 (ref 5–15)
BUN: 12 mg/dL (ref 6–20)
CO2: 23 mmol/L (ref 22–32)
Calcium: 9 mg/dL (ref 8.9–10.3)
Chloride: 103 mmol/L (ref 98–111)
Creatinine, Ser: 0.64 mg/dL (ref 0.44–1.00)
GFR calc Af Amer: >60 mL/min (ref >60)
GFR calc non Af Amer: >60 mL/min (ref >60)
Glucose, Bld: 108 mg/dL — ABNORMAL HIGH (ref 70–99)
Potassium: 3.4 mmol/L — ABNORMAL LOW (ref 3.5–5.1)
Sodium: 137 mmol/L (ref 135–145)

## 2018-11-19 LAB — URINE DRUG SCREEN, QUALITATIVE (ARMC ONLY)
Amphetamines, Ur Screen: NOT DETECTED
Barbiturates, Ur Screen: NOT DETECTED
Benzodiazepine, Ur Scrn: POSITIVE — AB
Cannabinoid 50 Ng, Ur ~~LOC~~: NOT DETECTED
Cocaine Metabolite,Ur ~~LOC~~: NOT DETECTED
MDMA (Ecstasy)Ur Screen: NOT DETECTED
Methadone Scn, Ur: NOT DETECTED
Opiate, Ur Screen: NOT DETECTED
Phencyclidine (PCP) Ur S: NOT DETECTED
Tricyclic, Ur Screen: NOT DETECTED

## 2018-11-19 LAB — GLUCOSE, CAPILLARY: Glucose-Capillary: 109 mg/dL — ABNORMAL HIGH (ref 70–99)

## 2018-11-19 LAB — CBC WITH DIFFERENTIAL/PLATELET
Abs Immature Granulocytes: 0.06 10*3/uL (ref 0.00–0.07)
Basophils Absolute: 0.1 10*3/uL (ref 0.0–0.1)
Basophils Relative: 1 %
Eosinophils Absolute: 0.2 10*3/uL (ref 0.0–0.5)
Eosinophils Relative: 2 %
HCT: 38.7 % (ref 36.0–46.0)
Hemoglobin: 12.6 g/dL (ref 12.0–15.0)
Immature Granulocytes: 1 %
Lymphocytes Relative: 16 %
Lymphs Abs: 1.6 10*3/uL (ref 0.7–4.0)
MCH: 29 pg (ref 26.0–34.0)
MCHC: 32.6 g/dL (ref 30.0–36.0)
MCV: 89 fL (ref 80.0–100.0)
Monocytes Absolute: 0.5 10*3/uL (ref 0.1–1.0)
Monocytes Relative: 6 %
Neutro Abs: 7.5 10*3/uL (ref 1.7–7.7)
Neutrophils Relative %: 74 %
Platelets: 302 10*3/uL (ref 150–400)
RBC: 4.35 MIL/uL (ref 3.87–5.11)
RDW: 12.9 % (ref 11.5–15.5)
WBC: 9.9 10*3/uL (ref 4.0–10.5)
nRBC: 0 % (ref 0.0–0.2)

## 2018-11-19 LAB — ETHANOL: Alcohol, Ethyl (B): <10 mg/dL (ref <10)

## 2018-11-19 MED ORDER — ALPRAZOLAM 0.5 MG PO TABS
1.0000 mg | ORAL_TABLET | Freq: Once | ORAL | Status: AC
  Administered 2018-11-19: 1 mg via ORAL
  Filled 2018-11-19: qty 2

## 2018-11-19 MED ORDER — HYDROCODONE-ACETAMINOPHEN 5-325 MG PO TABS: 1.0000 | ORAL_TABLET | Freq: Four times a day (QID) | ORAL | 0 refills | Status: DC | PRN

## 2018-11-19 NOTE — Discharge Instructions (Signed)
Follow up with orthopedics. Return to the ER for symptoms that change or worsen if unable to schedule an appointment.
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
## 2018-11-23 DIAGNOSIS — S82831A Other fracture of upper and lower end of right fibula, initial encounter for closed fracture: Secondary | ICD-10-CM | POA: Diagnosis not present

## 2018-11-23 DIAGNOSIS — S42022A Displaced fracture of shaft of left clavicle, initial encounter for closed fracture: Secondary | ICD-10-CM | POA: Diagnosis not present

## 2018-11-30 DIAGNOSIS — F339 Major depressive disorder, recurrent, unspecified: Secondary | ICD-10-CM | POA: Diagnosis not present

## 2018-11-30 DIAGNOSIS — F900 Attention-deficit hyperactivity disorder, predominantly inattentive type: Secondary | ICD-10-CM | POA: Diagnosis not present

## 2018-11-30 DIAGNOSIS — F410 Panic disorder [episodic paroxysmal anxiety] without agoraphobia: Secondary | ICD-10-CM | POA: Diagnosis not present

## 2018-12-02 DIAGNOSIS — S92901A Unspecified fracture of right foot, initial encounter for closed fracture: Secondary | ICD-10-CM | POA: Diagnosis not present

## 2018-12-02 DIAGNOSIS — F419 Anxiety disorder, unspecified: Secondary | ICD-10-CM | POA: Diagnosis not present

## 2018-12-02 DIAGNOSIS — F4310 Post-traumatic stress disorder, unspecified: Secondary | ICD-10-CM | POA: Diagnosis not present

## 2018-12-02 DIAGNOSIS — S42002A Fracture of unspecified part of left clavicle, initial encounter for closed fracture: Secondary | ICD-10-CM | POA: Diagnosis not present

## 2018-12-02 DIAGNOSIS — J3089 Other allergic rhinitis: Secondary | ICD-10-CM | POA: Diagnosis not present

## 2018-12-07 DIAGNOSIS — S42022D Displaced fracture of shaft of left clavicle, subsequent encounter for fracture with routine healing: Secondary | ICD-10-CM | POA: Diagnosis not present

## 2018-12-07 DIAGNOSIS — M25571 Pain in right ankle and joints of right foot: Secondary | ICD-10-CM | POA: Diagnosis not present

## 2018-12-07 DIAGNOSIS — Z882 Allergy status to sulfonamides status: Secondary | ICD-10-CM | POA: Diagnosis not present

## 2018-12-07 DIAGNOSIS — S0993XA Unspecified injury of face, initial encounter: Secondary | ICD-10-CM | POA: Diagnosis not present

## 2018-12-07 DIAGNOSIS — R079 Chest pain, unspecified: Secondary | ICD-10-CM | POA: Diagnosis not present

## 2018-12-07 DIAGNOSIS — G44309 Post-traumatic headache, unspecified, not intractable: Secondary | ICD-10-CM | POA: Diagnosis not present

## 2018-12-07 DIAGNOSIS — Z880 Allergy status to penicillin: Secondary | ICD-10-CM | POA: Diagnosis not present

## 2018-12-07 DIAGNOSIS — I10 Essential (primary) hypertension: Secondary | ICD-10-CM | POA: Diagnosis not present

## 2018-12-07 DIAGNOSIS — R0789 Other chest pain: Secondary | ICD-10-CM | POA: Diagnosis not present

## 2018-12-07 DIAGNOSIS — S82831D Other fracture of upper and lower end of right fibula, subsequent encounter for closed fracture with routine healing: Secondary | ICD-10-CM | POA: Diagnosis not present

## 2018-12-07 DIAGNOSIS — R51 Headache: Secondary | ICD-10-CM | POA: Diagnosis not present

## 2018-12-07 DIAGNOSIS — Z87891 Personal history of nicotine dependence: Secondary | ICD-10-CM | POA: Diagnosis not present

## 2018-12-16 DIAGNOSIS — F900 Attention-deficit hyperactivity disorder, predominantly inattentive type: Secondary | ICD-10-CM | POA: Diagnosis not present

## 2018-12-16 DIAGNOSIS — E559 Vitamin D deficiency, unspecified: Secondary | ICD-10-CM | POA: Diagnosis not present

## 2018-12-16 DIAGNOSIS — F419 Anxiety disorder, unspecified: Secondary | ICD-10-CM | POA: Diagnosis not present

## 2018-12-16 DIAGNOSIS — S92901A Unspecified fracture of right foot, initial encounter for closed fracture: Secondary | ICD-10-CM | POA: Diagnosis not present

## 2018-12-31 DIAGNOSIS — S42022D Displaced fracture of shaft of left clavicle, subsequent encounter for fracture with routine healing: Secondary | ICD-10-CM | POA: Diagnosis not present

## 2018-12-31 DIAGNOSIS — Z7689 Persons encountering health services in other specified circumstances: Secondary | ICD-10-CM | POA: Diagnosis not present

## 2018-12-31 DIAGNOSIS — S82831D Other fracture of upper and lower end of right fibula, subsequent encounter for closed fracture with routine healing: Secondary | ICD-10-CM | POA: Diagnosis not present

## 2019-01-04 DIAGNOSIS — E559 Vitamin D deficiency, unspecified: Secondary | ICD-10-CM | POA: Diagnosis not present

## 2019-01-04 DIAGNOSIS — F419 Anxiety disorder, unspecified: Secondary | ICD-10-CM | POA: Diagnosis not present

## 2019-01-04 DIAGNOSIS — N760 Acute vaginitis: Secondary | ICD-10-CM | POA: Diagnosis not present

## 2019-01-04 DIAGNOSIS — F900 Attention-deficit hyperactivity disorder, predominantly inattentive type: Secondary | ICD-10-CM | POA: Diagnosis not present

## 2019-01-13 DIAGNOSIS — F419 Anxiety disorder, unspecified: Secondary | ICD-10-CM | POA: Diagnosis not present

## 2019-01-13 DIAGNOSIS — F900 Attention-deficit hyperactivity disorder, predominantly inattentive type: Secondary | ICD-10-CM | POA: Diagnosis not present

## 2019-01-13 DIAGNOSIS — E559 Vitamin D deficiency, unspecified: Secondary | ICD-10-CM | POA: Diagnosis not present

## 2019-01-13 DIAGNOSIS — E8801 Alpha-1-antitrypsin deficiency: Secondary | ICD-10-CM | POA: Diagnosis not present

## 2019-01-14 DIAGNOSIS — Z7689 Persons encountering health services in other specified circumstances: Secondary | ICD-10-CM | POA: Diagnosis not present

## 2019-01-20 DIAGNOSIS — E559 Vitamin D deficiency, unspecified: Secondary | ICD-10-CM | POA: Diagnosis not present

## 2019-01-20 DIAGNOSIS — F900 Attention-deficit hyperactivity disorder, predominantly inattentive type: Secondary | ICD-10-CM | POA: Diagnosis not present

## 2019-01-20 DIAGNOSIS — M542 Cervicalgia: Secondary | ICD-10-CM | POA: Diagnosis not present

## 2019-01-20 DIAGNOSIS — N390 Urinary tract infection, site not specified: Secondary | ICD-10-CM | POA: Diagnosis not present

## 2019-01-30 ENCOUNTER — Other Ambulatory Visit: Payer: Self-pay

## 2019-01-30 DIAGNOSIS — R600 Localized edema: Secondary | ICD-10-CM | POA: Diagnosis not present

## 2019-01-30 DIAGNOSIS — Z5321 Procedure and treatment not carried out due to patient leaving prior to being seen by health care provider: Secondary | ICD-10-CM | POA: Diagnosis not present
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
## 2019-01-31 ENCOUNTER — Emergency Department: Payer: Medicaid Other

## 2019-01-31 ENCOUNTER — Emergency Department
Admission: EM | Admit: 2019-01-31 | Discharge: 2019-01-31 | Disposition: A | Discharge status: Home / Self Care | Payer: Medicaid Other | Attending: Emergency Medicine | Admitting: Emergency Medicine

## 2019-01-31 DIAGNOSIS — S82831D Other fracture of upper and lower end of right fibula, subsequent encounter for closed fracture with routine healing: Secondary | ICD-10-CM | POA: Diagnosis not present

## 2019-01-31 DIAGNOSIS — R600 Localized edema: Secondary | ICD-10-CM | POA: Diagnosis not present

## 2019-01-31 DIAGNOSIS — S42022D Displaced fracture of shaft of left clavicle, subsequent encounter for fracture with routine healing: Secondary | ICD-10-CM | POA: Diagnosis not present

## 2019-01-31 DIAGNOSIS — M7989 Other specified soft tissue disorders: Secondary | ICD-10-CM

## 2019-01-31 NOTE — ED Notes (Signed)
Pt came out of room and sts, "Im leaving. I need to talk with someone who can take my visit charges off my insurance cause I aint seen a doctor in 6 fucking hours. How do I need to get out of here?" Pt instructed that she could call the hospitals billing department with questions and concerns. Pt walked down hall, swearing.
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
## 2019-02-21 DIAGNOSIS — S82831D Other fracture of upper and lower end of right fibula, subsequent encounter for closed fracture with routine healing: Secondary | ICD-10-CM | POA: Diagnosis not present

## 2019-02-21 DIAGNOSIS — E559 Vitamin D deficiency, unspecified: Secondary | ICD-10-CM | POA: Diagnosis not present

## 2019-02-21 DIAGNOSIS — M542 Cervicalgia: Secondary | ICD-10-CM | POA: Diagnosis not present

## 2019-02-21 DIAGNOSIS — M1711 Unilateral primary osteoarthritis, right knee: Secondary | ICD-10-CM | POA: Diagnosis not present

## 2019-02-21 DIAGNOSIS — S42022D Displaced fracture of shaft of left clavicle, subsequent encounter for fracture with routine healing: Secondary | ICD-10-CM | POA: Diagnosis not present

## 2019-02-21 DIAGNOSIS — F900 Attention-deficit hyperactivity disorder, predominantly inattentive type: Secondary | ICD-10-CM | POA: Diagnosis not present

## 2019-02-21 DIAGNOSIS — M25561 Pain in right knee: Secondary | ICD-10-CM | POA: Diagnosis not present

## 2019-02-21 DIAGNOSIS — N390 Urinary tract infection, site not specified: Secondary | ICD-10-CM | POA: Diagnosis not present

## 2019-03-15 DIAGNOSIS — E559 Vitamin D deficiency, unspecified: Secondary | ICD-10-CM | POA: Diagnosis not present

## 2019-03-15 DIAGNOSIS — F900 Attention-deficit hyperactivity disorder, predominantly inattentive type: Secondary | ICD-10-CM | POA: Diagnosis not present

## 2019-03-15 DIAGNOSIS — F419 Anxiety disorder, unspecified: Secondary | ICD-10-CM | POA: Diagnosis not present

## 2019-03-15 DIAGNOSIS — H6090 Unspecified otitis externa, unspecified ear: Secondary | ICD-10-CM | POA: Diagnosis not present

## 2019-03-21 DIAGNOSIS — Z596 Low income: Secondary | ICD-10-CM | POA: Diagnosis not present

## 2019-03-21 DIAGNOSIS — S42022D Displaced fracture of shaft of left clavicle, subsequent encounter for fracture with routine healing: Secondary | ICD-10-CM | POA: Diagnosis not present

## 2019-04-01 DIAGNOSIS — E8801 Alpha-1-antitrypsin deficiency: Secondary | ICD-10-CM | POA: Diagnosis not present

## 2019-04-01 DIAGNOSIS — F900 Attention-deficit hyperactivity disorder, predominantly inattentive type: Secondary | ICD-10-CM | POA: Diagnosis not present

## 2019-04-01 DIAGNOSIS — N390 Urinary tract infection, site not specified: Secondary | ICD-10-CM | POA: Diagnosis not present

## 2019-04-01 DIAGNOSIS — J3089 Other allergic rhinitis: Secondary | ICD-10-CM | POA: Diagnosis not present

## 2019-04-26 DIAGNOSIS — F900 Attention-deficit hyperactivity disorder, predominantly inattentive type: Secondary | ICD-10-CM | POA: Diagnosis not present

## 2019-04-26 DIAGNOSIS — N390 Urinary tract infection, site not specified: Secondary | ICD-10-CM | POA: Diagnosis not present

## 2019-04-26 DIAGNOSIS — J3089 Other allergic rhinitis: Secondary | ICD-10-CM | POA: Diagnosis not present

## 2019-04-26 DIAGNOSIS — M542 Cervicalgia: Secondary | ICD-10-CM | POA: Diagnosis not present

## 2019-06-29 DIAGNOSIS — F339 Major depressive disorder, recurrent, unspecified: Secondary | ICD-10-CM | POA: Diagnosis not present

## 2019-06-29 DIAGNOSIS — F900 Attention-deficit hyperactivity disorder, predominantly inattentive type: Secondary | ICD-10-CM | POA: Diagnosis not present

## 2019-06-29 DIAGNOSIS — F410 Panic disorder [episodic paroxysmal anxiety] without agoraphobia: Secondary | ICD-10-CM | POA: Diagnosis not present

## 2019-07-15 DIAGNOSIS — J3089 Other allergic rhinitis: Secondary | ICD-10-CM | POA: Diagnosis not present

## 2019-07-15 DIAGNOSIS — E559 Vitamin D deficiency, unspecified: Secondary | ICD-10-CM | POA: Diagnosis not present

## 2019-07-15 DIAGNOSIS — F419 Anxiety disorder, unspecified: Secondary | ICD-10-CM | POA: Diagnosis not present

## 2019-07-15 DIAGNOSIS — F900 Attention-deficit hyperactivity disorder, predominantly inattentive type: Secondary | ICD-10-CM | POA: Diagnosis not present

## 2019-07-18 DIAGNOSIS — F900 Attention-deficit hyperactivity disorder, predominantly inattentive type: Secondary | ICD-10-CM | POA: Diagnosis not present

## 2019-07-18 DIAGNOSIS — F419 Anxiety disorder, unspecified: Secondary | ICD-10-CM | POA: Diagnosis not present

## 2019-07-18 DIAGNOSIS — N390 Urinary tract infection, site not specified: Secondary | ICD-10-CM | POA: Diagnosis not present

## 2019-07-18 DIAGNOSIS — E8801 Alpha-1-antitrypsin deficiency: Secondary | ICD-10-CM | POA: Diagnosis not present

## 2019-08-04 DIAGNOSIS — E559 Vitamin D deficiency, unspecified: Secondary | ICD-10-CM | POA: Diagnosis not present

## 2019-08-04 DIAGNOSIS — F419 Anxiety disorder, unspecified: Secondary | ICD-10-CM | POA: Diagnosis not present

## 2019-08-04 DIAGNOSIS — F900 Attention-deficit hyperactivity disorder, predominantly inattentive type: Secondary | ICD-10-CM | POA: Diagnosis not present

## 2019-08-04 DIAGNOSIS — J3089 Other allergic rhinitis: Secondary | ICD-10-CM | POA: Diagnosis not present

## 2019-09-01 DIAGNOSIS — F419 Anxiety disorder, unspecified: Secondary | ICD-10-CM | POA: Diagnosis not present

## 2019-09-01 DIAGNOSIS — J3089 Other allergic rhinitis: Secondary | ICD-10-CM | POA: Diagnosis not present

## 2019-09-01 DIAGNOSIS — M545 Low back pain: Secondary | ICD-10-CM | POA: Diagnosis not present

## 2019-09-01 DIAGNOSIS — F900 Attention-deficit hyperactivity disorder, predominantly inattentive type: Secondary | ICD-10-CM | POA: Diagnosis not present

## 2019-09-05 DIAGNOSIS — N390 Urinary tract infection, site not specified: Secondary | ICD-10-CM | POA: Diagnosis not present

## 2019-09-05 DIAGNOSIS — F900 Attention-deficit hyperactivity disorder, predominantly inattentive type: Secondary | ICD-10-CM | POA: Diagnosis not present

## 2019-09-05 DIAGNOSIS — E559 Vitamin D deficiency, unspecified: Secondary | ICD-10-CM | POA: Diagnosis not present

## 2019-09-05 DIAGNOSIS — F419 Anxiety disorder, unspecified: Secondary | ICD-10-CM | POA: Diagnosis not present

## 2019-10-06 DIAGNOSIS — F900 Attention-deficit hyperactivity disorder, predominantly inattentive type: Secondary | ICD-10-CM | POA: Diagnosis not present

## 2019-10-06 DIAGNOSIS — F410 Panic disorder [episodic paroxysmal anxiety] without agoraphobia: Secondary | ICD-10-CM | POA: Diagnosis not present

## 2019-10-06 DIAGNOSIS — E559 Vitamin D deficiency, unspecified: Secondary | ICD-10-CM | POA: Diagnosis not present

## 2019-10-06 DIAGNOSIS — F419 Anxiety disorder, unspecified: Secondary | ICD-10-CM | POA: Diagnosis not present

## 2019-10-17 DIAGNOSIS — F419 Anxiety disorder, unspecified: Secondary | ICD-10-CM | POA: Diagnosis not present

## 2019-10-17 DIAGNOSIS — E559 Vitamin D deficiency, unspecified: Secondary | ICD-10-CM | POA: Diagnosis not present

## 2019-10-17 DIAGNOSIS — J3089 Other allergic rhinitis: Secondary | ICD-10-CM | POA: Diagnosis not present

## 2019-10-17 DIAGNOSIS — F900 Attention-deficit hyperactivity disorder, predominantly inattentive type: Secondary | ICD-10-CM | POA: Diagnosis not present

## 2019-10-31 DIAGNOSIS — F339 Major depressive disorder, recurrent, unspecified: Secondary | ICD-10-CM | POA: Diagnosis not present

## 2019-10-31 DIAGNOSIS — F410 Panic disorder [episodic paroxysmal anxiety] without agoraphobia: Secondary | ICD-10-CM | POA: Diagnosis not present

## 2019-11-18 DIAGNOSIS — R05 Cough: Secondary | ICD-10-CM | POA: Diagnosis not present

## 2019-11-18 DIAGNOSIS — E559 Vitamin D deficiency, unspecified: Secondary | ICD-10-CM | POA: Diagnosis not present

## 2019-11-18 DIAGNOSIS — F419 Anxiety disorder, unspecified: Secondary | ICD-10-CM | POA: Diagnosis not present

## 2019-11-18 DIAGNOSIS — F900 Attention-deficit hyperactivity disorder, predominantly inattentive type: Secondary | ICD-10-CM | POA: Diagnosis not present

## 2019-11-25 DIAGNOSIS — F419 Anxiety disorder, unspecified: Secondary | ICD-10-CM | POA: Diagnosis not present

## 2019-11-25 DIAGNOSIS — F900 Attention-deficit hyperactivity disorder, predominantly inattentive type: Secondary | ICD-10-CM | POA: Diagnosis not present

## 2019-11-25 DIAGNOSIS — E559 Vitamin D deficiency, unspecified: Secondary | ICD-10-CM | POA: Diagnosis not present

## 2019-11-25 DIAGNOSIS — J3089 Other allergic rhinitis: Secondary | ICD-10-CM | POA: Diagnosis not present
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
## 2019-12-14 DIAGNOSIS — E559 Vitamin D deficiency, unspecified: Secondary | ICD-10-CM | POA: Diagnosis not present

## 2019-12-14 DIAGNOSIS — F900 Attention-deficit hyperactivity disorder, predominantly inattentive type: Secondary | ICD-10-CM | POA: Diagnosis not present

## 2019-12-14 DIAGNOSIS — F419 Anxiety disorder, unspecified: Secondary | ICD-10-CM | POA: Diagnosis not present

## 2019-12-14 DIAGNOSIS — J029 Acute pharyngitis, unspecified: Secondary | ICD-10-CM | POA: Diagnosis not present

## 2020-01-30 DIAGNOSIS — R079 Chest pain, unspecified: Secondary | ICD-10-CM | POA: Diagnosis not present

## 2020-01-30 DIAGNOSIS — R61 Generalized hyperhidrosis: Secondary | ICD-10-CM | POA: Insufficient documentation

## 2020-01-30 DIAGNOSIS — Z5321 Procedure and treatment not carried out due to patient leaving prior to being seen by health care provider: Secondary | ICD-10-CM | POA: Diagnosis not present

## 2020-01-30 DIAGNOSIS — I10 Essential (primary) hypertension: Secondary | ICD-10-CM | POA: Diagnosis not present

## 2020-01-30 DIAGNOSIS — R42 Dizziness and giddiness: Secondary | ICD-10-CM | POA: Diagnosis not present

## 2020-01-31 ENCOUNTER — Other Ambulatory Visit: Payer: Self-pay

## 2020-01-31 ENCOUNTER — Emergency Department
Admission: EM | Admit: 2020-01-31 | Discharge: 2020-01-31 | Disposition: A | Discharge status: Home / Self Care | Payer: Medicaid Other | Attending: Emergency Medicine | Admitting: Emergency Medicine

## 2020-01-31 ENCOUNTER — Emergency Department: Payer: Medicaid Other

## 2020-01-31 DIAGNOSIS — R42 Dizziness and giddiness: Secondary | ICD-10-CM | POA: Diagnosis not present

## 2020-01-31 DIAGNOSIS — R079 Chest pain, unspecified: Secondary | ICD-10-CM | POA: Diagnosis not present

## 2020-01-31 LAB — CBC
HCT: 36.7 % (ref 36.0–46.0)
Hemoglobin: 12.8 g/dL (ref 12.0–15.0)
MCH: 28.8 pg (ref 26.0–34.0)
MCHC: 34.9 g/dL (ref 30.0–36.0)
MCV: 82.5 fL (ref 80.0–100.0)
Platelets: 269 10*3/uL (ref 150–400)
RBC: 4.45 MIL/uL (ref 3.87–5.11)
RDW: 12.9 % (ref 11.5–15.5)
WBC: 8.1 10*3/uL (ref 4.0–10.5)
nRBC: 0 % (ref 0.0–0.2)

## 2020-01-31 LAB — BASIC METABOLIC PANEL
Anion gap: 11 (ref 5–15)
BUN: 9 mg/dL (ref 6–20)
CO2: 24 mmol/L (ref 22–32)
Calcium: 9.4 mg/dL (ref 8.9–10.3)
Chloride: 107 mmol/L (ref 98–111)
Creatinine, Ser: 0.77 mg/dL (ref 0.44–1.00)
GFR calc Af Amer: >60 mL/min (ref >60)
GFR calc non Af Amer: >60 mL/min (ref >60)
Glucose, Bld: 102 mg/dL — ABNORMAL HIGH (ref 70–99)
Potassium: 3.6 mmol/L (ref 3.5–5.1)
Sodium: 142 mmol/L (ref 135–145)

## 2020-01-31 LAB — TROPONIN I (HIGH SENSITIVITY): Troponin I (High Sensitivity): 4 ng/L (ref <18)

## 2020-01-31 LAB — POCT PREGNANCY, URINE: Preg Test, Ur: NEGATIVE

## 2020-01-31 NOTE — ED Notes (Signed)
Patient up to stat desk asking about wait times. Patient informed of current place in line. Patient informed that her vitals and blood work will be updated soon. Patient verbalized understanding. Patient waiting outside.

## 2020-01-31 NOTE — ED Notes (Signed)
Patient up to stat desk asking about wait time. Patient informed of current number of people waiting ahead of her for a bed. Patient unsatisfied with answer. Patient states "I hope I don't just die in your waiting room". Patient informed that her vital signs and blood work stable at this time, and that RN will continue to monitor her while she is waiting for a room. Patient verbalized understanding. Patient back to waiting area.

## 2020-01-31 NOTE — ED Notes (Signed)
Patient requesting to have vital signs rechecked. Patient revitalized. Patient again told of current place in line for a room. Patient back outside to wait for room.

## 2020-01-31 NOTE — ED Triage Notes (Signed)
Pt arrives to ED via POV from home with c/o chest pain and dizziness. Pt states she woke up tonight "in a pool of sweat" and was told to seek treatment by her PCP. Pt reports h/x of HTN. Pt reports CP is centralized with radiation into the neck and jaw. Pt reports diaphoresis, but denies N/V. Pt is A&O, in NAD; RR even, regular, and unlabored.

## 2020-01-31 NOTE — ED Notes (Signed)
Patient up to stat desk asking about current place in line. Patient informed of current number waiting ahead of patient. Patient informed RN that she had a problem arranging for transportation home and needs to be roomed sooner. Wait process again explained to patient. Patient back to outside to wait for room.

## 2020-01-31 NOTE — ED Notes (Signed)
Patient up to stat desk asking about wait times. Triage/wait process explained to patient. Patient verbalized understanding. Patient back to waiting area.

## 2020-01-31 NOTE — ED Notes (Signed)
Patient called for repeat labs; no answer. Not seen in lobby, bathroom, or outside.

## 2020-01-31 NOTE — ED Notes (Signed)
Patient's son inside to ask for proof that patient was seen in ED/ test results. Patient's son informed that papers would be provided at discharge.

## 2020-02-01 DIAGNOSIS — I10 Essential (primary) hypertension: Secondary | ICD-10-CM | POA: Diagnosis not present

## 2020-02-01 DIAGNOSIS — E559 Vitamin D deficiency, unspecified: Secondary | ICD-10-CM | POA: Diagnosis not present

## 2020-02-01 DIAGNOSIS — F900 Attention-deficit hyperactivity disorder, predominantly inattentive type: Secondary | ICD-10-CM | POA: Diagnosis not present

## 2020-02-01 DIAGNOSIS — M545 Low back pain: Secondary | ICD-10-CM | POA: Diagnosis not present

## 2020-02-01 DIAGNOSIS — F419 Anxiety disorder, unspecified: Secondary | ICD-10-CM | POA: Diagnosis not present

## 2020-05-14 DIAGNOSIS — F900 Attention-deficit hyperactivity disorder, predominantly inattentive type: Secondary | ICD-10-CM | POA: Diagnosis not present

## 2020-05-14 DIAGNOSIS — F339 Major depressive disorder, recurrent, unspecified: Secondary | ICD-10-CM | POA: Diagnosis not present

## 2020-05-14 DIAGNOSIS — F410 Panic disorder [episodic paroxysmal anxiety] without agoraphobia: Secondary | ICD-10-CM | POA: Diagnosis not present

## 2020-06-01 ENCOUNTER — Telehealth: Payer: Self-pay

## 2020-06-01 NOTE — Telephone Encounter (Signed)
Error

## 2020-06-14 ENCOUNTER — Encounter: Payer: Medicaid Other | Admitting: Obstetrics and Gynecology

## 2020-06-14 NOTE — Progress Notes (Deleted)
Pt present today due to ovarian cyst. Pt would like to discuss treatment for ovarian cyst.

## 2020-06-19 ENCOUNTER — Encounter: Payer: Self-pay | Admitting: Obstetrics and Gynecology

## 2020-06-28 DIAGNOSIS — L989 Disorder of the skin and subcutaneous tissue, unspecified: Secondary | ICD-10-CM | POA: Diagnosis not present

## 2020-06-28 DIAGNOSIS — N760 Acute vaginitis: Secondary | ICD-10-CM | POA: Diagnosis not present

## 2020-06-28 DIAGNOSIS — N390 Urinary tract infection, site not specified: Secondary | ICD-10-CM | POA: Diagnosis not present

## 2020-06-28 DIAGNOSIS — R879 Unspecified abnormal finding in specimens from female genital organs: Secondary | ICD-10-CM | POA: Diagnosis not present

## 2020-06-28 DIAGNOSIS — O019 Hydatidiform mole, unspecified: Secondary | ICD-10-CM | POA: Diagnosis not present

## 2020-07-03 DIAGNOSIS — F410 Panic disorder [episodic paroxysmal anxiety] without agoraphobia: Secondary | ICD-10-CM | POA: Diagnosis not present

## 2020-07-03 DIAGNOSIS — F900 Attention-deficit hyperactivity disorder, predominantly inattentive type: Secondary | ICD-10-CM | POA: Diagnosis not present

## 2020-07-03 DIAGNOSIS — F339 Major depressive disorder, recurrent, unspecified: Secondary | ICD-10-CM | POA: Diagnosis not present

## 2020-08-27 ENCOUNTER — Telehealth: Payer: Self-pay

## 2020-08-27 NOTE — Telephone Encounter (Unsigned)
Copied from [place] [phone]. Topic: General - Other >> Aug 27, 2020 10:30 AM Alanda Slim E wrote: Reason for CRM: Lynn Ito from [facility] called about a member grievance  / please advise Marlana Salvage asked to speak with the office manager about this matter >> Aug 27, 2020 10:35 AM Alanda Slim E wrote: They called [facility] but the pt has an appt at [place]/ so she may be a [place] pt and they called the wrong office

## 2020-09-04 ENCOUNTER — Telehealth: Payer: Self-pay

## 2020-09-04 NOTE — Telephone Encounter (Signed)
Copied from [place] [phone]. Topic: General - Other >> Sep 04, 2020 10:42 AM Alanda Slim E wrote: Reason for CRM: Pt returned Amy's call/ Pt stated she is confirming her appt for March and she has taken care of the Grievance with [employer] and stated they did that on their own and she had them take it off/ please advise

## 2020-09-05 ENCOUNTER — Other Ambulatory Visit: Payer: Self-pay

## 2020-09-05 ENCOUNTER — Encounter: Payer: Self-pay | Admitting: Obstetrics and Gynecology

## 2020-09-05 ENCOUNTER — Ambulatory Visit (INDEPENDENT_AMBULATORY_CARE_PROVIDER_SITE_OTHER): Payer: Medicaid Other | Admitting: Obstetrics and Gynecology

## 2020-09-05 VITALS — BP 141/81 | HR 83 | Ht 66.0 in | Wt 188.4 lb

## 2020-09-05 DIAGNOSIS — Z780 Asymptomatic menopausal state: Secondary | ICD-10-CM | POA: Diagnosis not present

## 2020-09-05 DIAGNOSIS — R102 Pelvic and perineal pain: Secondary | ICD-10-CM | POA: Diagnosis not present

## 2020-09-05 DIAGNOSIS — Z8742 Personal history of other diseases of the female genital tract: Secondary | ICD-10-CM | POA: Diagnosis not present

## 2020-09-05 DIAGNOSIS — R399 Unspecified symptoms and signs involving the genitourinary system: Secondary | ICD-10-CM | POA: Diagnosis not present

## 2020-09-05 LAB — POCT URINALYSIS DIPSTICK
Bilirubin, UA: NEGATIVE
Glucose, UA: NEGATIVE
Ketones, UA: NEGATIVE
Leukocytes, UA: NEGATIVE
Nitrite, UA: NEGATIVE
Protein, UA: NEGATIVE
Spec Grav, UA: 1.01 (ref 1.010–1.025)
Urobilinogen, UA: 0.2 E.U./dL
pH, UA: 6 (ref 5.0–8.0)

## 2020-09-05 MED ORDER — FOSFOMYCIN TROMETHAMINE 3 G PO PACK: 3.0000 g | PACK | Freq: Once | ORAL | 0 refills | Status: AC

## 2020-09-05 NOTE — Patient Instructions (Signed)
Pelvic Pain, Female Pelvic pain is pain in your lower belly (abdomen), below your belly button and between your hips. The pain may start suddenly (be acute), keep coming back (be recurring), or last a long time (become chronic). Pelvic pain that lasts longer than 6 months is called chronic pelvic pain. There are many causes of pelvic pain. Sometimes the cause of pelvic pain is not known. Follow these instructions at home:  Take over-the-counter and prescription medicines only as told by your doctor.  Rest as told by your doctor.  Do not have sex if it hurts.  Keep a journal of your pelvic pain. Write down: ? When the pain started. ? Where the pain is located. ? What seems to make the pain better or worse, such as food or your period (menstrual cycle). ? Any symptoms you have along with the pain.  Keep all follow-up visits as told by your doctor. This is important.   Contact a doctor if:  Medicine does not help your pain.  Your pain comes back.  You have new symptoms.  You have unusual discharge or bleeding from your vagina.  You have a fever or chills.  You are having trouble pooping (constipation).  You have blood in your pee (urine) or poop (stool).  Your pee smells bad.  You feel weak or light-headed. Get help right away if:  You have sudden pain that is very bad.  Your pain keeps getting worse.  You have very bad pain and also have any of these symptoms: ? A fever. ? Feeling sick to your stomach (nausea). ? Throwing up (vomiting). ? Being very sweaty.  You pass out (lose consciousness). Summary  Pelvic pain is pain in your lower belly (abdomen), below your belly button and between your hips.  There are many possible causes of pelvic pain.  Keep a journal of your pelvic pain. This information is not intended to replace advice given to you by your health care provider. Make sure you discuss any questions you have with your health care provider. Document  Revised: 12/30/2017 Document Reviewed: 12/30/2017 Elsevier Patient Education  [place].

## 2020-09-05 NOTE — Progress Notes (Signed)
Pt present for uti symptoms, ovary pain and other issues.

## 2020-09-06 LAB — FSH/LH
FSH: 92.3 m[IU]/mL
LH: 48.2 m[IU]/mL
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
## 2020-09-07 ENCOUNTER — Telehealth: Payer: Self-pay | Admitting: Obstetrics and Gynecology

## 2020-09-07 NOTE — Telephone Encounter (Signed)
Please review result notes.

## 2020-09-07 NOTE — Telephone Encounter (Signed)
Patient called today inuring about recent labs. Please Advise.

## 2020-09-07 NOTE — Telephone Encounter (Signed)
Please see result notes.  

## 2020-09-07 NOTE — Telephone Encounter (Signed)
F/u   Returning call back for test results   

## 2020-09-08 ENCOUNTER — Encounter: Payer: Self-pay | Admitting: Obstetrics and Gynecology

## 2020-09-08 MED ORDER — PREMPRO 0.45-1.5 MG PO TABS: 1.0000 | ORAL_TABLET | Freq: Every day | ORAL | 3 refills | Status: DC

## 2020-09-10 NOTE — Progress Notes (Signed)
Medication was sent in for her over the weekend. Hopefully she was informed and will pick it up. She will need to f/u with me in 4-6 weeks to see if dose needs to be adjusted.

## 2020-09-12 ENCOUNTER — Ambulatory Visit (INDEPENDENT_AMBULATORY_CARE_PROVIDER_SITE_OTHER): Payer: Medicaid Other

## 2020-09-12 ENCOUNTER — Telehealth: Payer: Self-pay

## 2020-09-12 ENCOUNTER — Other Ambulatory Visit: Payer: Self-pay

## 2020-09-12 DIAGNOSIS — Z8742 Personal history of other diseases of the female genital tract: Secondary | ICD-10-CM | POA: Diagnosis not present

## 2020-09-12 NOTE — Telephone Encounter (Signed)
Copied from [place] [id number]. Topic: General - Other >> Sep 12, 2020  1:33 PM Leward Quan A wrote: Reason for CRM: Patient received a call but stated that it came in from [facility] not sure if someone from this office called her to reschedule her appointment. Please advise Ph# [phone]

## 2020-09-12 NOTE — Telephone Encounter (Signed)
Pt called stating that she will give office a call back to get appt rescheduled once her insurance is settled. Please advise.

## 2020-09-14 ENCOUNTER — Telehealth: Payer: Self-pay

## 2020-09-14 NOTE — Telephone Encounter (Signed)
Pt called in and stated that she would a call back about her labs and u/s. I old the pt I will send a message and a nurse will be in touch to please allow  24-48 hours for a reply. Please advise

## 2020-09-14 NOTE — Telephone Encounter (Signed)
Spoke to pt and went over urine results.

## 2020-09-19 ENCOUNTER — Telehealth: Payer: Self-pay | Admitting: Obstetrics and Gynecology

## 2020-09-19 NOTE — Telephone Encounter (Signed)
error 

## 2020-09-19 NOTE — Telephone Encounter (Signed)
Patient called today asking about results from recent ultrasound. Please Advise.

## 2020-09-24 ENCOUNTER — Telehealth: Payer: Self-pay | Admitting: Obstetrics and Gynecology

## 2020-09-24 NOTE — Telephone Encounter (Signed)
Patient called this morning stating that she is concerned about ultrasound results- states that she saw the cyst herself on scan and is wanting to know doctors opinion and next steps.

## 2020-09-24 NOTE — Telephone Encounter (Signed)
Please see result notes.  

## 2020-10-16 ENCOUNTER — Ambulatory Visit
Admission: EM | Admit: 2020-10-16 | Discharge: 2020-10-16 | Disposition: A | Discharge status: Home / Self Care | Payer: Medicaid Other | Attending: Sports Medicine | Admitting: Sports Medicine

## 2020-10-16 ENCOUNTER — Other Ambulatory Visit: Payer: Self-pay

## 2020-10-16 ENCOUNTER — Telehealth: Payer: Self-pay | Admitting: Obstetrics and Gynecology

## 2020-10-16 DIAGNOSIS — R300 Dysuria: Secondary | ICD-10-CM

## 2020-10-16 DIAGNOSIS — R3915 Urgency of urination: Secondary | ICD-10-CM | POA: Insufficient documentation

## 2020-10-16 DIAGNOSIS — R350 Frequency of micturition: Secondary | ICD-10-CM | POA: Diagnosis not present

## 2020-10-16 DIAGNOSIS — N3000 Acute cystitis without hematuria: Secondary | ICD-10-CM | POA: Insufficient documentation

## 2020-10-16 LAB — URINALYSIS, COMPLETE (UACMP) WITH MICROSCOPIC
Bacteria, UA: NONE SEEN
Bilirubin Urine: NEGATIVE
Glucose, UA: NEGATIVE mg/dL
Ketones, ur: NEGATIVE mg/dL
Leukocytes,Ua: NEGATIVE
Nitrite: NEGATIVE
Protein, ur: NEGATIVE mg/dL
Specific Gravity, Urine: 1.01 (ref 1.005–1.030)
Squamous Epithelial / HPF: NONE SEEN (ref 0–5)
pH: 5.5 (ref 5.0–8.0)

## 2020-10-16 LAB — WET PREP, GENITAL
Clue Cells Wet Prep HPF POC: NONE SEEN
Sperm: NONE SEEN
Trich, Wet Prep: NONE SEEN
Yeast Wet Prep HPF POC: NONE SEEN

## 2020-10-16 MED ORDER — FOSFOMYCIN TROMETHAMINE 3 G PO PACK: PACK | ORAL | 0 refills | Status: DC

## 2020-10-16 NOTE — Discharge Instructions (Addendum)
Your urine does not show that you have a urinary tract infection. Your wet prep does not show you have trichomonas, or BV.  However there are white cells in your wet prep. I recommend that you call urology as you are having urological symptoms and I do not have a source to treat. I have made the referral but I have also given you the information to call directly to get an appointment.  I hope you get the feeling better, Dr. Drema Dallas

## 2020-10-16 NOTE — ED Triage Notes (Signed)
Pt c/o abdominal swelling/distention for about the past week, pt also reports dysuria and some possible color changes to her urine. Pt also reports nausea. Pt denies f/v/d or other symptoms. Pt does report hx of ovarian cysts. Pt also has hx of kidney issues.

## 2020-10-16 NOTE — Telephone Encounter (Signed)
Spoke to pt and she is aware that her medication has been refilled and sent to her pharamcy.

## 2020-10-16 NOTE — Telephone Encounter (Signed)
Shirley [name] called in and states she has chronic UTI's but can't get in to her new doctor until May 30th.  Pasty wanted to know if Dr. Marcelline Mates would prescribe Monurol for her again.  If so, she would like that sent to [facility] in [place].  Patient requested a call back to let her know either way.

## 2020-10-16 NOTE — Telephone Encounter (Signed)
That's fine. If it doesn't get better she needs to be seen.

## 2020-10-16 NOTE — Telephone Encounter (Signed)
Please see another phone encounter.  

## 2020-10-16 NOTE — Telephone Encounter (Signed)
Please advised. Pt called back again about 10 minutes ago asking for something for her UTI.   Shirley [name]

## 2020-10-18 ENCOUNTER — Ambulatory Visit: Payer: Medicaid Other | Admitting: Family Medicine

## 2020-10-23 ENCOUNTER — Ambulatory Visit: Payer: Medicaid Other | Admitting: Obstetrics and Gynecology

## 2020-10-23 NOTE — ED Provider Notes (Signed)
[facility]    CSN: [id number] Arrival date & time: 10/16/20  1556      History   Chief Complaint Chief Complaint  Patient presents with  . Dysuria    HPI Shirley [name] is a 50 y.o. female.   50 year old female who presents to the urgent care for evaluation of the above issues.  She reports burning on urination, increased frequency, increased urgency.  No hematuria noted.  She also reports some occasional flank pain.  No fever shakes chills.  Scheduled to see her primary care provider Dr. Ronnald Ramp but not until May 31.  Patient reports some nausea but no real abdominal pain.  No vomiting or diarrhea.  She also noted a color change in her urine might be a little bit darker.  Patient also reports a history of kidney stones in the past.  She denies any Covid symptoms or Covid exposure.  No chest pain or shortness of breath.  No red flag signs or symptoms elicited on history.     Past Medical History:  Diagnosis Date  . Abnormal Pap smear of cervix   . ADHD (attention deficit hyperactivity disorder)   . Anxiety   . Mitral valve prolapse   . Spina bifida occulta   . UTI (urinary tract infection)     Patient Active Problem List   Diagnosis Date Noted  . Perimenopause 09/19/2016  . Family history of ovarian cancer 09/19/2016  . Family history of colon cancer 09/19/2016  . Anxiety 09/19/2016  . Increased BMI 09/19/2016  . Cyst of right ovary 09/19/2016  . Abnormal uterine bleeding (AUB) 09/19/2016  . Frequency of urination 09/19/2016  . Status post LEEP (loop electrosurgical excision procedure) of cervix 09/19/2016    Past Surgical History:  Procedure Laterality Date  . LEEP    . 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 Authorizing Provider  acyclovir (ZOVIRAX) 400 MG tablet Take 400 mg by mouth every 8 (eight) hours. 09/05/20  Yes [provider]  albuterol (PROVENTIL HFA;VENTOLIN HFA) 108 (90 Base) MCG/ACT inhaler Inhale into the lungs every 6 (six) hours as needed for wheezing or shortness of breath.   Yes [provider]  ALPRAZolam Duanne Moron) 1 MG tablet Take 1 mg by mouth 3 (three) times daily as needed for anxiety.   Yes [provider]  cetirizine (ZYRTEC) 10 MG tablet Take 10 mg by mouth daily.   Yes [provider]  Cholecalciferol 1.25 MG (50000 UT) capsule TK 1 C PO ONE TIME PER WEEK 10/30/18  Yes [provider]  estrogen, conjugated,-medroxyprogesterone (PREMPRO) 0.45-1.5 MG tablet Take 1 tablet by mouth daily. 09/08/20  Yes Rubie Maid, MD  fenofibrate (TRICOR) 145 MG tablet Take 145 mg by mouth daily. 08/31/20  Yes [provider]  fluticasone (FLONASE) 50 MCG/ACT  nasal spray Place 2 sprays into both nostrils daily. 07/04/17  Yes Melynda Ripple, MD  fluticasone (FLOVENT HFA) 220 MCG/ACT inhaler Inhale into the lungs 2 (two) times daily.   Yes [provider]  hydrochlorothiazide (HYDRODIURIL) 12.5 MG tablet Take 12.5 mg by mouth daily. 06/04/20  Yes [provider]  lisinopril (PRINIVIL,ZESTRIL) 10 MG tablet Take 10 mg by mouth daily.   Yes [provider]  meloxicam (MOBIC) 15 MG tablet Take 15 mg by mouth daily. 06/18/20  Yes [provider]  metoprolol succinate (TOPROL-XL) 100 MG 24 hr tablet Take 100 mg by mouth daily. 08/11/20  Yes [provider]  fosfomycin (MONUROL) 3 g PACK TAKE 3 G BY MOUTH ONCE FOR 1 DOSE. 10/16/20   Rubie Maid, MD  HYDROcodone-acetaminophen (NORCO/VICODIN) 5-325 MG tablet Take 1 tablet by mouth every 6 (six) hours as needed for moderate  pain. Patient not taking: Reported on 09/05/2020 11/19/18   Sherrie George B, FNP  traZODone (DESYREL) 100 MG tablet Take 100 mg by mouth at bedtime. Patient not taking: No sig reported    [provider]    Family History Family History  Problem Relation Age of Onset  . Cancer Mother   . Cancer Father     Social History Social History   Tobacco Use  . Smoking status: Never Smoker  . Smokeless tobacco: Never Used  Vaping Use  . Vaping Use: Never used  Substance Use Topics  . Alcohol use: No  . Drug use: No     Allergies   Bactrim [sulfamethoxazole-trimethoprim], Gadolinium derivatives, Iodinated diagnostic agents, Other, Sulfa antibiotics, Azithromycin, Clarithromycin, Furosemide, Guaifenesin, Macrobid [nitrofurantoin monohyd macro], Nitrofurantoin, Penicillins, Amoxicillin, Doxycycline, Keflex [cephalexin], and Levofloxacin   Review of Systems Review of Systems  Constitutional: Negative for chills, diaphoresis, fatigue and fever.  HENT: Positive for postnasal drip. Negative for congestion.   Eyes: Negative.  Negative for pain.  Respiratory: Negative.  Negative for cough, chest tightness, shortness of breath, wheezing and stridor.   Cardiovascular: Negative.  Negative for chest pain and palpitations.  Gastrointestinal: Positive for abdominal pain and nausea. Negative for constipation, diarrhea and vomiting.  Genitourinary: Positive for dysuria, flank pain and frequency. Negative for hematuria, menstrual problem, pelvic pain, urgency, vaginal bleeding, vaginal discharge and vaginal pain.  Musculoskeletal: Negative for arthralgias, back pain, gait problem, joint swelling and myalgias.  Neurological: Negative.  Negative for dizziness, syncope, weakness, numbness and headaches.     Physical Exam Triage Vital Signs ED Triage Vitals  Enc Vitals Group     BP 10/16/20 1612 120/79     Pulse Rate 10/16/20 1612 92     Resp 10/16/20 1612 18     Temp 10/16/20 1612 98.3 F  (36.8 C)     Temp Source 10/16/20 1612 Oral     SpO2 10/16/20 1612 98 %     Weight 10/16/20 1608 190 lb (86.2 kg)     Height 10/16/20 1608 5\' 6"  (1.676 m)     Head Circumference --      Peak Flow --      Pain Score 10/16/20 1608 10     Pain Loc --      Pain Edu? --      Excl. in [place]? --    No data found.  Updated Vital Signs BP 120/79 (BP Location: Left Arm)   Pulse 92   Temp 98.3 F (36.8 C) (Oral)   Resp 18   Ht 5\' 6"  (1.676 m)   Wt  86.2 kg   SpO2 98%   BMI 30.67 kg/m   Visual Acuity Right Eye Distance:   Left Eye Distance:   Bilateral Distance:    Right Eye Near:   Left Eye Near:    Bilateral Near:     Physical Exam Vitals and nursing note reviewed.  Constitutional:      General: She is not in acute distress.    Appearance: Normal appearance. She is not ill-appearing, toxic-appearing or diaphoretic.  HENT:     Head: Normocephalic and atraumatic.     Nose: No congestion or rhinorrhea.     Mouth/Throat:     Mouth: Mucous membranes are moist.  Eyes:     Extraocular Movements: Extraocular movements intact.     Pupils: Pupils are equal, round, and reactive to light.  Cardiovascular:     Rate and Rhythm: Normal rate and regular rhythm.     Pulses: Normal pulses.     Heart sounds: Normal heart sounds. No murmur heard. No friction rub. No gallop.   Pulmonary:     Effort: Pulmonary effort is normal. No respiratory distress.     Breath sounds: Normal breath sounds. No stridor. No wheezing, rhonchi or rales.  Abdominal:     General: Bowel sounds are normal. There is no distension.     Palpations: Abdomen is soft.     Tenderness: There is abdominal tenderness. There is no right CVA tenderness, left CVA tenderness, guarding or rebound.     Comments: Very minimal tenderness in the suprapubic region.  Musculoskeletal:     Cervical back: Normal range of motion and neck supple. No rigidity or tenderness.  Lymphadenopathy:     Cervical: Cervical adenopathy present.   Skin:    General: Skin is warm and dry.     Capillary Refill: Capillary refill takes less than 2 seconds.     Findings: No erythema, lesion or rash.  Neurological:     Mental Status: She is alert.      UC Treatments / Results  Labs (all labs ordered are listed, but only abnormal results are displayed) Labs Reviewed  WET PREP, GENITAL - Abnormal; Notable for the following components:      Result Value   WBC, Wet Prep HPF POC MANY (*)    All other components within normal limits  URINALYSIS, COMPLETE (UACMP) WITH MICROSCOPIC - Abnormal; Notable for the following components:   Hgb urine dipstick SMALL (*)    All other components within normal limits    EKG   Radiology No results found.  Procedures Procedures (including critical care time)  Medications Ordered in UC Medications - No data to display  Initial Impression / Assessment and Plan / UC Course  I have reviewed the triage vital signs and the nursing notes.  Pertinent labs & imaging results that were available during my care of the patient were reviewed by me and considered in my medical decision making (see chart for details).  Clinical impression: Dysuria with some nausea without fever shakes chills, no vomiting or diarrhea.  Also some mild suprapubic tenderness without any swelling or distention.  Treatment plan: 1.  The findings and treatment plan were discussed in detail with the patient.  Patient was in agreement. 2.  I recommended getting a UA.  Results are above.  Patient has trace hemoglobin but no evidence of UTI. 3.  Also did a wet prep and the results are above.  No trichomonas or clue cells noted.  Patient did have many  WBCs.  Unsure of the source.  I have recommended urology follow-up.  I placed the consult and gave the patient information to call directly as well. 4.  Educational handouts provided. 5.  Encourage her to drink plenty of fluids to flush her system.  Tylenol or Motrin for any fever or  discomfort. 6.  If symptoms persist and she cannot get into urology she should see her primary care provider.  Certainly if they are worse she should go to the emergency room. 7.  Follow-up here as needed.    Final Clinical Impressions(s) / UC Diagnoses   Final diagnoses:  Dysuria  Acute cystitis without hematuria  Urinary frequency  Urinary urgency     Discharge Instructions     Your urine does not show that you have a urinary tract infection. Your wet prep does not show you have trichomonas, or BV.  However there are white cells in your wet prep. I recommend that you call urology as you are having urological symptoms and I do not have a source to treat. I have made the referral but I have also given you the information to call directly to get an appointment.  I hope you get the feeling better, Dr. Drema Dallas    ED Prescriptions    None     PDMP not reviewed this encounter.   Verda Cumins, MD 10/23/20 1414

## 2020-10-24 ENCOUNTER — Ambulatory Visit (INDEPENDENT_AMBULATORY_CARE_PROVIDER_SITE_OTHER): Payer: Medicaid Other | Admitting: Obstetrics and Gynecology

## 2020-10-24 ENCOUNTER — Encounter: Payer: Self-pay | Admitting: Obstetrics and Gynecology

## 2020-10-24 ENCOUNTER — Other Ambulatory Visit: Payer: Self-pay

## 2020-10-24 VITALS — BP 113/76 | HR 91 | Ht 66.0 in | Wt 187.6 lb

## 2020-10-24 DIAGNOSIS — Z780 Asymptomatic menopausal state: Secondary | ICD-10-CM | POA: Diagnosis not present

## 2020-10-24 DIAGNOSIS — Z8742 Personal history of other diseases of the female genital tract: Secondary | ICD-10-CM | POA: Diagnosis not present

## 2020-10-24 DIAGNOSIS — N3090 Cystitis, unspecified without hematuria: Secondary | ICD-10-CM

## 2020-10-24 DIAGNOSIS — N952 Postmenopausal atrophic vaginitis: Secondary | ICD-10-CM

## 2020-10-24 DIAGNOSIS — R4586 Emotional lability: Secondary | ICD-10-CM

## 2020-10-24 DIAGNOSIS — E669 Obesity, unspecified: Secondary | ICD-10-CM | POA: Diagnosis not present

## 2020-10-24 DIAGNOSIS — R399 Unspecified symptoms and signs involving the genitourinary system: Secondary | ICD-10-CM

## 2020-10-24 LAB — POCT URINALYSIS DIPSTICK
Bilirubin, UA: NEGATIVE
Glucose, UA: NEGATIVE
Ketones, UA: NEGATIVE
Leukocytes, UA: NEGATIVE
Nitrite, UA: NEGATIVE
Protein, UA: NEGATIVE
Spec Grav, UA: 1.01
Urobilinogen, UA: 0.2 U/dL
pH, UA: 6
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
## 2020-10-24 NOTE — Progress Notes (Signed)
GYN Pt- stated having hot flashes, unable to sleep at night and concerned about her ovarian cyst. UA completed and documented and urine culture completed.

## 2020-10-25 DIAGNOSIS — R399 Unspecified symptoms and signs involving the genitourinary system: Secondary | ICD-10-CM | POA: Diagnosis not present

## 2020-10-27 LAB — URINE CULTURE: Organism ID, Bacteria: NO GROWTH

## 2020-10-28 ENCOUNTER — Encounter: Payer: Self-pay | Admitting: Obstetrics and Gynecology

## 2020-10-29 ENCOUNTER — Telehealth: Payer: Self-pay | Admitting: Obstetrics and Gynecology

## 2020-10-29 ENCOUNTER — Other Ambulatory Visit: Payer: Self-pay | Admitting: Obstetrics and Gynecology

## 2020-10-30 NOTE — Telephone Encounter (Signed)
Pt called and was wanting to know her results from her urine culture.  She states she feels she has a UTI

## 2020-10-31 ENCOUNTER — Other Ambulatory Visit: Payer: Self-pay

## 2020-10-31 NOTE — Telephone Encounter (Signed)
Spoke to pt. Pt stated that she was informed by the pharamcy that she needed to take 2 doses of the medication Monurol 3g. Pt stated that she is still having uti symptoms and is concerned due to her last visit at urgent care they were concerned about white blood cells in her urine. Pt was informed that her urine sample in our office were negative. Pt stated that she has an appt with urology but it was in 4 weeks. Pt is requesting a refill of the medication until she is seen at urology.  Please advise. Luana Shu

## 2020-10-31 NOTE — Telephone Encounter (Signed)
Pt called stating that the pharmacy informed her that she needed to take two doses of the medication Monurol 3g and was wondering if she could get another refill. Pt stated that she was having UTI symptoms. Pt stated that her appt with urology wasn't until 4 weeks from now and would need something until then.

## 2020-10-31 NOTE — Telephone Encounter (Signed)
Pleease let patient know that she doesn't need a prescription for antibiotic as there is nothing to treat (her recent urine and culture are both negative) This is why she does not need another round of that medicine.  She did not have any WBCs in her urine. These were on her wet prep (vaginal swab) but there was no infection there either. The white blood cells can be seen due to inflammation from vaginal surfaces in menopause as well which is a normal finding.

## 2020-10-31 NOTE — Telephone Encounter (Signed)
Spoke to pt and informed her of the information given by [facility]. Pt stated that she would go to the ED to be checked because she is concerned about what the doctor at Urgent Care seen. Pt stated that she would be going to the ED today.

## 2020-10-31 NOTE — Telephone Encounter (Signed)
Spoke

## 2020-11-01 ENCOUNTER — Ambulatory Visit: Payer: Self-pay | Admitting: *Deleted

## 2020-11-01 NOTE — Telephone Encounter (Signed)
Pt reports 4 fire ant bites on left foot, occurred yesterday. States had reaction yeas ago when bitten "Over 100 times." States smaller bites are itchy, "One large bite with red ring and pustule." States top of foot swollen, able to wear shoes. Reports "Just a little tightness in my throat." States largest bite is dime size. Has taken benadryl yesterday, not today. Also reports nausea. States top of foot red and warm. Advised UC. Pt states will follow disposition "But may wait until tomorrow." Advised ED if symptoms worsen.   Reason for Disposition . [1] Red or very tender (to touch) area AND [2] started over 24 hours after the sting  Answer Assessment - Initial Assessment Questions 1. SEVERITY: "How many stings are there?"     5 2. ONSET: "When did it occur?"      yesterday 3. LOCATION: "Where is the sting located?"  "How many stings?"     Left foot 4. SWELLING: "How big is the swelling?" (e.g., inches or cm)     Top of foot swollen 5. REDNESS: "Is the area red or pink?" If Yes, ask: "What size is the area of redness?" (e.g., inches or cm). "When did the redness start?"     Red and warmth 6. PAIN: "Is there any pain?" If Yes, ask: "How bad is it?"  (Scale 1-10; or mild, moderate, severe)     9/10 7. ITCHING: "Is there any itching?" If Yes, ask: "How bad is it?"      Burning. 2 smaller ones itch 8. RESPIRATORY DISTRESS: "Describe your breathing."     Some yesterday 9. PRIOR REACTIONS: "Have you had any severe allergic reactions to stings in the past?" If yes, ask: "What happened?"    Throat swelling, fatigued 10. OTHER SYMPTOMS: "Do you have any other symptoms?" (e.g., abdominal pain, face or tongue swelling, new rash elsewhere, vomiting)       Nausea, tightness of throat  Protocols used: FIRE ANT STING-A-AH

## 2020-11-06 ENCOUNTER — Other Ambulatory Visit: Payer: Self-pay | Admitting: Obstetrics and Gynecology

## 2020-11-08 ENCOUNTER — Telehealth: Payer: Self-pay | Admitting: Obstetrics and Gynecology

## 2020-11-08 MED ORDER — TAMSULOSIN HCL 0.4 MG PO CAPS: 0.4000 mg | ORAL_CAPSULE | Freq: Every day | ORAL | 0 refills | Status: DC

## 2020-11-08 NOTE — Telephone Encounter (Signed)
Patient contacted the office regarding refill of prescription of Monurol due to her UTI symptoms.  Has been sending refill requests for the past week. Notes that she usually takes 2 rounds when she has a UTI. I explained to patient again that she did not have a UTI based on recent UA and cultures performed. Notes that she is having irritative voiding symptoms, back pain, urinary frequency, and leg swelling and abdominal bloating.  Also with blood in her urine. Is also currently taking Azo which she notes is not helping. Reminded patient that she has had blood in her urine for several years, and that this was not a new finding..  Patient notes she does not want to go into the ER or urgent care as her pharmacist stated that if she had a PCP they should be prescribing her antibiotics.  Reiterated that I would not prescribe antibiotics as she did not have an active infection. Patient has a referral placed to Urology however appointment is still several weeks away. She does have a history of kidney stones. Offered trial of Flomax to see if this will help her symptoms. Will also see if Urology referral can be moved up.     Dr. Marcelline Mates

## 2020-11-08 NOTE — Telephone Encounter (Signed)
Dr. Marcelline Mates spoke to the pt on 11/08/2020 at 5:00pm concerning her issues.

## 2020-11-08 NOTE — Telephone Encounter (Signed)
New Message:  Pt states she has a UTI. Urgent Care told her her urine had a lot of white blood cells. She cant get in with a Urologist for several weeks.

## 2020-11-08 NOTE — Telephone Encounter (Signed)
Please advised. Pt called again today 11/08/2020 stating that she was seen at Urgent Care and they stated that she had a lot of white blood cells in her urine and when not be able to be treated at Urology for several weeks.

## 2020-11-08 NOTE — Telephone Encounter (Signed)
She does not have white blood cells in her urine. She does not have an infection in her bladder. If she had that, then they would have gone ahead and treated her for a UTI.

## 2020-11-09 ENCOUNTER — Ambulatory Visit
Admission: EM | Admit: 2020-11-09 | Discharge: 2020-11-09 | Disposition: A | Discharge status: Home / Self Care | Payer: Medicaid Other | Attending: Sports Medicine | Admitting: Sports Medicine

## 2020-11-09 ENCOUNTER — Encounter: Payer: Self-pay | Admitting: Emergency Medicine

## 2020-11-09 ENCOUNTER — Other Ambulatory Visit: Payer: Self-pay

## 2020-11-09 DIAGNOSIS — N3090 Cystitis, unspecified without hematuria: Secondary | ICD-10-CM | POA: Diagnosis not present

## 2020-11-09 DIAGNOSIS — R350 Frequency of micturition: Secondary | ICD-10-CM | POA: Insufficient documentation

## 2020-11-09 LAB — URINALYSIS, COMPLETE (UACMP) WITH MICROSCOPIC
Bacteria, UA: NONE SEEN
Bilirubin Urine: NEGATIVE
Glucose, UA: NEGATIVE mg/dL
Ketones, ur: NEGATIVE mg/dL
Leukocytes,Ua: NEGATIVE
Nitrite: NEGATIVE
Protein, ur: NEGATIVE mg/dL
Specific Gravity, Urine: <1.005 — ABNORMAL LOW (ref 1.005–1.030)
pH: 6 (ref 5.0–8.0)

## 2020-11-09 NOTE — Discharge Instructions (Addendum)
Your urine test does not show that you have a urinary tract infection. There is no bacteria, leukocytes, nitrites, or any other indication of an infection.  Your specific gravity is low, and is less than 1.005.  This indicates that you are very well-hydrated.  As I discussed with you almost 4 weeks ago in the office you need to follow-up with urology if you are having the symptoms.  There is no infection to treat today.

## 2020-11-09 NOTE — ED Triage Notes (Signed)
Pt c/o dysuria, lower back pain, urinary frequency. Started about 2 weeks ago. Denies fever. She states she has not eaten or slept well in the last couple of days. She has h/o UTIs

## 2020-11-09 NOTE — ED Provider Notes (Signed)
[facility]    CSN: [id number] Arrival date & time: 11/09/20  1529      History   Chief Complaint Chief Complaint  Patient presents with  . Urinary Frequency  . Dysuria    HPI Shirley [name] is a 50 y.o. female.   Patient is a 50 year old female who presents for evaluation of the above issue.  I saw the patient back on October 16, 2020 for similar symptoms.  At that time the UA was negative for urinary tract infection.  The wet prep did show white blood cells.  There was no active infection.  No trichomonas or BV.  No treatment was initiated.  I recommended urology follow-up.  Patient reports that she has not gotten into see urology.  I have reviewed the chart in detail.  She ended up seeing OB/GYN on October 24, 2020.  UA once again did not show a urinary tract infection.  Subsequent urine culture was also negative.  Patient reports that she has had 40 urinary tract infections and that she is convinced she has an infection currently.  She is complaining of dysuria, increased frequency, she said that she did not sleep last night because she is urinating all the time.  She is drinking plenty of water to flush her system.  She denies any fever shakes chills.  She is also complaining of some low back pain but she points to the lumbar spine not to the flanks.  She also has a little bit of tenderness noted in the suprapubic area.  She denies any vaginal discharge or bleeding.  Please see my previous note for full details of the office visit 24 days ago.  No red flag signs or symptoms elicited on history.     Past Medical History:  Diagnosis Date  . Abnormal Pap smear of cervix   . ADHD (attention deficit hyperactivity disorder)   . Anxiety   . Mitral valve prolapse   . Spina bifida occulta   . UTI (urinary tract infection)     Patient Active Problem List   Diagnosis Date Noted  . Perimenopause 09/19/2016  . Family history of ovarian cancer 09/19/2016  . Family history of  colon cancer 09/19/2016  . Anxiety 09/19/2016  . Increased BMI 09/19/2016  . Cyst of right ovary 09/19/2016  . Abnormal uterine bleeding (AUB) 09/19/2016  . Frequency of urination 09/19/2016  . Status post LEEP (loop electrosurgical excision procedure) of cervix 09/19/2016    Past Surgical History:  Procedure Laterality Date  . LEEP    . 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 Authorizing Provider  acyclovir (ZOVIRAX) 400 MG tablet Take 400 mg by mouth every 8 (eight) hours. 09/05/20  Yes [provider]  albuterol (PROVENTIL HFA;VENTOLIN HFA) 108 (90 Base) MCG/ACT inhaler Inhale into the  lungs every 6 (six) hours as needed for wheezing or shortness of breath.   Yes [provider]  ALPRAZolam Duanne Moron) 1 MG tablet Take 1 mg by mouth 3 (three) times daily as needed for anxiety.   Yes [provider]  cetirizine (ZYRTEC) 10 MG tablet Take 10 mg by mouth daily.   Yes [provider]  Cholecalciferol 1.25 MG (50000 UT) capsule TK 1 C PO ONE TIME PER WEEK 10/30/18  Yes [provider]  fluticasone (FLONASE) 50 MCG/ACT nasal spray Place 2 sprays into both nostrils daily. 07/04/17  Yes Melynda Ripple, MD  hydrochlorothiazide (HYDRODIURIL) 12.5 MG tablet Take 12.5 mg by mouth daily. 06/04/20  Yes [provider]  lisinopril (PRINIVIL,ZESTRIL) 10 MG tablet Take 10 mg by mouth daily.   Yes [provider]  estrogen, conjugated,-medroxyprogesterone (PREMPRO) 0.45-1.5 MG tablet Take 1 tablet by mouth daily. 09/08/20   Rubie Maid, MD  fenofibrate (TRICOR) 145 MG tablet  Take 145 mg by mouth daily. 08/31/20   [provider]  fluticasone (FLOVENT HFA) 220 MCG/ACT inhaler Inhale into the lungs 2 (two) times daily.    [provider]  fosfomycin (MONUROL) 3 g PACK TAKE 3 G BY MOUTH ONCE FOR 1 DOSE. 10/16/20   Rubie Maid, MD  meloxicam (MOBIC) 15 MG tablet Take 15 mg by mouth daily. Patient not taking: Reported on 10/24/2020 06/18/20   [provider]  metoprolol succinate (TOPROL-XL) 100 MG 24 hr tablet Take 100 mg by mouth daily. 08/11/20   [provider]  tamsulosin (FLOMAX) 0.4 MG CAPS capsule Take 1 capsule (0.4 mg total) by mouth daily after supper. 11/08/20   Rubie Maid, MD    Family History Family History  Problem Relation Age of Onset  . Cancer Mother   . Cancer Father     Social History Social History   Tobacco Use  . Smoking status: Never Smoker  . Smokeless tobacco: Never Used  Vaping Use  . Vaping Use: Never used  Substance Use Topics  . Alcohol use: No  . Drug use: No     Allergies   Bactrim [sulfamethoxazole-trimethoprim], Gadolinium derivatives, Iodinated diagnostic agents, Other, Sulfa antibiotics, Azithromycin, Clarithromycin, Furosemide, Guaifenesin, Macrobid [nitrofurantoin monohyd macro], Nitrofurantoin, Penicillins, Amoxicillin, Doxycycline, Keflex [cephalexin], and Levofloxacin   Review of Systems Review of Systems  Constitutional: Negative.  Negative for activity change, appetite change, chills, diaphoresis, fatigue and fever.  HENT: Negative for congestion, sinus pressure, sinus pain and sore throat.   Eyes: Negative.  Negative for photophobia, pain and visual disturbance.  Respiratory: Negative.  Negative for cough, choking, shortness of breath and wheezing.   Cardiovascular: Negative.  Negative for chest pain and palpitations.  Gastrointestinal: Positive for abdominal pain. Negative for constipation, diarrhea, nausea and vomiting.  Genitourinary: Positive for dysuria, flank pain,  frequency and urgency. Negative for genital sores, hematuria, menstrual problem, pelvic pain, vaginal bleeding, vaginal discharge and vaginal pain.  Musculoskeletal: Negative for arthralgias and myalgias.  Skin: Negative.  Negative for color change, pallor, rash and wound.  Neurological: Negative.  Negative for dizziness, seizures, syncope, speech difficulty, light-headedness and headaches.  All other systems reviewed and are negative.    Physical Exam Triage Vital Signs ED Triage Vitals  Enc Vitals Group     BP 11/09/20 1558 120/81     Pulse Rate 11/09/20 1558 79     Resp 11/09/20 1558 18     Temp 11/09/20 1558 98 F (36.7 C)     Temp Source 11/09/20  1558 Oral     SpO2 11/09/20 1558 99 %     Weight 11/09/20 1554 187 lb 9.8 oz (85.1 kg)     Height 11/09/20 1554 5\' 6"  (1.676 m)     Head Circumference --      Peak Flow --      Pain Score 11/09/20 1553 10     Pain Loc --      Pain Edu? --      Excl. in [place]? --    No data found.  Updated Vital Signs BP 120/81 (BP Location: Left Arm)   Pulse 79   Temp 98 F (36.7 C) (Oral)   Resp 18   Ht 5\' 6"  (1.676 m)   Wt 85.1 kg   SpO2 99%   BMI 30.28 kg/m   Visual Acuity Right Eye Distance:   Left Eye Distance:   Bilateral Distance:    Right Eye Near:   Left Eye Near:    Bilateral Near:     Physical Exam Vitals and nursing note reviewed.  Constitutional:      General: She is not in acute distress.    Appearance: Normal appearance. She is not ill-appearing, toxic-appearing or diaphoretic.  HENT:     Head: Normocephalic and atraumatic.  Eyes:     Extraocular Movements: Extraocular movements intact.     Pupils: Pupils are equal, round, and reactive to light.  Cardiovascular:     Rate and Rhythm: Normal rate and regular rhythm.     Pulses: Normal pulses.     Heart sounds: Normal heart sounds. No murmur heard. No friction rub. No gallop.   Pulmonary:     Effort: Pulmonary effort is normal. No respiratory distress.      Breath sounds: Normal breath sounds. No stridor. No wheezing, rhonchi or rales.  Abdominal:     General: Bowel sounds are normal. There is no distension. There are no signs of injury.     Palpations: Abdomen is soft. There is no shifting dullness, fluid wave, hepatomegaly or splenomegaly.     Tenderness: There is abdominal tenderness in the suprapubic area. There is no right CVA tenderness, left CVA tenderness, guarding or rebound. Negative signs include Murphy's sign, Rovsing's sign, McBurney's sign and psoas sign.  Skin:    General: Skin is warm and dry.     Capillary Refill: Capillary refill takes less than 2 seconds.     Findings: No bruising, erythema, lesion or rash.  Neurological:     General: No focal deficit present.     Mental Status: She is alert and oriented to person, place, and time.      UC Treatments / Results  Labs (all labs ordered are listed, but only abnormal results are displayed) Labs Reviewed  URINALYSIS, COMPLETE (UACMP) WITH MICROSCOPIC - Abnormal; Notable for the following components:      Result Value   Specific Gravity, Urine <1.005 (*)    Hgb urine dipstick TRACE (*)    All other components within normal limits    EKG   Radiology No results found.  Procedures Procedures (including critical care time)  Medications Ordered in UC Medications - No data to display  Initial Impression / Assessment and Plan / UC Course  I have reviewed the triage vital signs and the nursing notes.  Pertinent labs & imaging results that were available during my care of the patient were reviewed by me and considered in my medical decision making (see chart for details).  Clinical impression:  Dysuria with increased urinary frequency and urgency.  Patient has had similar symptoms here 24 days ago with a negative work-up, and followed up with OB/GYN who also did a UA and urine culture which did not reveal UTI.  Despite this, patient is convinced she has a urinary tract  infection.  Treatment plan: 1.  The findings and treatment plan were discussed in detail with the patient.  Patient was in agreement. 2.  I recommended getting a UA.  Results are above.  Patient has trace hemoglobin but no evidence of UTI.  Her specific gravity was less than 1.005 indicating that she is adequately hydrated. 3. Given her symptoms I once again recommended that she see urology.  She has the information to give them a call.  When I ask her she said that she left a message but has not heard back from them.  I encouraged her to call again.  She may have some underlying interstitial cystitis and would benefit from urology input. 4.  Educational handouts provided. 5.  Encourage her to continue to drink plenty of fluids to flush her system.  Tylenol or Motrin for any fever or discomfort. 6.  If symptoms persist and she cannot get into urology she should see her primary care provider.  Certainly if they are worse she should go to the emergency room. 7.  Follow-up here as needed.    Final Clinical Impressions(s) / UC Diagnoses   Final diagnoses:  Cystitis  Urinary frequency     Discharge Instructions     Your urine test does not show that you have a urinary tract infection. There is no bacteria, leukocytes, nitrites, or any other indication of an infection.  Your specific gravity is low, and is less than 1.005.  This indicates that you are very well-hydrated.  As I discussed with you almost 4 weeks ago in the office you need to follow-up with urology if you are having the symptoms.  There is no infection to treat today.    ED Prescriptions    None     PDMP not reviewed this encounter.   Verda Cumins, MD 11/09/20 1816

## 2020-11-12 ENCOUNTER — Other Ambulatory Visit: Payer: Self-pay | Admitting: Surgical

## 2020-11-12 DIAGNOSIS — N3090 Cystitis, unspecified without hematuria: Secondary | ICD-10-CM

## 2020-11-12 DIAGNOSIS — R350 Frequency of micturition: Secondary | ICD-10-CM

## 2020-11-15 ENCOUNTER — Encounter: Payer: Self-pay | Admitting: Family Medicine

## 2020-11-15 ENCOUNTER — Other Ambulatory Visit: Payer: Self-pay

## 2020-11-15 ENCOUNTER — Ambulatory Visit: Payer: Medicaid Other | Admitting: Family Medicine

## 2020-11-15 VITALS — BP 118/76 | HR 75 | Temp 98.4°F | Ht 66.0 in | Wt 184.0 lb

## 2020-11-15 DIAGNOSIS — R300 Dysuria: Secondary | ICD-10-CM | POA: Diagnosis not present

## 2020-11-15 DIAGNOSIS — R350 Frequency of micturition: Secondary | ICD-10-CM | POA: Diagnosis not present

## 2020-11-15 DIAGNOSIS — N3011 Interstitial cystitis (chronic) with hematuria: Secondary | ICD-10-CM

## 2020-11-15 LAB — POCT URINALYSIS DIPSTICK
Bilirubin, UA: NEGATIVE
Glucose, UA: NEGATIVE
Ketones, UA: NEGATIVE
Leukocytes, UA: NEGATIVE
Nitrite, UA: NEGATIVE
Protein, UA: NEGATIVE
Spec Grav, UA: >=1.03 — AB (ref 1.010–1.025)
Urobilinogen, UA: 0.2 E.U./dL
pH, UA: 6 (ref 5.0–8.0)

## 2020-11-15 MED ORDER — AMITRIPTYLINE HCL 25 MG PO TABS
25.0000 mg | ORAL_TABLET | Freq: Every day | ORAL | 1 refills | Status: DC
Start: 2020-11-15 — End: 2020-11-22

## 2020-11-15 MED ORDER — HYDROXYZINE PAMOATE 25 MG PO CAPS: 25.0000 mg | ORAL_CAPSULE | Freq: Every evening | ORAL | 0 refills | Status: DC | PRN

## 2020-11-15 NOTE — Assessment & Plan Note (Addendum)
Symptoms of frequency in the setting of serial negative urine analyses with comorbid medical history inclusive of prior infections and surgeries, subjective life stressors raises concern for interstitial cystitis.  Her examination reveals a soft abdomen, minimally tender without rebound right inguinal area, she is nontender to percussion at the costovertebral angle bilaterally, is tender at the sacroiliac joints bilaterally, heart lung sounds benign, moist mucous membranes noted.  The concern for interstitial cystitis was reviewed with patient at length as well as treatment strategies.  She does have upcoming visit with urology which I have advised her to keep.  Additionally we will initiate amitriptyline at 25 mg nightly, plan for titration to 75 mg nightly.  Adjunct hydroxyzine 1-2 tablets at night on an as-needed basis.  Urology will take lead on this after follow-up.

## 2020-11-15 NOTE — Progress Notes (Signed)
Primary Care / Sports Medicine Office Visit  Patient Information:  Patient ID: MARRIA [name], female DOB: 1971/06/15 Age: 50 y.o. MRN: [id number]   Shirley [name] is a pleasant 50 y.o. female presenting with the following:  Chief Complaint  Patient presents with  . New Patient (Initial Visit)  . Urinary Tract Infection    Symptoms of bilateral lower back pain, frequency, urgency, nocturia, oliguria, dysuria; wore catheter in 2005 for 6 months after birth of son due to complications; history of being in a wheelchair for a year due to an [place] in 2020; has seen Dr. Bernardo Heater with Urology in the past; ovarian cysts and LEEP procedure 2003; 10/10 pain with urination    Review of Systems  Constitutional: Positive for chills. Negative for fever.  Gastrointestinal: Positive for abdominal distention. Negative for nausea.  Genitourinary: Positive for dysuria, pelvic pain and urgency. Negative for hematuria.   pertinent details above   Patient Active Problem List   Diagnosis Date Noted  . Interstitial cystitis (chronic) with hematuria 11/15/2020  . Perimenopause 09/19/2016  . Family history of ovarian cancer 09/19/2016  . Family history of colon cancer 09/19/2016  . Anxiety 09/19/2016  . Increased BMI 09/19/2016  . Cyst of right ovary 09/19/2016  . Abnormal uterine bleeding (AUB) 09/19/2016  . Frequency of urination 09/19/2016  . Status post LEEP (loop electrosurgical excision procedure) of cervix 09/19/2016   Past Medical History:  Diagnosis Date  . Abnormal Pap smear of cervix   . ADHD (attention deficit hyperactivity disorder)   . Anxiety   . Mitral valve prolapse   . Spina bifida occulta   . UTI (urinary tract infection)    Outpatient Medications Prior to Visit  Medication Sig Dispense Refill  . acyclovir (ZOVIRAX) 400 MG tablet Take 400 mg by mouth every 8 (eight) hours.    Marland Kitchen albuterol (PROVENTIL HFA;VENTOLIN HFA) 108 (90 Base) MCG/ACT inhaler Inhale into the lungs every 6  (six) hours as needed for wheezing or shortness of breath.    . ALPRAZolam (XANAX) 1 MG tablet Take 1 mg by mouth 3 (three) times daily as needed for anxiety.    . cetirizine (ZYRTEC) 10 MG tablet Take 10 mg by mouth daily.    . fenofibrate (TRICOR) 145 MG tablet Take 145 mg by mouth daily.    . fluticasone (FLONASE) 50 MCG/ACT nasal spray Place 2 sprays into both nostrils daily. 16 g 0  . hydrochlorothiazide (HYDRODIURIL) 12.5 MG tablet Take 12.5 mg by mouth daily.    Marland Kitchen lisinopril (PRINIVIL,ZESTRIL) 10 MG tablet Take 10 mg by mouth daily.    . Cholecalciferol 1.25 MG (50000 UT) capsule TK 1 C PO ONE TIME PER WEEK (Patient not taking: Reported on 11/15/2020)    . fosfomycin (MONUROL) 3 g PACK TAKE 3 G BY MOUTH ONCE FOR 1 DOSE. (Patient not taking: Reported on 11/15/2020) 1 each 0  . metoprolol succinate (TOPROL-XL) 100 MG 24 hr tablet Take 100 mg by mouth daily. (Patient not taking: Reported on 11/15/2020)    . tamsulosin (FLOMAX) 0.4 MG CAPS capsule Take 1 capsule (0.4 mg total) by mouth daily after supper. (Patient not taking: Reported on 11/15/2020) 14 capsule 0  . estrogen, conjugated,-medroxyprogesterone (PREMPRO) 0.45-1.5 MG tablet Take 1 tablet by mouth daily. (Patient not taking: Reported on 11/15/2020) 90 tablet 3  . fluticasone (FLOVENT HFA) 220 MCG/ACT inhaler Inhale into the lungs 2 (two) times daily. (Patient not taking: Reported on 11/15/2020)    . meloxicam (MOBIC)  15 MG tablet Take 15 mg by mouth daily. (Patient not taking: Reported on 10/24/2020)     No facility-administered medications prior to visit.    Past Surgical History:  Procedure Laterality Date  . LEEP     Social History   Socioeconomic History  . Marital status: Single    Spouse name: Not on file  . Number of children: 2  . Years of education: Not on file  . Highest education level: Not on file  Occupational History  . Not on file  Tobacco Use  . Smoking status: Never Smoker  . Smokeless tobacco: Never Used   Vaping Use  . Vaping Use: Never used  Substance and Sexual Activity  . Alcohol use: No  . Drug use: No  . Sexual activity: Yes    Birth control/protection: None  Other Topics Concern  . Not on file  Social History Narrative  . Not on file   Social Determinants of Health   Financial Resource Strain: Not on file  Food Insecurity: Not on file  Transportation Needs: Not on file  Physical Activity: Not on file  Stress: Not on file  Social Connections: Not on file  Intimate Partner Violence: Not on file   Family History  Problem Relation Age of Onset  . Ovarian cancer Mother   . Pancreatic cancer Mother   . Diabetes Mellitus II Mother   . Cancer Father   . Diabetes Mellitus II Father   . Coronary artery disease Father   . Anxiety disorder Sister   . Mitral valve prolapse Sister   . Drug abuse Daughter   . Heart attack Sister    Allergies  Allergen Reactions  . Bactrim [Sulfamethoxazole-Trimethoprim] Anaphylaxis  . Gadolinium Anaphylaxis  . Gadolinium Derivatives Anaphylaxis  . Iodinated Diagnostic Agents Anaphylaxis  . Iodine Anaphylaxis  . Other Nausea And Vomiting, Rash and Hives    Uncoded Allergy. Allergen: BEZONZTATE Uncoded Allergy. Allergen: decongestants  . Sulfa Antibiotics Anaphylaxis and Other (See Comments)    Uncoded Allergy. Allergen: decongestants Uncoded Allergy. Allergen: decongestants   . Azithromycin Hives  . Clarithromycin Other (See Comments)  . Furosemide Other (See Comments)  . Guaifenesin Nausea And Vomiting  . Macrobid [Nitrofurantoin Monohyd Macro] Swelling    Tongue swelling, chest tightness   . Nitrofurantoin Hives  . Penicillins Hives  . Amoxicillin Rash  . Doxycycline Rash and Other (See Comments)  . Keflex [Cephalexin] Rash and Other (See Comments)  . Levofloxacin Rash    Vitals:   11/15/20 1332  BP: 118/76  Pulse: 75  Temp: 98.4 F (36.9 C)  SpO2: 97%   Vitals:   11/15/20 1332  Weight: 184 lb (83.5 kg)  Height: 5'  6" (1.676 m)   Body mass index is 29.7 kg/m.  No results found.   Independent interpretation of notes and tests performed by another provider:   None  Procedures performed:   None  Pertinent History, Exam, Impression, and Recommendations:   Interstitial cystitis (chronic) with hematuria Symptoms of frequency in the setting of serial negative urine analyses with comorbid medical history inclusive of prior infections and surgeries, subjective life stressors raises concern for interstitial cystitis.  Her examination reveals a soft abdomen, minimally tender without rebound right inguinal area, she is nontender to percussion at the costovertebral angle bilaterally, is tender at the sacroiliac joints bilaterally, heart lung sounds benign, moist mucous membranes noted.  The concern for interstitial cystitis was reviewed with patient at length as well as treatment strategies.  She does have upcoming visit with urology which I have advised her to keep.  Additionally we will initiate amitriptyline at 25 mg nightly, plan for titration to 75 mg nightly.  Adjunct hydroxyzine 1-2 tablets at night on an as-needed basis.  Urology will take lead on this after follow-up.  Frequency of urination Reassuring UA, concern for interstitial cystitis    Orders & Medications Meds ordered this encounter  Medications  . amitriptyline (ELAVIL) 25 MG tablet    Sig: Take 1 tablet (25 mg total) by mouth at bedtime. Take 1 tablet oral at bedtime x 1 week then 2 tablets oral at bedtime x 1 week then 3 tablets oral at bedtime    Dispense:  45 tablet    Refill:  1  . hydrOXYzine (VISTARIL) 25 MG capsule    Sig: Take 1-2 capsules (25-50 mg total) by mouth at bedtime as needed (bladder pain).    Dispense:  30 capsule    Refill:  0   Orders Placed This Encounter  Procedures  . POCT Urinalysis Dipstick     Return in about 2 months (around 01/15/2021) for Follow-up and physical.     Montel Culver, MD   [place]

## 2020-11-15 NOTE — Assessment & Plan Note (Signed)
Reassuring UA, concern for interstitial cystitis

## 2020-11-15 NOTE — Patient Instructions (Signed)
-   Dose amitryptiline 25 mg tablet nightly and increase by 1 tablet every week until you are taking 75 mg nightly - Continue at this dose (refill on file) - Can dose 1-2 tablets hydroxyzine as-needed for additional bladder pain - Keep follow-up with urologist - Follow-up in 2 months for physical

## 2020-11-20 ENCOUNTER — Other Ambulatory Visit: Payer: Self-pay

## 2020-11-20 ENCOUNTER — Ambulatory Visit: Payer: Self-pay

## 2020-11-20 ENCOUNTER — Emergency Department: Payer: Medicaid Other

## 2020-11-20 ENCOUNTER — Encounter: Payer: Self-pay | Admitting: Emergency Medicine

## 2020-11-20 ENCOUNTER — Emergency Department
Admission: EM | Admit: 2020-11-20 | Discharge: 2020-11-20 | Disposition: A | Discharge status: Home / Self Care | Payer: Medicaid Other | Attending: Emergency Medicine | Admitting: Emergency Medicine

## 2020-11-20 DIAGNOSIS — R300 Dysuria: Secondary | ICD-10-CM | POA: Insufficient documentation

## 2020-11-20 DIAGNOSIS — R3915 Urgency of urination: Secondary | ICD-10-CM | POA: Insufficient documentation

## 2020-11-20 LAB — URINALYSIS, COMPLETE (UACMP) WITH MICROSCOPIC
Bacteria, UA: NONE SEEN
Bilirubin Urine: NEGATIVE
Glucose, UA: NEGATIVE mg/dL
Ketones, ur: NEGATIVE mg/dL
Leukocytes,Ua: NEGATIVE
Nitrite: NEGATIVE
Protein, ur: NEGATIVE mg/dL
Specific Gravity, Urine: 1.002 — ABNORMAL LOW (ref 1.005–1.030)
WBC, UA: NONE SEEN WBC/hpf (ref 0–5)
pH: 6 (ref 5.0–8.0)

## 2020-11-20 MED ORDER — FOSFOMYCIN TROMETHAMINE 3 G PO PACK: 3.0000 g | PACK | Freq: Once | ORAL | 0 refills | Status: AC

## 2020-11-20 NOTE — ED Notes (Signed)
Pt has been provided with discharge instructions. Pt denies any questions or concerns at this time. Pt verbalizes understanding for follow up care and d/c.  VSS.  Pt left department with all belongings.  

## 2020-11-20 NOTE — ED Notes (Signed)
Maintenance being done on computer. Unable to use signature pad

## 2020-11-20 NOTE — Telephone Encounter (Signed)
[facility] contacted office during lunch hours. I spoke to Judson Roch and also Dr Zigmund Daniel and advised patient to go to ER. She has had multiple urine samples as well as urine culture, per Dr Zigmund Daniel, that are negative. One being at the urgent care prior to seeing him. She is suppose to see urology in the near future. However since symptoms of chills and "having to push hard to get anything to come out" persists, she has been advised to go to ER for further work- up/ evaluation. Patient is refusing to go to ER per [facility]. [facility] was told if she is suspecting that patient is septic, she MUST go to ER. Sarah/ [facility] voiced understanding and will talk to patient again.

## 2020-11-20 NOTE — Telephone Encounter (Signed)
Pt c/o: Burning with every void,  A "popping" she thinks is from her meatus and she stated that she has to push to get any urine out. Pt with urinary frequency. Pt stated she saw bright red blood on the toilet paper this am. Pt stated it was not from her vagina.   Chills yesterday.  Sx 3 weeks. Pt stated that she is distended to her lower abdomen area ( I look 6 months pregnant) Not sexually active. Pt c/o flank pain bilaterally.Pt stated she felt  pelvic cramping- feet and hands are swollen- 120/80- before call.  Called office and spoke with  Tara-spoke with Dr Zigmund Daniel who advised ED. Pt rechecked her BP and it was 180/95. Temperature 99.9 after drinking water.  Pt reluctant to go to ED but later stated that she will make arrangements to go to ED.   Reason for Disposition . [1] Unable to urinate (or only a few drops) > 4 hours AND [2] bladder feels very full (e.g., palpable bladder or strong urge to urinate)    Swollen hands/feet/elevated BP/chills/low grade temp/distended  Lower   Mid abdomen  Answer Assessment - Initial Assessment Questions 1. SEVERITY: "How bad is the pain?"  (e.g., Scale 1-10; mild, moderate, or severe)   - MILD (1-3): complains slightly about urination hurting   - MODERATE (4-7): interferes with normal activities     - SEVERE (8-10): excruciating, unwilling or unable to urinate because of the pain      moderate 2. FREQUENCY: "How many times have you had painful urination today?"      All day 3. PATTERN: "Is pain present every time you urinate or just sometimes?"      Present every time 4. ONSET: "When did the painful urination start?"      1 month 5. FEVER: "Do you have a fever?" If Yes, ask: "What is your temperature, how was it measured, and when did it start?"     Chills- no themometer 6. PAST UTI: "Have you had a urine infection before?" If Yes, ask: "When was the last time?" and "What happened that time?"      yes -hospitalized with kidney infection  7. CAUSE:  "What do you think is causing the painful urination?"  (e.g., UTI, scratch, Herpes sore)     UTI 8. OTHER SYMPTOMS: "Do you have any other symptoms?" (e.g., flank pain, vaginal discharge, genital sores, urgency, blood in urine)     Flank pain- no- bright red this am 9. PREGNANCY: "Is there any chance you are pregnant?" "When was your last menstrual period?"     Post menopausal  Protocols used: [place]

## 2020-11-20 NOTE — ED Triage Notes (Addendum)
First Nurse Note:  Has history of frequent UTI, arrives today to have 'kidney's checked'.  States has had low back pain x 3 weeks and also c/o hand and feet swelling and abdominal swelling.  AAOx3.  Skin warm and dry. NAD

## 2020-11-21 ENCOUNTER — Telehealth: Payer: Self-pay

## 2020-11-21 NOTE — Telephone Encounter (Signed)
Transition Care Management Unsuccessful Follow-up Telephone Call  Date of discharge and from where:  11/19/2020 from [facility]  Attempts:  1st Attempt  Reason for unsuccessful TCM follow-up call:  Left voice message

## 2020-11-21 NOTE — Telephone Encounter (Signed)
For your information  

## 2020-11-22 ENCOUNTER — Other Ambulatory Visit: Payer: Self-pay | Admitting: Family Medicine

## 2020-11-22 DIAGNOSIS — N3011 Interstitial cystitis (chronic) with hematuria: Secondary | ICD-10-CM

## 2020-11-22 DIAGNOSIS — R350 Frequency of micturition: Secondary | ICD-10-CM

## 2020-11-22 DIAGNOSIS — R300 Dysuria: Secondary | ICD-10-CM

## 2020-11-22 NOTE — Telephone Encounter (Signed)
   Notes to clinic:  Patient requesting a 90 day supply of medication    Requested Prescriptions  Pending Prescriptions Disp Refills   amitriptyline (ELAVIL) 25 MG tablet [Pharmacy Med Name: AMITRIPTYLINE HCL 25 MG TAB] 270 tablet 1    Sig: TAKE 1 TABLET ORAL AT BEDTIME X 1 WEEK THEN 2 TABLETS ORAL AT BEDTIME X 1 WEEK THEN 3 TABLETS ORAL AT BEDTIME      Psychiatry:  Antidepressants - Heterocyclics (TCAs) Passed - 11/22/2020  2:33 PM      Passed - Valid encounter within last 6 months    Recent Outpatient Visits           1 week ago Frequency of urination   [facility] Montel Culver, MD       Future Appointments             Tomorrow Hollice Espy, MD [facility]   In 1 month Montel Culver, MD [facility], [facility]               hydrOXYzine (VISTARIL) 25 MG capsule [Pharmacy Med Name: HYDROXYZINE PAM 25 MG CAP] 180 capsule 1    Sig: Take 1-2 capsules (25-50 mg total) by mouth at bedtime as needed (bladder pain).      Ear, Nose, and Throat:  Antihistamines Passed - 11/22/2020  2:33 PM      Passed - Valid encounter within last 12 months    Recent Outpatient Visits           1 week ago Frequency of urination   [facility] Montel Culver, MD       Future Appointments             Tomorrow Hollice Espy, MD [place]   In 1 month Zigmund Daniel Earley Abide, MD [facility]

## 2020-11-22 NOTE — Telephone Encounter (Signed)
Noted  

## 2020-11-22 NOTE — Telephone Encounter (Signed)
Patient requesting 90 day supply of prescriptions.  Please advise.

## 2020-11-22 NOTE — Telephone Encounter (Signed)
Transition Care Management Follow-up Telephone Call  Date of discharge and from where: 11/20/2020 from [facility]  How have you been since you were released from the hospital? Pt stated that she is feeling much better.   Any questions or concerns? No  Items Reviewed:  Did the pt receive and understand the discharge instructions provided? Yes   Medications obtained and verified? Yes   Other? No   Any new allergies since your discharge? No   Dietary orders reviewed? n/a  Do you have support at home? Yes   Functional Questionnaire: (I = Independent and D = Dependent) ADLs: I  Bathing/Dressing- I  Meal Prep- I  Eating- I  Maintaining continence- I  Transferring/Ambulation- I  Managing Meds- I   Follow up appointments reviewed:   PCP Hospital f/u appt confirmed? Yes  01/16/2021 with Rosette Reveal, MD  Specialist Hospital f/u appt confirmed? Yes  Hollice Espy, MD on 10/27/2020  Are transportation arrangements needed? No   If their condition worsens, is the pt aware to call PCP or go to the Emergency Dept.? Yes  Was the patient provided with contact information for the PCP's office or ED? Yes  Was to pt encouraged to call back with questions or concerns? Yes

## 2020-11-22 NOTE — Telephone Encounter (Signed)
Modified to 90 day supply without refill

## 2020-11-23 ENCOUNTER — Other Ambulatory Visit: Payer: Self-pay | Admitting: *Deleted

## 2020-11-23 ENCOUNTER — Ambulatory Visit: Payer: Medicaid Other | Admitting: Urology

## 2020-11-23 DIAGNOSIS — R350 Frequency of micturition: Secondary | ICD-10-CM

## 2020-11-25 NOTE — ED Provider Notes (Signed)
[facility] Emergency Department Provider Note   ____________________________________________   Event Date/Time   First MD Initiated Contact with Patient 11/20/20 1713     (approximate)  I have reviewed the triage vital signs and the nursing notes.   HISTORY  Chief Complaint Dysuria    HPI Shirley [name] is a 50 y.o. female with the below stated past medical history who presents for dysuria that been present over the last week.  Patient states that she was seen by her primary care physician and had a negative urinalysis but is still feeling urinary urgency and burning after she pees as well as after she wipes.  Patient denies any abnormal vaginal discharge but does state that this feeling is similar to previous urinary tract infections that she has had in the past.  Patient states that she has had negative urinalysis and has still ended up with a urinary tract infection that has "put her in the ICU".  Patient currently denies any vision changes, tinnitus, difficulty speaking, facial droop, sore throat, chest pain, shortness of breath, abdominal pain, nausea/vomiting/diarrhea, or weakness/numbness/paresthesias in any extremity          Past Medical History:  Diagnosis Date  . Abnormal Pap smear of cervix   . ADHD (attention deficit hyperactivity disorder)   . Anxiety   . Mitral valve prolapse   . Spina bifida occulta   . UTI (urinary tract infection)     Patient Active Problem List   Diagnosis Date Noted  . Interstitial cystitis (chronic) with hematuria 11/15/2020  . Perimenopause 09/19/2016  . Family history of ovarian cancer 09/19/2016  . Family history of colon cancer 09/19/2016  . Anxiety 09/19/2016  . Increased BMI 09/19/2016  . Cyst of right ovary 09/19/2016  . Abnormal uterine bleeding (AUB) 09/19/2016  . Frequency of urination 09/19/2016  . Status post LEEP (loop electrosurgical excision procedure) of cervix 09/19/2016    Past  Surgical History:  Procedure Laterality Date  . LEEP      Prior to Admission medications   Medication Sig Start Date End Date Taking? Authorizing Provider  acyclovir (ZOVIRAX) 400 MG tablet Take 400 mg by mouth every 8 (eight) hours. 09/05/20   [provider]  albuterol (PROVENTIL HFA;VENTOLIN HFA) 108 (90 Base) MCG/ACT inhaler Inhale into the lungs every 6 (six) hours as needed for wheezing or shortness of breath.    [provider]  ALPRAZolam Duanne Moron) 1 MG tablet Take 1 mg by mouth 3 (three) times daily as needed for anxiety.    [provider]  amitriptyline (ELAVIL) 25 MG tablet TAKE 1 TABLET ORAL AT BEDTIME X 1 WEEK THEN 2 TABLETS ORAL AT BEDTIME X 1 WEEK THEN 3 TABLETS ORAL AT BEDTIME 11/22/20   Montel Culver, MD  cetirizine (ZYRTEC) 10 MG tablet Take 10 mg by mouth daily.    [provider]  Cholecalciferol 1.25 MG (50000 UT) capsule TK 1 C PO ONE TIME PER WEEK Patient not taking: Reported on 11/15/2020 10/30/18   [provider]  fenofibrate (TRICOR) 145 MG tablet Take 145 mg by mouth daily. 08/31/20   [provider]  fluticasone (FLONASE) 50 MCG/ACT nasal spray Place 2 sprays into both nostrils daily. 07/04/17   Melynda Ripple, MD  fosfomycin (MONUROL) 3 g PACK TAKE 3 G BY MOUTH ONCE FOR 1 DOSE. Patient not taking: Reported on 11/15/2020 10/16/20   Rubie Maid, MD  hydrochlorothiazide (HYDRODIURIL) 12.5 MG tablet Take 12.5 mg by mouth daily.  06/04/20   [provider]  hydrOXYzine (VISTARIL) 25 MG capsule TAKE 1-2 CAPSULES (25-50 MG TOTAL) BY MOUTH AT BEDTIME AS NEEDED (BLADDER PAIN). 11/22/20   Montel Culver, MD  lisinopril (PRINIVIL,ZESTRIL) 10 MG tablet Take 10 mg by mouth daily.    [provider]  metoprolol succinate (TOPROL-XL) 100 MG 24 hr tablet Take 100 mg by mouth daily. Patient not taking: Reported on 11/15/2020 08/11/20   [provider]  tamsulosin (FLOMAX) 0.4 MG CAPS capsule Take 1 capsule  (0.4 mg total) by mouth daily after supper. Patient not taking: Reported on 11/15/2020 11/08/20   Rubie Maid, MD    Allergies Bactrim [sulfamethoxazole-trimethoprim], Gadolinium, Gadolinium derivatives, Iodinated diagnostic agents, Iodine, Other, Sulfa antibiotics, Azithromycin, Clarithromycin, Furosemide, Guaifenesin, Macrobid [nitrofurantoin monohyd macro], Nitrofurantoin, Penicillins, Amoxicillin, Doxycycline, Keflex [cephalexin], and Levofloxacin  Family History  Problem Relation Age of Onset  . Ovarian cancer Mother   . Pancreatic cancer Mother   . Diabetes Mellitus II Mother   . Cancer Father   . Diabetes Mellitus II Father   . Coronary artery disease Father   . Anxiety disorder Sister   . Mitral valve prolapse Sister   . Drug abuse Daughter   . Heart attack Sister     Social History Social History   Tobacco Use  . Smoking status: Never Smoker  . Smokeless tobacco: Never Used  Vaping Use  . Vaping Use: Never used  Substance Use Topics  . Alcohol use: No  . Drug use: No    Review of Systems Constitutional: No fever/chills Eyes: No visual changes. ENT: No sore throat. Cardiovascular: Denies chest pain. Respiratory: Denies shortness of breath. Gastrointestinal: No abdominal pain.  No nausea, no vomiting.  No diarrhea. Genitourinary: Positive for dysuria. Musculoskeletal: Negative for acute arthralgias Skin: Negative for rash. Neurological: Negative for headaches, weakness/numbness/paresthesias in any extremity Psychiatric: Negative for suicidal ideation/homicidal ideation   ____________________________________________   PHYSICAL EXAM:  VITAL SIGNS: ED Triage Vitals  Enc Vitals Group     BP 11/20/20 1704 (!) 132/91     Pulse Rate 11/20/20 1704 83     Resp 11/20/20 1704 18     Temp 11/20/20 1704 97.8 F (36.6 C)     Temp Source 11/20/20 1704 Oral     SpO2 11/20/20 1704 98 %     Weight 11/20/20 1653 184 lb (83.5 kg)     Height 11/20/20 1653 5\' 6"   (1.676 m)     Head Circumference --      Peak Flow --      Pain Score 11/20/20 1653 10     Pain Loc --      Pain Edu? --      Excl. in [place]? --    Constitutional: Alert and oriented. Well appearing and in no acute distress. Eyes: Conjunctivae are normal. PERRL. Head: Atraumatic. Nose: No congestion/rhinnorhea. Mouth/Throat: Mucous membranes are moist. Neck: No stridor Cardiovascular: Grossly normal heart sounds.  Good peripheral circulation. Respiratory: Normal respiratory effort.  No retractions. Gastrointestinal: Soft and nontender. No distention. Musculoskeletal: No obvious deformities Neurologic:  Normal speech and language. No gross focal neurologic deficits are appreciated. Skin:  Skin is warm and dry. No rash noted. Psychiatric: Mood and affect are normal. Speech and behavior are normal.  ____________________________________________   LABS (all labs ordered are listed, but only abnormal results are displayed)  Labs Reviewed  URINALYSIS, COMPLETE (UACMP) WITH MICROSCOPIC - Abnormal; Notable for the following components:      Result Value  Color, Urine STRAW (*)    APPearance CLEAR (*)    Specific Gravity, Urine 1.002 (*)    Hgb urine dipstick SMALL (*)    All other components within normal limits    PROCEDURES  Procedure(s) performed (including Critical Care):  Procedures   ____________________________________________   INITIAL IMPRESSION / ASSESSMENT AND PLAN / ED COURSE  As part of my medical decision making, I reviewed the following data within the [facility] notes reviewed and incorporated, Labs reviewed, EKG interpreted, Old chart reviewed, Radiograph reviewed and Notes from prior ED visits reviewed and incorporated      Not Pregnant. Unlikely TOA, Ovarian Torsion, PID, gonorrhea/chlamydia. Low suspicion for Infected Urolithiasis, AAA, Cholecystitis, Pancreatitis, SBO, Appendicitis, or other acute abdomen.  Rx:  Fosfomycin Disposition: Discharge home. SRP discussed. Advise follow up with primary care provider within 24-72 hours.      ____________________________________________   FINAL CLINICAL IMPRESSION(S) / ED DIAGNOSES  Final diagnoses:  Dysuria     ED Discharge Orders         Ordered    fosfomycin (MONUROL) 3 g PACK   Once        11/20/20 1828           Note:  This document was prepared using Dragon voice recognition software and may include unintentional dictation errors.   Naaman Plummer, MD 11/25/20 [phone]

## 2020-12-18 DIAGNOSIS — F900 Attention-deficit hyperactivity disorder, predominantly inattentive type: Secondary | ICD-10-CM | POA: Diagnosis not present

## 2020-12-18 DIAGNOSIS — Z79899 Other long term (current) drug therapy: Secondary | ICD-10-CM | POA: Diagnosis not present

## 2020-12-18 DIAGNOSIS — F410 Panic disorder [episodic paroxysmal anxiety] without agoraphobia: Secondary | ICD-10-CM | POA: Diagnosis not present

## 2020-12-25 ENCOUNTER — Ambulatory Visit: Payer: Medicaid Other | Admitting: Family Medicine

## 2020-12-25 ENCOUNTER — Emergency Department
Admission: EM | Admit: 2020-12-25 | Discharge: 2020-12-25 | Discharge status: Against Medical Advice | Payer: No Typology Code available for payment source

## 2020-12-28 ENCOUNTER — Encounter: Payer: Self-pay | Admitting: Emergency Medicine

## 2020-12-28 ENCOUNTER — Ambulatory Visit
Admission: EM | Admit: 2020-12-28 | Discharge: 2020-12-28 | Disposition: A | Discharge status: Home / Self Care | Payer: Medicaid Other | Attending: Emergency Medicine | Admitting: Emergency Medicine

## 2020-12-28 ENCOUNTER — Other Ambulatory Visit: Payer: Self-pay

## 2020-12-28 ENCOUNTER — Ambulatory Visit: Payer: Medicaid Other | Admitting: Family Medicine

## 2020-12-28 ENCOUNTER — Ambulatory Visit: Payer: Self-pay | Admitting: *Deleted

## 2020-12-28 DIAGNOSIS — L03112 Cellulitis of left axilla: Secondary | ICD-10-CM | POA: Insufficient documentation

## 2020-12-28 LAB — URINALYSIS, COMPLETE (UACMP) WITH MICROSCOPIC
Bilirubin Urine: NEGATIVE
Glucose, UA: NEGATIVE mg/dL
Ketones, ur: NEGATIVE mg/dL
Leukocytes,Ua: NEGATIVE
Nitrite: NEGATIVE
Protein, ur: NEGATIVE mg/dL
Specific Gravity, Urine: 1.01 (ref 1.005–1.030)
pH: 6 (ref 5.0–8.0)

## 2020-12-28 MED ORDER — CLINDAMYCIN HCL 150 MG PO CAPS: 300.0000 mg | ORAL_CAPSULE | Freq: Three times a day (TID) | ORAL | 0 refills | Status: AC

## 2020-12-28 NOTE — Discharge Instructions (Addendum)
Take the clindamycin 3 times a day for 7 days for treatment of the cellulitis in your left armpit.  Take a four lactobacillus strain probiotic, or Saccharomyces Boulardii, 1 hour after each dose of antibiotic to prevent diarrhea.  If the area has not improved in 72 hours return for reevaluation.

## 2020-12-28 NOTE — Telephone Encounter (Signed)
Noted.  For your information.   Patient cancelled appointment with Dr. Zigmund Daniel that was scheduled today at 11:20 AM and rescheduled to Monday.

## 2020-12-28 NOTE — Telephone Encounter (Signed)
  Patient is reporting she has swelling under her left arm that is painful and making it hard to raise arm- call to office- provider not in office this afternoon.Advised per protocol- UC/ED for evaluation.  Reason for Disposition . [1] Swelling is painful to touch AND [2] fever  Answer Assessment - Initial Assessment Questions 1. ONSET: "When did the swelling start?" (e.g., minutes, hours, days)     2 days ago 2. LOCATION: "What part of the arm is swollen?"  "Are both arms swollen or just one arm?"     Left arm- pain and swelling under arm 3. SEVERITY: "How bad is the swelling?" (e.g., localized; mild, moderate, severe)   - LOCALIZED: Small area of puffiness or swelling on just one arm   - JOINT SWELLING: Swelling of one joint   - MILD: Puffiness or swelling of hand   - MODERATE: Puffiness or swollen feeling of entire arm    - SEVERE: All of arm looks swollen; pitting edema     Localized swelling under the arm 4. REDNESS: "Does the swelling look red or infected?"     Rash- redness under arm 5. PAIN: "Is the swelling painful to touch?" If Yes, ask: "How painful is it?"   (Scale 1-10; mild, moderate or severe)     Pain-10 6. FEVER: "Do you have a fever?" If Yes, ask: "What is it, how was it measured, and when did it start?"      Not sure 7. CAUSE: "What do you think is causing the arm swelling?"     Not sure 8. MEDICAL HISTORY: "Do you have a history of heart failure, kidney disease, liver failure, or cancer?"     Kidney disease 9. RECURRENT SYMPTOM: "Have you had arm swelling before?" If Yes, ask: "When was the last time?" "What happened that time?"     no 10. OTHER SYMPTOMS: "Do you have any other symptoms?" (e.g., chest pain, difficulty breathing)       Rash- under arm, buttocks 11. PREGNANCY: "Is there any chance you are pregnant?" "When was your last menstrual period?"       n/a  Protocols used: ARM SWELLING AND EDEMA-A-AH

## 2020-12-28 NOTE — ED Provider Notes (Signed)
[facility]    CSN: [id number] Arrival date & time: 12/28/20  1503      History   Chief Complaint Chief Complaint  Patient presents with  . Rash  . Dysuria    HPI Shirley [name] is a 50 y.o. female.   HPI   50 year old female here for evaluation of rash and urinary frequency.  Patient reports that she has had a burning rash in her left armpit for the last week that became more painful today.  She reports it is painful to raise her arm.  Patient denies fever.  She reports that she had chills 2 nights ago but those resolved.  Her urinary frequency is something that is an ongoing issue for her and she denies any pain with urination, or urgency.  Only, patient has a red rash on her left buttock that does not itch or burn.  Past Medical History:  Diagnosis Date  . Abnormal Pap smear of cervix   . ADHD (attention deficit hyperactivity disorder)   . Anxiety   . Mitral valve prolapse   . Spina bifida occulta   . UTI (urinary tract infection)     Patient Active Problem List   Diagnosis Date Noted  . Interstitial cystitis (chronic) with hematuria 11/15/2020  . Perimenopause 09/19/2016  . Family history of ovarian cancer 09/19/2016  . Family history of colon cancer 09/19/2016  . Anxiety 09/19/2016  . Increased BMI 09/19/2016  . Cyst of right ovary 09/19/2016  . Abnormal uterine bleeding (AUB) 09/19/2016  . Frequency of urination 09/19/2016  . Status post LEEP (loop electrosurgical excision procedure) of cervix 09/19/2016    Past Surgical History:  Procedure Laterality Date  . 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 Authorizing Provider  acyclovir (ZOVIRAX) 400 MG tablet Take 400 mg by mouth every 8 (eight) hours. 09/05/20   [provider]  albuterol (PROVENTIL HFA;VENTOLIN HFA) 108 (90 Base) MCG/ACT inhaler Inhale into the lungs every 6 (six) hours as needed for wheezing or shortness of breath.    [provider]  ALPRAZolam Duanne Moron) 1 MG tablet Take 1 mg by mouth 3 (three) times daily as needed for anxiety.    [provider]  amitriptyline (ELAVIL) 25 MG tablet TAKE 1 TABLET ORAL AT BEDTIME X 1 WEEK THEN 2 TABLETS ORAL AT BEDTIME X 1 WEEK THEN 3 TABLETS ORAL AT BEDTIME 11/22/20   Montel Culver, MD  cetirizine (ZYRTEC) 10 MG tablet Take 10 mg by mouth daily.    [provider]  Cholecalciferol 1.25 MG (50000 UT) capsule TK 1 C PO ONE TIME PER WEEK Patient not taking: Reported on 11/15/2020 10/30/18   [provider]  fenofibrate (TRICOR) 145 MG tablet Take 145 mg by mouth daily. 08/31/20   [provider]  fluticasone (  FLONASE) 50 MCG/ACT nasal spray Place 2 sprays into both nostrils daily. 07/04/17   Melynda Ripple, MD  fosfomycin (MONUROL) 3 g PACK TAKE 3 G BY MOUTH ONCE FOR 1 DOSE. Patient not taking: Reported on 11/15/2020 10/16/20   Rubie Maid, MD  hydrochlorothiazide (HYDRODIURIL) 12.5 MG tablet Take 12.5 mg by mouth daily. 06/04/20   [provider]  hydrOXYzine (VISTARIL) 25 MG capsule TAKE 1-2 CAPSULES (25-50 MG TOTAL) BY MOUTH AT BEDTIME AS NEEDED (BLADDER PAIN). 11/22/20   Montel Culver, MD  lisinopril (PRINIVIL,ZESTRIL) 10 MG tablet Take 10 mg by mouth daily.    [provider]  metoprolol succinate (TOPROL-XL) 100 MG 24 hr tablet Take 100 mg by mouth daily. Patient not taking: Reported on 11/15/2020 08/11/20   [provider]  tamsulosin (FLOMAX) 0.4 MG CAPS capsule Take 1 capsule (0.4 mg total) by mouth daily after supper. Patient not taking: Reported on 11/15/2020 11/08/20   Rubie Maid,  MD    Family History Family History  Problem Relation Age of Onset  . Ovarian cancer Mother   . Pancreatic cancer Mother   . Diabetes Mellitus II Mother   . Cancer Father   . Diabetes Mellitus II Father   . Coronary artery disease Father   . Anxiety disorder Sister   . Mitral valve prolapse Sister   . Drug abuse Daughter   . Heart attack Sister     Social History Social History   Tobacco Use  . Smoking status: Never Smoker  . Smokeless tobacco: Never Used  Vaping Use  . Vaping Use: Never used  Substance Use Topics  . Alcohol use: No  . Drug use: No     Allergies   Bactrim [sulfamethoxazole-trimethoprim], Gadolinium, Gadolinium derivatives, Iodinated diagnostic agents, Iodine, Other, Sulfa antibiotics, Azithromycin, Clarithromycin, Furosemide, Guaifenesin, Macrobid [nitrofurantoin monohyd macro], Nitrofurantoin, Penicillins, Amoxicillin, Doxycycline, Keflex [cephalexin], and Levofloxacin   Review of Systems Review of Systems  Constitutional: Negative for activity change and appetite change.  Genitourinary: Positive for frequency. Negative for dysuria, hematuria and urgency.  Skin: Positive for rash.  Hematological: Negative.   Psychiatric/Behavioral: Negative.      Physical Exam Triage Vital Signs ED Triage Vitals  Enc Vitals Group     BP      Pulse      Resp      Temp      Temp src      SpO2      Weight      Height      Head Circumference      Peak Flow      Pain Score      Pain Loc      Pain Edu?      Excl. in [place]?    No data found.  Updated Vital Signs There were no vitals taken for this visit.  Visual Acuity Right Eye Distance:   Left Eye Distance:   Bilateral Distance:    Right Eye Near:   Left Eye Near:    Bilateral Near:     Physical Exam Vitals and nursing note reviewed.  Constitutional:      General: She is not in acute distress.    Appearance: Normal appearance. She is not ill-appearing.  HENT:     Head: Normocephalic and  atraumatic.  Skin:    General: Skin is warm and dry.     Capillary Refill: Capillary refill takes less than 2 seconds.     Findings: Rash present.  Neurological:  General: No focal deficit present.     Mental Status: She is alert and oriented to person, place, and time.  Psychiatric:        Mood and Affect: Mood normal.        Behavior: Behavior normal.        Thought Content: Thought content normal.        Judgment: Judgment normal.      UC Treatments / Results  Labs (all labs ordered are listed, but only abnormal results are displayed) Labs Reviewed  URINALYSIS, COMPLETE (UACMP) WITH MICROSCOPIC    EKG   Radiology No results found.  Procedures Procedures (including critical care time)  Medications Ordered in UC Medications - No data to display  Initial Impression / Assessment and Plan / UC Course  I have reviewed the triage vital signs and the nursing notes.  Pertinent labs & imaging results that were available during my care of the patient were reviewed by me and considered in my medical decision making (see chart for details).   Nontoxic-appearing 50 year old female who presents for evaluation of burning rash in her left axilla, red nonburning rash on her left buttock, and urinary frequency.  She denies any fever, painful urination, or urinary urgency.  She reports that the rash in her left axilla has been there for a week, has not had a greasy coating, and is burning in nature.  She states that it hurts to raise her left arm.  Physical exam reveals a mildly erythematous flat red rash in her left axilla.  There are no vesicles present and does not appear to be shingles.  Patient reports that she has had cellulitis before and started on the same way and she is concerned that it might be cellulitis again.  The area is hot and tender to touch which is consistent with cellulitis.  Patient has multiple antibiotic allergies so we will treat with clindamycin 3 times daily x7  days for cellulitis.Marland Kitchen  UA shows small hemoglobin and few bacteria but is otherwise negative.  No evidence of infection.  Will discharge patient home on clindamycin for treatment of cellulitis.   Final Clinical Impressions(s) / UC Diagnoses   Final diagnoses:  None   Discharge Instructions   None    ED Prescriptions    None     PDMP not reviewed this encounter.   Margarette Canada, NP 12/28/20 [id number]

## 2020-12-28 NOTE — ED Triage Notes (Signed)
Patient c/o red burning rash that started a week ago.  Patient c/o urinary frequency also and wants her urine checked for infection.  Patient reports chills.

## 2020-12-31 ENCOUNTER — Ambulatory Visit: Payer: Medicaid Other | Admitting: Family Medicine

## 2020-12-31 ENCOUNTER — Other Ambulatory Visit: Payer: Self-pay

## 2020-12-31 VITALS — BP 118/74 | HR 84 | Ht 66.0 in | Wt 184.0 lb

## 2020-12-31 DIAGNOSIS — L03317 Cellulitis of buttock: Secondary | ICD-10-CM | POA: Diagnosis not present

## 2020-12-31 DIAGNOSIS — N200 Calculus of kidney: Secondary | ICD-10-CM

## 2020-12-31 DIAGNOSIS — E785 Hyperlipidemia, unspecified: Secondary | ICD-10-CM

## 2020-12-31 DIAGNOSIS — L732 Hidradenitis suppurativa: Secondary | ICD-10-CM

## 2020-12-31 DIAGNOSIS — N3011 Interstitial cystitis (chronic) with hematuria: Secondary | ICD-10-CM | POA: Diagnosis not present

## 2020-12-31 LAB — POCT URINALYSIS DIPSTICK
Bilirubin, UA: NEGATIVE
Glucose, UA: NEGATIVE
Ketones, UA: NEGATIVE
Leukocytes, UA: NEGATIVE
Nitrite, UA: NEGATIVE
Protein, UA: NEGATIVE
Spec Grav, UA: <=1.005 — AB (ref 1.010–1.025)
Urobilinogen, UA: 0.2 E.U./dL
pH, UA: 6 (ref 5.0–8.0)

## 2020-12-31 MED ORDER — AMITRIPTYLINE HCL 25 MG PO TABS: ORAL_TABLET | ORAL | 0 refills | Status: DC

## 2020-12-31 MED ORDER — TAMSULOSIN HCL 0.4 MG PO CAPS: 0.4000 mg | ORAL_CAPSULE | Freq: Every day | ORAL | 0 refills | Status: DC

## 2020-12-31 NOTE — Assessment & Plan Note (Signed)
Risk stratification labs to be obtained.

## 2020-12-31 NOTE — Assessment & Plan Note (Signed)
Patient with stated concern over 2.5-week history of left gluteal rash, and initially pruritic, denies any specific exposures (bug bites, trauma, etc.).  Physical exam reveals roughly 3 cm linear excoriated faintly erythematous rash.  No fluctuance or fluid expressed during palpation, this area is nontender.  Overall clinical findings are most consistent with resolving/nearly resolved cellulitis of the left buttock.  She is on clindamycin for her comorbid hidradenitis suppurativa.  We can follow-up on as-needed basis for this issue.

## 2020-12-31 NOTE — Patient Instructions (Signed)
-   Dose amitryptiline 25 mg tablet nightly and increase by 1 tablet every week until you are taking 75 mg nightly - Continue at this dose (refill on file) - Follow-up with urologist - Start Flomax and take daily until stone passes - Drink plenty of water - Obtain blood work (need to be fasting) - Return in 1 month

## 2020-12-31 NOTE — Assessment & Plan Note (Signed)
Patient with persistent dysuria, positive blood noted on urinalysis done today.  She does have 2 mm stone in the bladder from CT scan performed towards the end of April.  She was prescribed Flomax but has not dosed this, given her subjective concerns and UA findings I have revisited her dosing of Flomax and free water intake.  She is amenable to start Flomax, increase her free water intake, and we will follow-up on this issue in 4 weeks.  She does have a confounding element of her noted concern for interstitial cystitis, a chronic issue that persists.  Physical exam findings today reveal no CVA tenderness, abdomen is soft and nontender, suprapubic area to palpation is benign, normoactive bowel sounds, no organomegaly noted.  She has normoactive bowel sounds.

## 2020-12-31 NOTE — Assessment & Plan Note (Signed)
Clinical history and findings of the left axilla are most consistent with hidradenitis suppurativa, she has been on clindamycin through the urgent care for 3 days and is noting interval improvement.  Unfortunately she continues to shave this area and use topical products.  I have discussed adjunct measures in addition to pharmacotherapy to promote healing and prevent future occurrences.  Once symptoms have resolved she can return to previous regimen.  Her physical exam reveals faint erythematous base localized about the follicular-based, no masses or drainage expressed, no significant lymphadenopathy, minimally tender with deep palpation throughout the axilla on the left.  She is to continue clindamycin course per urgent care, dosing guidelines discussed, if symptoms persist she was advised to contact her office.  If that is the case, continued dosing to be considered.

## 2020-12-31 NOTE — Assessment & Plan Note (Signed)
Chronic condition without significant interval change, she does have risk factors inclusive of prior infections, prior surgeries, subjective life stressors.  She was unable to maintain her follow-up with urology and she did not initiate amitriptyline.  I have advised patient again on the nature of her stated concerns and goal of pharmacotherapy.  She is amenable to restart amitriptyline and titrate accordingly.  I have advised her at length to contact us for any issues with medication adherence so that we can address them accordingly.  She is to follow-up with urology and return for a follow-up with our office in 4 weeks.

## 2020-12-31 NOTE — Progress Notes (Signed)
Primary Care / Sports Medicine Office Visit  Patient Information:  Patient ID: Shirley [name], female DOB: 1970/08/12 Age: 50 y.o. MRN: [id number]   Shirley [name] is a pleasant 50 y.o. female presenting with the following:  Chief Complaint  Patient presents with  . Rash    Left axillary and left buttock, over a week; painful to raise left arm; seen by urgent care 12/28/2020; given clindamycin 3 times daily x7 days for cellulitis; patient states she went to [place] last Tuesday and when she reached into her bag something pricked her finger and it started bleeding and swelling.  Unsure what caused it, but thinks cellulitis is related to that incident; 5/10 pain    Review of Systems pertinent details above   Patient Active Problem List   Diagnosis Date Noted  . Hyperlipidemia 12/31/2020  . Renal stone 12/31/2020  . Hidradenitis suppurativa of left axilla 12/31/2020  . Cellulitis of left buttock 12/31/2020  . Interstitial cystitis (chronic) with hematuria 11/15/2020  . Perimenopause 09/19/2016  . Family history of ovarian cancer 09/19/2016  . Family history of colon cancer 09/19/2016  . Anxiety 09/19/2016  . Increased BMI 09/19/2016  . Cyst of right ovary 09/19/2016  . Abnormal uterine bleeding (AUB) 09/19/2016  . Frequency of urination 09/19/2016  . Status post LEEP (loop electrosurgical excision procedure) of cervix 09/19/2016   Past Medical History:  Diagnosis Date  . Abnormal Pap smear of cervix   . ADHD (attention deficit hyperactivity disorder)   . Anxiety   . Mitral valve prolapse   . Spina bifida occulta   . UTI (urinary tract infection)    Outpatient Encounter Medications as of 12/31/2020  Medication Sig  . acyclovir (ZOVIRAX) 400 MG tablet Take 400 mg by mouth every 8 (eight) hours.  . ALPRAZolam (XANAX) 1 MG tablet Take 1 mg by mouth 3 (three) times daily as needed for anxiety.  Marland Kitchen amphetamine-dextroamphetamine (ADDERALL) 10 MG tablet Take 10 mg by mouth 2  (two) times daily.  . clindamycin (CLEOCIN) 150 MG capsule Take 2 capsules (300 mg total) by mouth 3 (three) times daily for 7 days.  . fluticasone (FLONASE) 50 MCG/ACT nasal spray Place 2 sprays into both nostrils daily.  . fosfomycin (MONUROL) 3 g PACK TAKE 3 G BY MOUTH ONCE FOR 1 DOSE.  . [DISCONTINUED] tamsulosin (FLOMAX) 0.4 MG CAPS capsule Take 1 capsule (0.4 mg total) by mouth daily after supper.  Marland Kitchen albuterol (PROVENTIL HFA;VENTOLIN HFA) 108 (90 Base) MCG/ACT inhaler Inhale into the lungs every 6 (six) hours as needed for wheezing or shortness of breath. (Patient not taking: Reported on 12/31/2020)  . amitriptyline (ELAVIL) 25 MG tablet TAKE 1 TABLET ORAL AT BEDTIME X 1 WEEK THEN 2 TABLETS ORAL AT BEDTIME X 1 WEEK THEN 3 TABLETS ORAL AT BEDTIME  . cetirizine (ZYRTEC) 10 MG tablet Take 10 mg by mouth daily. (Patient not taking: Reported on 12/31/2020)  . Cholecalciferol 1.25 MG (50000 UT) capsule  (Patient not taking: Reported on 12/31/2020)  . fenofibrate (TRICOR) 145 MG tablet Take 145 mg by mouth daily. (Patient not taking: Reported on 12/31/2020)  . hydrochlorothiazide (HYDRODIURIL) 12.5 MG tablet Take 12.5 mg by mouth daily. (Patient not taking: Reported on 12/31/2020)  . hydrOXYzine (VISTARIL) 25 MG capsule TAKE 1-2 CAPSULES (25-50 MG TOTAL) BY MOUTH AT BEDTIME AS NEEDED (BLADDER PAIN). (Patient not taking: Reported on 12/31/2020)  . lisinopril (PRINIVIL,ZESTRIL) 10 MG tablet Take 10 mg by mouth daily. (Patient not taking: Reported on  12/31/2020)  . metoprolol succinate (TOPROL-XL) 100 MG 24 hr tablet Take 100 mg by mouth daily. (Patient not taking: Reported on 12/31/2020)  . tamsulosin (FLOMAX) 0.4 MG CAPS capsule Take 1 capsule (0.4 mg total) by mouth daily after supper.  . [DISCONTINUED] amitriptyline (ELAVIL) 25 MG tablet TAKE 1 TABLET ORAL AT BEDTIME X 1 WEEK THEN 2 TABLETS ORAL AT BEDTIME X 1 WEEK THEN 3 TABLETS ORAL AT BEDTIME (Patient not taking: Reported on 12/31/2020)   No facility-administered  encounter medications on file as of 12/31/2020.   Past Surgical History:  Procedure Laterality Date  . LEEP      Vitals:   12/31/20 1118  BP: 118/74  Pulse: 84  SpO2: 98%   Vitals:   12/31/20 1118  Weight: 184 lb (83.5 kg)  Height: 5\' 6"  (1.676 m)   Body mass index is 29.7 kg/m.  No results found.   CLINICAL DATA:  Lower back pain x3 weeks history of frequent UTIs  EXAM: CT ABDOMEN AND PELVIS WITHOUT CONTRAST  TECHNIQUE: Multidetector CT imaging of the abdomen and pelvis was performed following the standard protocol without IV contrast.  COMPARISON:  CT June 04, 2017  FINDINGS: Lower chest: No acute abnormality.  Hepatobiliary: Unremarkable noncontrast appearance of the hepatic parenchyma gallbladder is decompressed otherwise unremarkable. No biliary ductal dilation.  Pancreas: Within normal limits.  Spleen: Within normal limits.  Adrenals/Urinary Tract: Bilateral adrenal glands are unremarkable. No hydronephrosis. No contour deforming renal masses. No renal or ureteral calculi visualized. 2 mm stone in the bladder on image 72/5.  Stomach/Bowel: Stomach is within normal limits. Appendix is not definitely visualized however there is no pericecal inflammation. No evidence of bowel wall thickening, distention, or inflammatory changes.  Vascular/Lymphatic: No significant vascular findings are present. No enlarged abdominal or pelvic lymph nodes.  Reproductive: Right ovarian cyst measuring 5.7 cm, similar in size to pelvic ultrasound September 12, 2020. Left ovary is unremarkable.  Other: No abdominopelvic ascites.  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 No acute osseous abnormality.  IMPRESSION: 1. 2 mm stone in the bladder. No hydronephrosis. No renal or ureteral calculi visualized 2. Right ovarian cyst measuring 5.7 cm, similar in  size to pelvic ultrasound September 12, 2020. Follow-up by [place] is recommended in 3-6 months. Note: This recommendation does not apply to premenarchal patients and to those with increased risk (genetic, family history, elevated tumor markers or other high-risk factors) of ovarian cancer. Reference: JACR 2020 Feb; 17(2):248-254   Electronically Signed   By: Dahlia Bailiff MD   On: 11/20/2020 18:23  Independent interpretation of notes and tests performed by another provider:   None  Procedures performed:   None  Pertinent History, Exam, Impression, and Recommendations:   Renal stone Patient with persistent dysuria, positive blood noted on urinalysis done today.  She does have 2 mm stone in the bladder from CT scan performed towards the end of April.  She was prescribed Flomax but has not dosed this, given her subjective concerns and UA findings I have revisited her dosing of Flomax and free water intake.  She is amenable to start Flomax, increase her free water intake, and we will follow-up on this issue in 4 weeks.  She does have a confounding element of her noted concern for interstitial cystitis, a chronic issue that persists.  Physical exam findings today reveal no CVA tenderness, abdomen is soft and nontender, suprapubic area to palpation is benign,  normoactive bowel sounds, no organomegaly noted.  She has normoactive bowel sounds.  Interstitial cystitis (chronic) with hematuria Chronic condition without significant interval change, she does have risk factors inclusive of prior infections, prior surgeries, subjective life stressors.  She was unable to maintain her follow-up with urology and she did not initiate amitriptyline.  I have advised patient again on the nature of her stated concerns and goal of pharmacotherapy.  She is amenable to restart amitriptyline and titrate accordingly.  I have advised her at length to contact us for any issues with medication adherence so that we can  address them accordingly.  She is to follow-up with urology and return for a follow-up with our office in 4 weeks.  Hidradenitis suppurativa of left axilla Clinical history and findings of the left axilla are most consistent with hidradenitis suppurativa, she has been on clindamycin through the urgent care for 3 days and is noting interval improvement.  Unfortunately she continues to shave this area and use topical products.  I have discussed adjunct measures in addition to pharmacotherapy to promote healing and prevent future occurrences.  Once symptoms have resolved she can return to previous regimen.  Her physical exam reveals faint erythematous base localized about the follicular-based, no masses or drainage expressed, no significant lymphadenopathy, minimally tender with deep palpation throughout the axilla on the left.  She is to continue clindamycin course per urgent care, dosing guidelines discussed, if symptoms persist she was advised to contact her office.  If that is the case, continued dosing to be considered.  Cellulitis of left buttock Patient with stated concern over 2.5-week history of left gluteal rash, and initially pruritic, denies any specific exposures (bug bites, trauma, etc.).  Physical exam reveals roughly 3 cm linear excoriated faintly erythematous rash.  No fluctuance or fluid expressed during palpation, this area is nontender.  Overall clinical findings are most consistent with resolving/nearly resolved cellulitis of the left buttock.  She is on clindamycin for her comorbid hidradenitis suppurativa.  We can follow-up on as-needed basis for this issue.  Hyperlipidemia Risk stratification labs to be obtained.    Orders & Medications Meds ordered this encounter  Medications  . amitriptyline (ELAVIL) 25 MG tablet    Sig: TAKE 1 TABLET ORAL AT BEDTIME X 1 WEEK THEN 2 TABLETS ORAL AT BEDTIME X 1 WEEK THEN 3 TABLETS ORAL AT BEDTIME    Dispense:  270 tablet    Refill:  0   . tamsulosin (FLOMAX) 0.4 MG CAPS capsule    Sig: Take 1 capsule (0.4 mg total) by mouth daily after supper.    Dispense:  14 capsule    Refill:  0   Orders Placed This Encounter  Procedures  . CBC with Differential/Platelet  . Comprehensive metabolic panel  . Lipid panel  . TSH  . Hemoglobin A1c  . POCT Urinalysis Dipstick     Return in about 4 weeks (around 01/28/2021) for follow-up, need 40 min - 1 hour visit.     Montel Culver, MD   Primary Care Sports Medicine [place]

## 2021-01-07 ENCOUNTER — Telehealth: Payer: Self-pay

## 2021-01-07 ENCOUNTER — Other Ambulatory Visit: Payer: Self-pay | Admitting: Family Medicine

## 2021-01-07 MED ORDER — FLUTICASONE PROPIONATE 50 MCG/ACT NA SUSP: 2.0000 | Freq: Every day | NASAL | 0 refills | Status: DC

## 2021-01-07 MED ORDER — CETIRIZINE HCL 10 MG PO TABS: 10.0000 mg | ORAL_TABLET | Freq: Every day | ORAL | 0 refills | Status: DC

## 2021-01-07 NOTE — Telephone Encounter (Unsigned)
Copied from [place] [id number]. Topic: General - Other >> Jan 07, 2021  8:25 AM Leward Quan A wrote: Reason for CRM: Patient called in to inform dr Zigmund Daniel that she is having itching of the tongue since taking the tamsulosin (FLOMAX) 0.4 MG CAPS capsule say that it is due to the sulfur in the medication. Would like some advise on what to do please call  Ph# [phone]

## 2021-01-07 NOTE — Telephone Encounter (Signed)
Please advise for potential adverse reaction.

## 2021-01-07 NOTE — Telephone Encounter (Signed)
Refill if appropriate.  Please advise. Never ordered by Dr. Zigmund Daniel.

## 2021-01-07 NOTE — Telephone Encounter (Signed)
  Notes to clinic: medications filled by different providers Review for continued use and refill    Requested Prescriptions  Pending Prescriptions Disp Refills   fluticasone (FLONASE) 50 MCG/ACT nasal spray 16 g 0    Sig: Place 2 sprays into both nostrils daily.      Ear, Nose, and Throat: Nasal Preparations - Corticosteroids Passed - 01/07/2021  8:32 AM      Passed - Valid encounter within last 12 months    Recent Outpatient Visits           1 week ago Interstitial cystitis (chronic) with hematuria   [facility] Montel Culver, MD   1 month ago Frequency of urination   [facility] Montel Culver, MD       Future Appointments             In 1 week Zigmund Daniel Earley Abide, MD [place]   In 4 weeks Zigmund Daniel Earley Abide, MD [facility]               cetirizine (ZYRTEC) 10 MG tablet      Sig: Take 1 tablet (10 mg total) by mouth daily.      Ear, Nose, and Throat:  Antihistamines Passed - 01/07/2021  8:32 AM      Passed - Valid encounter within last 12 months    Recent Outpatient Visits           1 week ago Interstitial cystitis (chronic) with hematuria   [facility] Montel Culver, MD   1 month ago Frequency of urination   [facility] Montel Culver, MD       Future Appointments             In 1 week Zigmund Daniel, Earley Abide, MD [facility], [place]   In 4 weeks Zigmund Daniel, Earley Abide, MD [facility], [facility]

## 2021-01-07 NOTE — Telephone Encounter (Signed)
Copied from [place] [id number]. Topic: Quick Communication - Rx Refill/Question >> Jan 07, 2021  8:29 AM Leward Quan A wrote: Medication: cetirizine (ZYRTEC) 10 MG tablet, fluticasone (FLONASE) 50 MCG/ACT nasal spray   Has the patient contacted their pharmacy? Yes.   (Agent: If no, request that the patient contact the pharmacy for the refill.) (Agent: If yes, when and what did the pharmacy advise?)  Preferred Pharmacy (with phone number or street name): [id number] - [place], [address]  Phone:  [phone] Fax:  [phone]     Agent: Please be advised that RX refills may take up to 3 business days. We ask that you follow-up with your pharmacy.

## 2021-01-08 NOTE — Telephone Encounter (Signed)
Left detailed voicemail notifying patient.

## 2021-01-10 ENCOUNTER — Ambulatory Visit: Payer: Self-pay | Admitting: *Deleted

## 2021-01-10 NOTE — Telephone Encounter (Signed)
Just now seeing this message.  Please advise for symptoms.

## 2021-01-10 NOTE — Telephone Encounter (Addendum)
Pt called in c/o right flank pain, burning with urination and abd swelling.   She has a know kidney stone on the right side.   Dr. Zigmund Daniel instructed her to call in if she started having pain.  I have sent a high priority note to Dr. Zigmund Daniel.   Someone will call you back.  I called into office and spoke with Estill Bamberg to be sure they got her message.   She is going to route it to the nurse now and give the pt a call if needed.

## 2021-01-10 NOTE — Telephone Encounter (Signed)
Reason for Disposition  Patient sounds very sick or weak to the triager    Pt has a kidney stone on the right side.   She is experiencing pain, burning with urination and fatigue that is worse this morning.    Dr. Rosette Reveal instructed her to call in if she started having symptoms with the known kidney stone.  Answer Assessment - Initial Assessment Questions 1. LOCATION: "Where does it hurt?" (e.g., left, right)     I was in a couple of weeks ago.    I went to the ED and CT scan showed a kidney stone.    I feel fatigued and dizzy and swollen in abd. 2. ONSET: "When did the pain start?"     Right flank pain and burns when I urinate.   3. SEVERITY: "How bad is the pain?" (e.g., Scale 1-10; mild, moderate, or severe)   - MILD (1-3): doesn't interfere with normal activities    - MODERATE (4-7): interferes with normal activities or awakens from sleep    - SEVERE (8-10): excruciating pain and patient unable to do normal activities (stays in bed)       5-6 on scale 4. PATTERN: "Does the pain come and go, or is it constant?"      This morning it's constant pain 5. CAUSE: "What do you think is causing the pain?"     Kidney stone 6. OTHER SYMPTOMS:  "Do you have any other symptoms?" (e.g., fever, abdominal pain, vomiting, leg weakness, burning with urination, blood in urine)     Abd swelling and fatigued.     Last week urine was orange.   This morning my urine looks normal. 7. PREGNANCY:  "Is there any chance you are pregnant?" "When was your last menstrual period?"     Not asked  Protocols used: Flank Pain-A-AH

## 2021-01-11 ENCOUNTER — Ambulatory Visit: Payer: Self-pay | Admitting: *Deleted

## 2021-01-11 ENCOUNTER — Telehealth: Payer: Self-pay

## 2021-01-11 NOTE — Telephone Encounter (Signed)
Did not need to speak with pt again.   I triaged her yesterday.   Waiting for further directions from Dr. Zigmund Daniel.  Had agent transfer to practice.

## 2021-01-11 NOTE — Telephone Encounter (Signed)
Patient called the office and first spoke with the [place]. Patient said she also called yesterday afternoon and the nurse did not call her back.   I told her it looks as though the nurse transferred the message to Dr. Zigmund Daniel and he is out of the office today along with his nurse.  Patient said she is having burning and pain when urinating, flank pain. She said Per her recent CT she has a kidney stone.   She is now having issues urinating and her urine flow feels like its stopping and she cannot go.  Told her since Dr Zigmund Daniel is not here for advice I highly recommend the ER or Urgent care. She could have her urine blocked by the stone OR she could be getting ready to pass the stone.   Told her to push fluids. And seek care at Urgent care or ER.

## 2021-01-14 ENCOUNTER — Other Ambulatory Visit: Payer: Self-pay | Admitting: Family Medicine

## 2021-01-14 DIAGNOSIS — N210 Calculus in bladder: Secondary | ICD-10-CM

## 2021-01-14 NOTE — Telephone Encounter (Signed)
Patient notified and verbalized understanding.  No further questions at this time. Will get X-Ray today or tomorrow.

## 2021-01-15 ENCOUNTER — Ambulatory Visit: Payer: Self-pay
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
## 2021-01-16 ENCOUNTER — Ambulatory Visit
Admission: RE | Admit: 2021-01-16 | Discharge: 2021-01-16 | Disposition: A | Discharge status: Home / Self Care | Payer: Medicaid Other | Source: Ambulatory Visit | Attending: Family Medicine | Admitting: Family Medicine

## 2021-01-16 ENCOUNTER — Encounter: Payer: Medicaid Other | Admitting: Family Medicine

## 2021-01-16 ENCOUNTER — Ambulatory Visit
Admission: RE | Admit: 2021-01-16 | Discharge: 2021-01-16 | Disposition: A | Discharge status: Home / Self Care | Payer: Medicaid Other | Attending: Family Medicine | Admitting: Family Medicine

## 2021-01-16 ENCOUNTER — Other Ambulatory Visit: Payer: Self-pay

## 2021-01-16 ENCOUNTER — Ambulatory Visit: Payer: Self-pay

## 2021-01-16 DIAGNOSIS — N210 Calculus in bladder: Secondary | ICD-10-CM

## 2021-01-16 DIAGNOSIS — R309 Painful micturition, unspecified: Secondary | ICD-10-CM | POA: Diagnosis not present

## 2021-01-16 DIAGNOSIS — M419 Scoliosis, unspecified: Secondary | ICD-10-CM | POA: Diagnosis not present

## 2021-01-16 NOTE — Telephone Encounter (Signed)
Patient presented to the urgent care today, but decided to leave due to not wanting to expose herself and her son to Covid patients and the 2.5 hour wait.  Spoke with patient on the phone and continued to encourage her to go to the ER and she declined.  Patient states she had an appointment with her psychiatrist today, but states she was too tired to go so she cancelled her appointment.  Patient presented to our office just now and stated that I needed to go down to urgent care and get her urine that she left so I could test it since it was orange and I declined since it was the urgent care who ordered the lab.  Patient has not taken any pyridium to cause an orange color.  Gave patient lab requisitions to come back for fasting labs at a later date while she was here in person and advised she can go get the abdominal X-ray Dr. Zigmund Daniel ordered while she was here.  Advised patient we cannot see her today once again and she needed to go to the ER.  Patient declined.
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
## 2021-01-16 NOTE — Telephone Encounter (Signed)
Answer Assessment - Initial Assessment Questions 1. SYMPTOM: "What's the main symptom you're concerned about?" (e.g., frequency, incontinence)     Not emptying bladder, burning and pain 2. ONSET: "When did the  symptoms start?"     3 days ago 3. PAIN: "Is there any pain?" If Yes, ask: "How bad is it?" (Scale: 1-10; mild, moderate, severe)     Moderate 4. CAUSE: "What do you think is causing the symptoms?"     Maybe STD or infection 5. OTHER SYMPTOMS: "Do you have any other symptoms?" (e.g., fever, flank pain, blood in urine, pain with urination)     Urine is orange 6. PREGNANCY: "Is there any chance you are pregnant?" "When was your last menstrual period?"     No  Protocols used: Urinary Symptoms-A-AH

## 2021-01-16 NOTE — Telephone Encounter (Signed)
Called patient and left a detailed voicemail.  Please advise further.  See below.

## 2021-01-17 DIAGNOSIS — F339 Major depressive disorder, recurrent, unspecified: Secondary | ICD-10-CM | POA: Diagnosis not present

## 2021-01-17 DIAGNOSIS — F900 Attention-deficit hyperactivity disorder, predominantly inattentive type: Secondary | ICD-10-CM | POA: Diagnosis not present

## 2021-01-17 DIAGNOSIS — F410 Panic disorder [episodic paroxysmal anxiety] without agoraphobia: Secondary | ICD-10-CM | POA: Diagnosis not present

## 2021-01-18 NOTE — Progress Notes (Signed)
Please let patient know that the previously noted stone is no longer seen (and no other stones are seen either).

## 2021-01-22 ENCOUNTER — Other Ambulatory Visit: Payer: Self-pay

## 2021-01-22 ENCOUNTER — Encounter: Payer: Self-pay | Admitting: Family Medicine

## 2021-01-22 ENCOUNTER — Ambulatory Visit: Payer: Medicaid Other | Admitting: Family Medicine

## 2021-01-22 VITALS — BP 118/72 | HR 83 | Temp 98.3°F | Ht 66.0 in | Wt 182.0 lb

## 2021-01-22 DIAGNOSIS — Z711 Person with feared health complaint in whom no diagnosis is made: Secondary | ICD-10-CM | POA: Diagnosis not present

## 2021-01-22 DIAGNOSIS — N3011 Interstitial cystitis (chronic) with hematuria: Secondary | ICD-10-CM | POA: Diagnosis not present

## 2021-01-22 DIAGNOSIS — R300 Dysuria: Secondary | ICD-10-CM | POA: Diagnosis not present

## 2021-01-22 LAB — POCT URINALYSIS DIPSTICK
Bilirubin, UA: NEGATIVE
Glucose, UA: NEGATIVE
Ketones, UA: NEGATIVE
Leukocytes, UA: NEGATIVE
Nitrite, UA: NEGATIVE
Protein, UA: NEGATIVE
Spec Grav, UA: <=1.005 — AB (ref 1.010–1.025)
Urobilinogen, UA: 0.2 E.U./dL
pH, UA: 5 (ref 5.0–8.0)
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
## 2021-01-22 NOTE — Patient Instructions (Signed)
-   Obtain labs with orders provided today - Our office will contact you with results and next steps - Keep follow-up with urology this Friday - Contact our office for any questions
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
## 2021-01-23 LAB — RPR: RPR Ser Ql: NONREACTIVE

## 2021-01-23 LAB — HIV ANTIBODY (ROUTINE TESTING W REFLEX): HIV Screen 4th Generation wRfx: NONREACTIVE

## 2021-01-25 ENCOUNTER — Other Ambulatory Visit: Payer: Self-pay | Admitting: Family Medicine

## 2021-01-25 ENCOUNTER — Telehealth: Payer: Self-pay

## 2021-01-25 DIAGNOSIS — A6000 Herpesviral infection of urogenital system, unspecified: Secondary | ICD-10-CM

## 2021-01-25 MED ORDER — ACYCLOVIR 400 MG PO TABS: 400.0000 mg | ORAL_TABLET | Freq: Three times a day (TID) | ORAL | 0 refills | Status: AC

## 2021-01-25 NOTE — Telephone Encounter (Signed)
I have sent in a 7 day course for her to start.

## 2021-01-25 NOTE — Telephone Encounter (Signed)
Please advise.  Chlamydia and Gonorrhea still in process.

## 2021-01-25 NOTE — Telephone Encounter (Signed)
Please see other message, refill sent in. FYI

## 2021-01-25 NOTE — Telephone Encounter (Signed)
   Notes to clinic: The previous providers that prescribed this have turned in their medical licenses. The patient is under a lot of stress and has a bad break out currently   Requested Prescriptions  Pending Prescriptions Disp Refills   acyclovir (ZOVIRAX) 400 MG tablet      Sig: Take 1 tablet (400 mg total) by mouth every 8 (eight) hours.      Antimicrobials:  Antiviral Agents - Anti-Herpetic Passed - 01/25/2021 12:52 PM      Passed - Valid encounter within last 12 months    Recent Outpatient Visits           3 days ago Concern about sexually transmitted disease in female without diagnosis   [facility] Montel Culver, MD   3 weeks ago Interstitial cystitis (chronic) with hematuria   [facility] Montel Culver, MD   2 months ago Frequency of urination   [facility] Montel Culver, MD       Future Appointments             In 1 week Hollice Espy, MD [place]   In 1 week Zigmund Daniel Earley Abide, MD [facility]

## 2021-01-25 NOTE — Telephone Encounter (Signed)
Medication Refill - Medication: acyclovir (ZOVIRAX) 400 MG tablet   The previous providers that prescribed this have turned in their medical licenses. The patient is under a lot of stress and has a bad break out currently.   Has the patient contacted their pharmacy? Yes.   (Agent: If no, request that the patient contact the pharmacy for the refill.) (Agent: If yes, when and what did the pharmacy advise?)  Preferred Pharmacy (with phone number or street name):  [id number] - [place], [address]  [address] [place] [address]  Phone: [phone] Fax: [phone]    Agent: Please be advised that RX refills may take up to 3 business days. We ask that you follow-up with your pharmacy.

## 2021-01-25 NOTE — Telephone Encounter (Signed)
Patient notified and verbalized understanding.  Resent link for patient to set up MyChart account so she is able to view results sooner and communicate needs.  Patient would like a refill on acyclovir 400 mg three times daily.  She is having a current breakout.  Please advise.

## 2021-01-25 NOTE — Telephone Encounter (Signed)
Refill if appropriate.  Please advise.  

## 2021-01-25 NOTE — Telephone Encounter (Unsigned)
Copied from [place] [id number]. Topic: General - Other >> Jan 25, 2021 10:54 AM Leward Quan A wrote: Reason for CRM: Patient called in to inquire about test results from last week would like a call back from Dr Zigmund Daniel today please call Ph#  [phone]

## 2021-01-26 LAB — CT NG M GENITALIUM NAA, URINE
Chlamydia trachomatis, NAA: NEGATIVE
Mycoplasma genitalium NAA: NEGATIVE
Neisseria gonorrhoeae, NAA: NEGATIVE

## 2021-01-29 ENCOUNTER — Other Ambulatory Visit: Payer: Self-pay | Admitting: Family Medicine

## 2021-02-01 ENCOUNTER — Ambulatory Visit: Payer: Medicaid Other | Admitting: Urology

## 2021-02-04 ENCOUNTER — Telehealth: Payer: Self-pay | Admitting: Family Medicine

## 2021-02-04 NOTE — Telephone Encounter (Signed)
Pt is calling to state that she lost her Counter Form to have her lab work done at [facility]. Can she pick up another form? [phone]

## 2021-02-04 NOTE — Telephone Encounter (Signed)
Forms were placed in the basket over a week ago for patient to pick up 02/01/21 before her appointment with Urology (which she cancelled).  Patient also cancelled and rescheduled appointment for tomorrow,02/05/21, to 02/13/21.  Please advise patient she can pick up lab requisitions any time as they are in the basket up front and she must be fasting for the labs.

## 2021-02-05 ENCOUNTER — Ambulatory Visit: Payer: Medicaid Other | Admitting: Family Medicine

## 2021-02-05 NOTE — Telephone Encounter (Signed)
Patient called back says will pick up paperwork soon. She has been having personal problems and problems with her phone. She wanted to explain this.

## 2021-02-13 ENCOUNTER — Encounter: Payer: Self-pay | Admitting: Family Medicine

## 2021-02-13 ENCOUNTER — Other Ambulatory Visit: Payer: Self-pay

## 2021-02-13 ENCOUNTER — Ambulatory Visit (INDEPENDENT_AMBULATORY_CARE_PROVIDER_SITE_OTHER): Payer: Medicaid Other | Admitting: Family Medicine

## 2021-02-13 ENCOUNTER — Telehealth: Payer: Self-pay

## 2021-02-13 VITALS — BP 140/82 | HR 73 | Temp 98.3°F | Ht 66.0 in | Wt 182.0 lb

## 2021-02-13 DIAGNOSIS — R399 Unspecified symptoms and signs involving the genitourinary system: Secondary | ICD-10-CM

## 2021-02-13 DIAGNOSIS — R300 Dysuria: Secondary | ICD-10-CM

## 2021-02-13 LAB — POCT URINALYSIS DIPSTICK
Bilirubin, UA: NEGATIVE
Glucose, UA: NEGATIVE
Ketones, UA: NEGATIVE
Leukocytes, UA: NEGATIVE
Nitrite, UA: NEGATIVE
Protein, UA: NEGATIVE
Spec Grav, UA: 1.01 (ref 1.010–1.025)
Urobilinogen, UA: 0.2 E.U./dL
pH, UA: 5 (ref 5.0–8.0)

## 2021-02-13 NOTE — Progress Notes (Signed)
Primary Care / Sports Medicine Office Visit  Patient Information:  Patient ID: JLEIGH STRIPLIN, female DOB: 1970/08/23 Age: 50 y.o. MRN: [id number]   Shirley [name] is a pleasant 50 y.o. female presenting with the following:  Chief Complaint  Patient presents with   Follow-up    Review of Systems pertinent details above   Patient Active Problem List   Diagnosis Date Noted   Concern about sexually transmitted disease in female without diagnosis 01/22/2021   Dysuria 01/22/2021   Hyperlipidemia 12/31/2020   Renal stone 12/31/2020   Hidradenitis suppurativa of left axilla 12/31/2020   Cellulitis of left buttock 12/31/2020   Interstitial cystitis (chronic) with hematuria 11/15/2020   Perimenopause 09/19/2016   Family history of ovarian cancer 09/19/2016   Family history of colon cancer 09/19/2016   Anxiety 09/19/2016   Increased BMI 09/19/2016   Cyst of right ovary 09/19/2016   Abnormal uterine bleeding (AUB) 09/19/2016   Frequency of urination 09/19/2016   Status post LEEP (loop electrosurgical excision procedure) of cervix 09/19/2016   Past Medical History:  Diagnosis Date   Abnormal Pap smear of cervix    ADHD (attention deficit hyperactivity disorder)    Anxiety    Mitral valve prolapse    Spina bifida occulta    UTI (urinary tract infection)    Outpatient Encounter Medications as of 02/13/2021  Medication Sig   albuterol (PROVENTIL HFA;VENTOLIN HFA) 108 (90 Base) MCG/ACT inhaler Inhale into the lungs every 6 (six) hours as needed for wheezing or shortness of breath.   ALPRAZolam (XANAX) 1 MG tablet Take 1 mg by mouth 3 (three) times daily as needed for anxiety.   amphetamine-dextroamphetamine (ADDERALL) 10 MG tablet Take 10 mg by mouth 2 (two) times daily.   cetirizine (ZYRTEC) 10 MG tablet Take 1 tablet (10 mg total) by mouth daily.   citalopram (CELEXA) 20 MG tablet Take 20 mg by mouth daily.   fluticasone (FLONASE) 50 MCG/ACT nasal spray SPRAY 2 SPRAYS INTO EACH  NOSTRIL EVERY DAY   fosfomycin (MONUROL) 3 g PACK TAKE 3 G BY MOUTH ONCE FOR 1 DOSE.   hydrochlorothiazide (HYDRODIURIL) 12.5 MG tablet Take 12.5 mg by mouth daily.   lisinopril (PRINIVIL,ZESTRIL) 10 MG tablet Take 10 mg by mouth daily.   PREMPRO 0.45-1.5 MG tablet Take 1 tablet by mouth daily.   tamsulosin (FLOMAX) 0.4 MG CAPS capsule Take 1 capsule (0.4 mg total) by mouth daily after supper.   VITAMIN D PO Take 1 capsule by mouth in the morning and at bedtime.   fenofibrate (TRICOR) 145 MG tablet Take 145 mg by mouth daily. (Patient not taking: No sig reported)   hydrOXYzine (VISTARIL) 25 MG capsule TAKE 1-2 CAPSULES (25-50 MG TOTAL) BY MOUTH AT BEDTIME AS NEEDED (BLADDER PAIN). (Patient not taking: No sig reported)   metoprolol succinate (TOPROL-XL) 100 MG 24 hr tablet Take 100 mg by mouth daily. (Patient not taking: No sig reported)   No facility-administered encounter medications on file as of 02/13/2021.   Past Surgical History:  Procedure Laterality Date   LEEP      Vitals:   02/13/21 1122  BP: 140/82  Pulse: 73  Temp: 98.3 F (36.8 C)  SpO2: 97%   Vitals:   02/13/21 1122  Weight: 182 lb (82.6 kg)  Height: _0  (1.676 m)   Body mass index is 29.38 kg/m.  DG Abd 1 View  Result Date: 01/17/2021 CLINICAL DATA:  Assess stones. Patient reports burning with urination for  2 weeks. EXAM: ABDOMEN - 1 VIEW COMPARISON:  Noncontrast CT 11/20/2020 FINDINGS: Previous pelvic calcification corresponds to stone in the urinary bladder is not definitively seen. No stones over the renal beds or course of the ureters. Normal bowel gas pattern with small volume of stool. No concerning intraabdominal mass effect. Lung bases are clear. Similar lumbar scoliosis. IMPRESSION: Previous bladder stone is not definitively seen. No evidence of urolithiasis on the current exam. Electronically Signed   By: Keith Rake M.D.   On: 01/17/2021 21:07     Independent interpretation of notes and tests  performed by another provider:   None  Procedures performed:   None  Pertinent History, Exam, Impression, and Recommendations:   Dysuria Patient with chronic dysuria with serial urinalyses significant primarily for blood. Patient's overall clinical presentation concerning for interstitial cystitis and pharmacotherapy was initiated however patient was not compliant with this plan despite repeated recommendations. Given her longstanding concerns and shared desire to resolve this issue referral to urology had been placed at earlier visits. Our office staff has made every effort to ensure close follow-up with urology to fit patient's schedule. Despite this, patient has unfortunately missed three consecutive visits and has not had a chance to see urology to date. Labs were also ordered in regards to STI testing and patient verbalized that she did present for lab draw but after no results noted, [facility] was contacted and they have no record of patient presenting for this most recent draw. Given this uncertain condition requiring a further level of specialized care with urology in the setting of continued non-compliance, I have advised patient that her care needs would be better met in a different setting/clinic with a different provider. A letter was provided to the patient today regarding dismissal from clinic.    Orders & Medications No orders of the defined types were placed in this encounter.  Orders Placed This Encounter  Procedures   POCT Urinalysis Dipstick     No follow-ups on file.     Montel Culver, MD   Primary Care Sports Medicine [place]

## 2021-02-13 NOTE — Assessment & Plan Note (Addendum)
Patient with chronic dysuria with serial urinalyses significant primarily for blood. Patient's overall clinical presentation concerning for interstitial cystitis and pharmacotherapy was initiated however patient was not compliant with this plan despite repeated recommendations. Given her longstanding concerns and shared desire to resolve this issue referral to urology had been placed at earlier visits. Our office staff has made every effort to ensure close follow-up with urology to fit patient's schedule. Despite this, patient has unfortunately missed three consecutive visits and has not had a chance to see urology to date. Labs were also ordered in regards to STI testing and patient verbalized that she did present for lab draw but after no results noted, [facility] was contacted and they have no record of patient presenting for this most recent draw. Given this uncertain condition requiring a further level of specialized care with urology in the setting of continued non-compliance, I have advised patient that her care needs would be better met in a different setting/clinic with a different provider. A letter was provided to the patient today regarding dismissal from clinic.

## 2021-02-13 NOTE — Telephone Encounter (Signed)
Pt called in wanting Amy to give her a call back. Please advise.

## 2021-02-13 NOTE — Telephone Encounter (Signed)
    Primary Care / Sports Medicine Note  Patient Information:  Patient ID: Shirley [name], female DOB: 03-Nov-1970 Age: 50 y.o. MRN: [id number]   Patient with chronic dysuria with serial urinalyses significant primarily for blood. Patient's overall clinical presentation concerning for interstitial cystitis and pharmacotherapy was initiated however patient was not compliant with this plan despite repeated recommendations. Given her longstanding concerns and shared desire to resolve this issue referral to urology had been placed at earlier visits.  Our office staff has made every effort to ensure close follow-up with urology to fit patient's schedule. Despite this, patient has unfortunately missed three consecutive visits and has not had a chance to see urology to date. Labs were also ordered in regards to STI testing and patient verbalized that she did present for lab draw but after no results noted, [facility] was contacted and they have no record of patient presenting for this most recent draw.  Given this uncertain condition requiring a further level of specialized care with urology in the setting of continued non-compliance, I have advised patient that her care needs would be better met in a different setting/clinic with a different provider. Unfortunately patient began harsh verbal outbursts involving profanity, threatening body language, threats of legal action, and began shouting in shared patient care areas including the nurses' station, and at the check-in / waiting room where other patients were present. A letter was provided to the patient today regarding dismissal from clinic.  Montel Culver, MD   Primary Care Sports Medicine [place]

## 2021-02-13 NOTE — Telephone Encounter (Signed)
Patient had an appt with Dr Zigmund Daniel today in regards to a UTI.  Pt had no showed her appt with Urology 2 or 3 times already.  Pt was dissatisfied with what Dr Zigmund Daniel had said & wanted to end the visit & care with him.  Patient proceeded to come into the waiting room cursing & yelling at the front desk.  She claimed she was going to get a lawyer & sue Dr Zigmund Daniel bc he decided to dismiss her from the practice.  She stated Dr Zigmund Daniel was rude & a smart mouth.  She demanded some sort of documentation be given that she was dismissed from the practice.  I gave her a dismissal letter that was signed by Dr Zigmund Daniel.  Her copay was refunded as well & there was no charge for the visit today.

## 2021-02-21 ENCOUNTER — Other Ambulatory Visit: Payer: Self-pay | Admitting: Family Medicine

## 2021-02-25 ENCOUNTER — Other Ambulatory Visit: Payer: Self-pay | Admitting: Family Medicine

## 2021-03-01 ENCOUNTER — Encounter: Payer: Self-pay | Admitting: Emergency Medicine

## 2021-03-01 ENCOUNTER — Other Ambulatory Visit: Payer: Self-pay

## 2021-03-01 ENCOUNTER — Ambulatory Visit
Admission: EM | Admit: 2021-03-01 | Discharge: 2021-03-01 | Disposition: A | Discharge status: Home / Self Care | Payer: Medicaid Other | Attending: Sports Medicine | Admitting: Sports Medicine

## 2021-03-01 DIAGNOSIS — N3090 Cystitis, unspecified without hematuria: Secondary | ICD-10-CM | POA: Diagnosis not present

## 2021-03-01 DIAGNOSIS — R300 Dysuria: Secondary | ICD-10-CM | POA: Insufficient documentation

## 2021-03-01 LAB — URINALYSIS, COMPLETE (UACMP) WITH MICROSCOPIC
Bilirubin Urine: NEGATIVE
Glucose, UA: NEGATIVE mg/dL
Ketones, ur: NEGATIVE mg/dL
Nitrite: NEGATIVE
Protein, ur: NEGATIVE mg/dL
Specific Gravity, Urine: <1.005 — ABNORMAL LOW (ref 1.005–1.030)
pH: 6 (ref 5.0–8.0)

## 2021-03-01 LAB — PREGNANCY, URINE: Preg Test, Ur: NEGATIVE

## 2021-03-01 MED ORDER — PHENAZOPYRIDINE HCL 200 MG PO TABS: 200.0000 mg | ORAL_TABLET | Freq: Three times a day (TID) | ORAL | 0 refills | Status: DC

## 2021-03-01 NOTE — ED Provider Notes (Signed)
[facility]    CSN: [id number] Arrival date & time: 03/01/21  1507      History   Chief Complaint Chief Complaint  Patient presents with   Dysuria    HPI Shirley [name] is a 51 y.o. female presenting for approximately 2-week history of intermittent dysuria and urinary frequency.  Also admits to lower back aching, abdominal bloating and occasional abdominal cramping.  Patient does have history of recurrent urinary symptoms.  Shirley [name] has been diagnosed with interstitial cystitis in the past.  Patient says Shirley [name] has seen urologist in the past but has been a couple of years.  States that Shirley [name] is supposed to be seeing a new PCP at the end of the month and then has a request for a referral to a new urologist at that time.  Patient currently denies any fever, fatigue, hematuria, vaginal discharge, nausea or vomiting.  Has occasionally taken over-the-counter AZO which sometimes helps symptoms.  Patient believes Shirley [name] may have a UTI currently.  Shirley [name] has no other complaints or concerns.  HPI  Past Medical History:  Diagnosis Date   Abnormal Pap smear of cervix    ADHD (attention deficit hyperactivity disorder)    Anxiety    Mitral valve prolapse    Spina bifida occulta    UTI (urinary tract infection)     Patient Active Problem List   Diagnosis Date Noted   Concern about sexually transmitted disease in female without diagnosis 01/22/2021   Dysuria 01/22/2021   Hyperlipidemia 12/31/2020   Renal stone 12/31/2020   Hidradenitis suppurativa of left axilla 12/31/2020   Cellulitis of left buttock 12/31/2020   Interstitial cystitis (chronic) with hematuria 11/15/2020   Perimenopause 09/19/2016   Family history of ovarian cancer 09/19/2016   Family history of colon cancer 09/19/2016   Anxiety 09/19/2016   Increased BMI 09/19/2016   Cyst of right ovary 09/19/2016   Abnormal uterine bleeding (AUB) 09/19/2016   Frequency of urination 09/19/2016   Status post LEEP (loop electrosurgical  excision procedure) of cervix 09/19/2016    Past Surgical History:  Procedure Laterality Date   LEEP      OB History     Gravida  2   Para  2   Term  2   Preterm      AB      Living  2      SAB      IAB      Ectopic      Multiple      Live Births  2        Obstetric Comments  Reporst 6 pound first NSD and 8'10" forceps 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 Authorizing Provider  phenazopyridine (PYRIDIUM) 200 MG tablet Take 1 tablet (200 mg total) by mouth 3 (three) times daily. 03/01/21  Yes Laurene Footman B, PA-C  albuterol (PROVENTIL HFA;VENTOLIN HFA) 108 (90 Base) MCG/ACT inhaler Inhale into the lungs every 6 (six) hours as needed for wheezing or shortness of breath.    [provider]  ALPRAZolam Duanne Moron) 1 MG tablet Take 1 mg by mouth 3 (three) times daily as needed for anxiety.    [provider]  amphetamine-dextroamphetamine (ADDERALL) 10 MG tablet Take 10 mg by mouth 2 (two) times daily. 12/18/20   [provider]  cetirizine (ZYRTEC) 10 MG  tablet Take 1 tablet (10 mg total) by mouth daily. 01/07/21   Montel Culver, MD  citalopram (CELEXA) 20 MG tablet Take 20 mg by mouth daily. 01/17/21   [provider]  fenofibrate (TRICOR) 145 MG tablet Take 145 mg by mouth daily. Patient not taking: No sig reported 08/31/20   [provider]  fluticasone (FLONASE) 50 MCG/ACT nasal spray SPRAY 2 SPRAYS INTO EACH NOSTRIL EVERY DAY 01/29/21   Montel Culver, MD  fosfomycin (MONUROL) 3 g PACK TAKE 3 G BY MOUTH ONCE FOR 1 DOSE. 10/16/20   Rubie Maid, MD  hydrochlorothiazide (HYDRODIURIL) 12.5 MG tablet Take 12.5 mg by mouth daily. 06/04/20   [provider]  hydrOXYzine (VISTARIL) 25 MG capsule TAKE 1-2 CAPSULES (25-50 MG TOTAL) BY MOUTH AT BEDTIME AS NEEDED (BLADDER PAIN). Patient not  taking: No sig reported 11/22/20   Montel Culver, MD  lisinopril (PRINIVIL,ZESTRIL) 10 MG tablet Take 10 mg by mouth daily.    [provider]  metoprolol succinate (TOPROL-XL) 100 MG 24 hr tablet Take 100 mg by mouth daily. Patient not taking: No sig reported 08/11/20   [provider]  PREMPRO 0.45-1.5 MG tablet Take 1 tablet by mouth daily. 01/03/21   [provider]  tamsulosin (FLOMAX) 0.4 MG CAPS capsule Take 1 capsule (0.4 mg total) by mouth daily after supper. 12/31/20   Montel Culver, MD  VITAMIN D PO Take 1 capsule by mouth in the morning and at bedtime.    [provider]    Family History Family History  Problem Relation Age of Onset   Ovarian cancer Mother    Pancreatic cancer Mother    Diabetes Mellitus II Mother    Cancer Father    Diabetes Mellitus II Father    Coronary artery disease Father    Anxiety disorder Sister    Mitral valve prolapse Sister    Drug abuse Daughter    Heart attack Sister     Social History Social History   Tobacco Use   Smoking status: Never   Smokeless tobacco: Never  Vaping Use   Vaping Use: Never used  Substance Use Topics   Alcohol use: Not Currently   Drug use: Never     Allergies   Bactrim [sulfamethoxazole-trimethoprim], Gadolinium, Gadolinium derivatives, Iodinated diagnostic agents, Iodine, Other, Sulfa antibiotics, Azithromycin, Clarithromycin, Furosemide, Guaifenesin, Macrobid [nitrofurantoin monohyd macro], Nitrofurantoin, Penicillins, Amoxicillin, Doxycycline, Keflex [cephalexin], and Levofloxacin   Review of Systems Review of Systems  Constitutional:  Negative for chills, fatigue and fever.  Gastrointestinal:  Positive for abdominal pain. Negative for diarrhea, nausea and vomiting.  Genitourinary:  Positive for dysuria, frequency and urgency. Negative for decreased urine volume, flank pain, hematuria, pelvic pain, vaginal bleeding, vaginal discharge and vaginal pain.   Musculoskeletal:  Positive for back pain.  Skin:  Negative for rash.    Physical Exam Triage Vital Signs ED Triage Vitals  Enc Vitals Group     BP 03/01/21 1529 (!) 108/97     Pulse Rate 03/01/21 1529 86     Resp 03/01/21 1529 14     Temp 03/01/21 1529 98.4 F (36.9 C)     Temp Source 03/01/21 1529 Oral     SpO2 03/01/21 1529 99 %     Weight 03/01/21 1528 182 lb 1.6 oz (82.6 kg)     Height 03/01/21 1528 '5\' 6"'$  (1.676 m)     Head Circumference --      Peak Flow --  Pain Score 03/01/21 1528 9     Pain Loc --      Pain Edu? --      Excl. in [place]? --    No data found.  Updated Vital Signs BP (!) 108/97 (BP Location: Right Arm)   Pulse 86   Temp 98.4 F (36.9 C) (Oral)   Resp 14   Ht '5\' 6"'$  (1.676 m)   Wt 182 lb 1.6 oz (82.6 kg)   SpO2 99%   BMI 29.39 kg/m      Physical Exam Vitals and nursing note reviewed.  Constitutional:      General: Shirley [name] is not in acute distress.    Appearance: Normal appearance. Shirley [name] is not ill-appearing or toxic-appearing.  HENT:     Head: Normocephalic and atraumatic.  Eyes:     General: No scleral icterus.       Right eye: No discharge.        Left eye: No discharge.     Conjunctiva/sclera: Conjunctivae normal.  Cardiovascular:     Rate and Rhythm: Normal rate and regular rhythm.     Heart sounds: Normal heart sounds.  Pulmonary:     Effort: Pulmonary effort is normal. No respiratory distress.     Breath sounds: Normal breath sounds.  Abdominal:     Palpations: Abdomen is soft.     Tenderness: There is abdominal tenderness (TTP diffusely throughout lower abdomen (RLQ, LLQ, suprapubic)). There is no right CVA tenderness or left CVA tenderness.  Musculoskeletal:     Cervical back: Neck supple.  Skin:    General: Skin is dry.  Neurological:     General: No focal deficit present.     Mental Status: Shirley [name] is alert. Mental status is at baseline.     Motor: No weakness.     Gait: Gait normal.  Psychiatric:        Mood and Affect: Mood  normal.        Behavior: Behavior normal.        Thought Content: Thought content normal.     UC Treatments / Results  Labs (all labs ordered are listed, but only abnormal results are displayed) Labs Reviewed  URINALYSIS, COMPLETE (UACMP) WITH MICROSCOPIC - Abnormal; Notable for the following components:      Result Value   Specific Gravity, Urine <1.005 (*)    Hgb urine dipstick TRACE (*)    Leukocytes,Ua TRACE (*)    Bacteria, UA FEW (*)    All other components within normal limits  URINE CULTURE  PREGNANCY, URINE    EKG   Radiology No results found.  Procedures Procedures (including critical care time)  Medications Ordered in UC Medications - No data to display  Initial Impression / Assessment and Plan / UC Course  I have reviewed the triage vital signs and the nursing notes.  Pertinent labs & imaging results that were available during my care of the patient were reviewed by me and considered in my medical decision making (see chart for details).  50 year old female presenting for concerns about possible UTI.  Symptoms have been intermittent over the past 2 weeks but seem to be getting worse over the past day.  No sick dysuria, urinary frequency and urgency as well as lower back aching and abdominal cramping and bloating.  Vital signs are presently stable patient is overall well-appearing.  Shirley [name] is afebrile.  Exam significant for mildly distended abdomen with tenderness to palpation of the right and left lower quadrant and suprapubic region.  No guarding or rebound.  Patient requested pregnancy test which is negative.  UA is positive for trace blood and trace leuks.  We will send urine for culture and treat for UTI if positive.  Given patient's history of symptoms for the past 2 weeks and history of interstitial cystitis without any significant findings on UA today, will await results of urine culture before treating.  Advised patient to see if Shirley [name] can get into see her  PCP sooner as Shirley [name] does seem to see a urologist.  Patient agrees that Shirley [name] is ready to see a urologist.  I have sent Pyridium at this time.  Advised to follow-up with [place] as needed.  ED precautions reviewed.   Final Clinical Impressions(s) / UC Diagnoses   Final diagnoses:  Cystitis  Dysuria     Discharge Instructions      The urine is not necessarily consistent with a urinary tract infection but I will culture the urine to make sure there is no bacterial growth.  If you do have bacterial growth we will call and start you on antibiotic is soon as possible.  At this time I have sent in Pyridium to help with the burning symptoms.  Cut back on your water intake just a tad as you are a little over hydrated.  You can take Tylenol or NSAIDs for discomfort if there is no allergy reason not to take it based on your history.  Contact your clinicians office and see if they can see you sooner than the end of the month.  Sometimes they have cancellations and may be able to get you in sooner.  It is imperative that you follow-up with the urologist.  If you do have interstitial cystitis there are other medications to try.  But again, as we discussed if you do have bacterial growth on the urine culture we will call and start you on antibiotic.     ED Prescriptions     Medication Sig Dispense Auth. Provider   phenazopyridine (PYRIDIUM) 200 MG tablet Take 1 tablet (200 mg total) by mouth 3 (three) times daily. 6 tablet Gretta Cool      PDMP not reviewed this encounter.   Danton Clap, PA-C 03/01/21 1656

## 2021-03-01 NOTE — Discharge Instructions (Addendum)
The urine is not necessarily consistent with a urinary tract infection but I will culture the urine to make sure there is no bacterial growth.  If you do have bacterial growth we will call and start you on antibiotic is soon as possible.  At this time I have sent in Pyridium to help with the burning symptoms.  Cut back on your water intake just a tad as you are a little over hydrated.  You can take Tylenol or NSAIDs for discomfort if there is no allergy reason not to take it based on your history.  Contact your clinicians office and see if they can see you sooner than the end of the month.  Sometimes they have cancellations and may be able to get you in sooner.  It is imperative that you follow-up with the urologist.  If you do have interstitial cystitis there are other medications to try.  But again, as we discussed if you do have bacterial growth on the urine culture we will call and start you on antibiotic.

## 2021-03-01 NOTE — ED Triage Notes (Signed)
Patient c/o burning when urinating and lower back pain that started 2 weeks ago.  Patient states that her symptoms have been off and on.  Patient states that she has a referral to a Dealer.

## 2021-03-03 LAB — URINE CULTURE: Culture: <10000 — AB

## 2021-03-12 ENCOUNTER — Other Ambulatory Visit
Admission: RE | Admit: 2021-03-12 | Discharge: 2021-03-12 | Disposition: A | Discharge status: Home / Self Care | Payer: Medicaid Other | Attending: Urology | Admitting: Urology

## 2021-03-12 ENCOUNTER — Ambulatory Visit (INDEPENDENT_AMBULATORY_CARE_PROVIDER_SITE_OTHER): Payer: Medicaid Other | Admitting: Urology

## 2021-03-12 ENCOUNTER — Encounter: Payer: Self-pay | Admitting: Urology

## 2021-03-12 ENCOUNTER — Encounter: Payer: Self-pay | Admitting: Obstetrics and Gynecology

## 2021-03-12 ENCOUNTER — Ambulatory Visit: Payer: Medicaid Other | Admitting: Obstetrics and Gynecology

## 2021-03-12 ENCOUNTER — Other Ambulatory Visit: Payer: Self-pay | Admitting: *Deleted

## 2021-03-12 ENCOUNTER — Other Ambulatory Visit: Payer: Self-pay

## 2021-03-12 VITALS — BP 118/76 | HR 74 | Ht 66.0 in | Wt 182.0 lb

## 2021-03-12 DIAGNOSIS — R350 Frequency of micturition: Secondary | ICD-10-CM

## 2021-03-12 DIAGNOSIS — N3010 Interstitial cystitis (chronic) without hematuria: Secondary | ICD-10-CM | POA: Diagnosis not present

## 2021-03-12 DIAGNOSIS — N3281 Overactive bladder: Secondary | ICD-10-CM

## 2021-03-12 LAB — URINALYSIS, COMPLETE (UACMP) WITH MICROSCOPIC
Bacteria, UA: NONE SEEN
Bilirubin Urine: NEGATIVE
Glucose, UA: NEGATIVE mg/dL
Ketones, ur: NEGATIVE mg/dL
Nitrite: NEGATIVE
Protein, ur: NEGATIVE mg/dL
Specific Gravity, Urine: 1.01 (ref 1.005–1.030)
pH: 6 (ref 5.0–8.0)

## 2021-03-12 LAB — BLADDER SCAN AMB NON-IMAGING

## 2021-03-12 MED ORDER — AMITRIPTYLINE HCL 50 MG PO TABS: 50.0000 mg | ORAL_TABLET | Freq: Every day | ORAL | 3 refills | Status: DC

## 2021-03-12 NOTE — Patient Instructions (Signed)
Interstitial Cystitis  Interstitial cystitis is inflammation of the bladder. This condition is also known as painful bladder syndrome. This may cause pain in the bladder area as well as a frequent and urgent need to urinate. The bladder is an organ thatstores urine after the urine is made in the kidneys. The severity of interstitial cystitis can vary from person to person. You may have flare-ups, and then your symptoms may go away for a while. For many people, it becomes a long-term (chronic) problem. What are the causes? The cause of this condition is not known. What increases the risk? The following factors may make you more likely to develop this condition: You are female. You have fibromyalgia. You have irritable bowel syndrome (IBS). You have endometriosis. This condition may be aggravated by: Stress. Smoking. Spicy foods. What are the signs or symptoms? Symptoms of interstitial cystitis vary, and they can change over time. Symptoms may include: Discomfort or pain in the bladder area, which is in the lower abdomen. Pain can range from mild to severe. The pain may change in intensity as the bladder fills with urine or as it empties. Pain in the pelvic area, between the hip bones. An urgent need to urinate. Frequent urination. Pain during urination. Pain during sex. Blood in the urine. Fatigue. For women, symptoms often get worse during menstruation. How is this diagnosed? This condition is diagnosed based on your symptoms, your medical history, and a physical exam. You may have tests to rule out other conditions, such as: Urine tests. Cystoscopy. For this test, a tool similar to a very thin telescope is used to look into your bladder. Biopsy. This involves taking a sample of tissue from the bladder to be examined under a microscope. How is this treated? There is no cure for this condition, but treatment can help you control your symptoms. Work closely with your health care  provider to find the most effective treatments for you. Treatment options may include: Medicines to relieve pain and reduce how often you feel the need to urinate. This treatment may include: A procedure where a small amount of medicine that eases irritation is put inside your bladder through a catheter (bladder instillation). Lifestyle changes, such as changing your diet or taking steps to control stress. Physical therapy. This may include: Exercises to help relax the pelvic floor muscles. Massage to relax tight muscles (myofascial release). Learning ways to control when you urinate (bladder training). Using a device that provides electrical stimulation to your nerves, which can relieve pain (neuromodulation therapy). The device is placed on your back, where it blocks the nerves that cause you to feel pain in your bladder area. A procedure that stretches your bladder by filling it with air or fluid (hydrodistention). Surgery. This is rare. It is only done for extreme cases, if other treatments do not help. Follow these instructions at home: Lifestyle Learn and practice relaxation techniques, such as deep breathing and muscle relaxation. Get care for your body and mental well-being, such as: Cognitive behavioral therapy (CBT). This therapy changes the way you think or act in response to the fatigue. This may help improve how you feel. Seeing a mental health therapist to evaluate and treat depression, if necessary. Work with your health care provider on other ways to manage pain. Acupuncture may be helpful. Avoid drinking alcohol. Do not use any products that contain nicotine or tobacco, such as cigarettes, e-cigarettes, and chewing tobacco. If you need help quitting, ask your health care provider. Eating and  drinking Make dietary changes as recommended by your health care provider. You may need to avoid: Spicy foods. Foods that contain a lot of potassium. Limit your intake of drinks that make  you need to urinate. These include: Caffeinated drinks like soda, coffee, and tea. Alcohol. Bladder training  Use bladder training techniques as directed. Techniques may include: Urinating at scheduled times. Training yourself to delay urination. Keep a bladder diary. Write down the times that you urinate and any symptoms that you have. This can help you find out which foods, liquids, or activities make your symptoms worse. Use your bladder diary to schedule bathroom trips. If you are away from home, plan to be near a bathroom at each of your scheduled times. Make sure that you urinate just before you leave the house and just before you go to bed.  General instructions Take over-the-counter and prescription medicines only as told by your health care provider. You can try a warm or cool compress over your bladder for comfort. Avoid wearing tight clothing. Do exercises to relax your pelvic floor muscles as told by your physical therapist. Keep all follow-up visits as told by your health care provider. This is important. Where to find more information To find more information or a support group near you, visit: [employer]: urologyhealth.org Interstitial Cystitis Association: ClassPreviews.com.br Contact a health care provider if you have: Symptoms that do not get better with treatment. Pain or discomfort that gets worse. More frequent urges to urinate. A fever. Get help right away if: You have no control over when you urinate. Summary Interstitial cystitis is inflammation of the bladder. This condition may cause pain in the bladder area as well as a frequent and urgent need to urinate. You may have flare-ups of the condition, and then it may go away for a while. For many people, it becomes a long-term (chronic) problem. There is no cure for interstitial cystitis, but treatment methods are available to control your symptoms. This information is not intended to replace advice given  to you by your health care provider. Make sure you discuss any questions you have with your healthcare provider. Document Revised: 08/31/2019 Document Reviewed: 06/08/2017 Elsevier Patient Education  2021 [place].  Overactive Bladder, Adult  Overactive bladder is a condition in which a person has a sudden and frequent need to urinate. A person might also leak urine if he or she cannot get to the bathroom fast enough (urinary incontinence). Sometimes, symptoms can interfere with work or social activities. What are the causes? Overactive bladder is associated with poor nerve signals between your bladder and your brain. Your bladder may get the signal to empty before it is full. You may also have very sensitive muscles that make your bladder squeeze too soon. This condition may also be caused by other factors, such as: Medical conditions: Urinary tract infection. Infection of nearby tissues. Prostate enlargement. Bladder stones, inflammation, or tumors. Diabetes. Muscle or nerve weakness, especially from these conditions: A spinal cord injury. Stroke. Multiple sclerosis. Parkinson's disease. Other causes: Surgery on the uterus or urethra. Drinking too much caffeine or alcohol. Certain medicines, especially those that eliminate extra fluid in the body (diuretics). Constipation. What increases the risk? You may be at greater risk for overactive bladder if you: Are an older adult. Smoke. Are going through menopause. Have prostate problems. Have a neurological disease, such as stroke, dementia, Parkinson's disease, or multiple sclerosis (MS). Eat or drink alcohol, spicy food, caffeine, and other things that irritate  the bladder. Are overweight or obese. What are the signs or symptoms? Symptoms of this condition include a sudden, strong urge to urinate. Other symptoms include: Leaking urine. Urinating 8 or more times a day. Waking up to urinate 2 or more times overnight. How is  this diagnosed? This condition may be diagnosed based on: Your symptoms and medical history. A physical exam. Blood or urine tests to check for possible causes, such as infection. You may also need to see a health care provider who specializes in urinarytract problems. This is called a urologist. How is this treated? Treatment for overactive bladder depends on the cause of your condition and whether it is mild or severe. Treatment may include: Bladder training, such as: Learning to control the urge to urinate by following a schedule to urinate at regular intervals. Doing Kegel exercises to strengthen the pelvic floor muscles that support your bladder. Special devices, such as: Biofeedback. This uses sensors to help you become aware of your body's signals. Electrical stimulation. This uses electrodes placed inside the body (implanted) or outside the body. These electrodes send gentle pulses of electricity to strengthen the nerves or muscles that control the bladder. Women may use a plastic device, called a pessary, that fits into the vagina and supports the bladder. Medicines, such as: Antibiotics to treat bladder infection. Antispasmodics to stop the bladder from releasing urine at the wrong time. Tricyclic antidepressants to relax bladder muscles. Injections of botulinum toxin type A directly into the bladder tissue to relax bladder muscles. Surgery, such as: A device may be implanted to help manage the nerve signals that control urination. An electrode may be implanted to stimulate electrical signals in the bladder. A procedure may be done to change the shape of the bladder. This is done only in very severe cases. Follow these instructions at home: Eating and drinking  Make diet or lifestyle changes recommended by your health care provider. These may include: Drinking fluids throughout the day and not only with meals. Cutting down on caffeine or alcohol. Eating a healthy and balanced  diet to prevent constipation. This may include: Choosing foods that are high in fiber, such as beans, whole grains, and fresh fruits and vegetables. Limiting foods that are high in fat and processed sugars, such as fried and sweet foods.  Lifestyle  Lose weight if needed. Do not use any products that contain nicotine or tobacco. These include cigarettes, chewing tobacco, and vaping devices, such as e-cigarettes. If you need help quitting, ask your health care provider.  General instructions Take over-the-counter and prescription medicines only as told by your health care provider. If you were prescribed an antibiotic medicine, take it as told by your health care provider. Do not stop taking the antibiotic even if you start to feel better. Use any implants or pessary as told by your health care provider. If needed, wear pads to absorb urine leakage. Keep a log to track how much and when you drink, and when you need to urinate. This will help your health care provider monitor your condition. Keep all follow-up visits. This is important. Contact a health care provider if: You have a fever or chills. Your symptoms do not get better with treatment. Your pain and discomfort get worse. You have more frequent urges to urinate. Get help right away if: You are not able to control your bladder. Summary Overactive bladder refers to a condition in which a person has a sudden and frequent need to urinate. Several conditions may  lead to an overactive bladder. Treatment for overactive bladder depends on the cause and severity of your condition. Making lifestyle changes, doing Kegel exercises, keeping a log, and taking medicines can help with this condition. This information is not intended to replace advice given to you by your health care provider. Make sure you discuss any questions you have with your healthcare provider. Document Revised: 04/02/2020 Document Reviewed: 04/02/2020 Elsevier Patient  Education  [place].

## 2021-03-12 NOTE — Progress Notes (Signed)
03/12/21 11:54 AM   [name] [birth year] [id number]  CC: Urinary symptoms, flank pain, abdominal pain, history of urinary retention, history of IC  HPI: I saw Ms. Hammett in clinic today for the above issues.  She is a 50 year old female with significant anxiety and a very long history of urinary symptoms and abdominal/pelvic/back pain.  She reportedly was previously followed by Dr. Bernardo Heater, Dr. Ernst Spell, and Zara Council PA, but those records are unavailable to me.  She has also been seen at [facility] urology over 10 years ago.  She has a number of symptoms today including feeling like she has a UTI with lower pelvic cramping, burning with urination, and some right-sided flank pain.  She states significant weight fluctuation as well.  She also has urinary urgency and frequency.  She denies any gross hematuria.  Recent urine culture 03/01/2021 was negative, and urinalysis today is completely benign with 0-5 squamous cells, 0-5 WBCs, 0-5 RBCs, no bacteria, nitrite negative.  PVR is normal at 62 mL.  She drinks primarily water and tea during the day.  She had a CT stone protocol performed on 11/20/2020 that showed no hydronephrosis or renal stones, a right ovarian cyst that is chronic, and a possible 2 mm stone in the bladder.  She also reports a distant history of urinary retention for a few months after a complicated birth that required forceps, but no recent episodes of retention.  She reportedly has had cystoscopy and urodynamics previously, but those records are not available to me.  PMH: Past Medical History:  Diagnosis Date   Abnormal Pap smear of cervix    ADHD (attention deficit hyperactivity disorder)    Anxiety    Mitral valve prolapse    Spina bifida occulta    UTI (urinary tract infection)     Surgical History: Past Surgical History:  Procedure Laterality Date   LEEP     Family History: Family History  Problem Relation Age of Onset   Ovarian cancer Mother    Pancreatic  cancer Mother    Diabetes Mellitus II Mother    Cancer Father    Diabetes Mellitus II Father    Coronary artery disease Father    Anxiety disorder Sister    Mitral valve prolapse Sister    Drug abuse Daughter    Heart attack Sister     Social History:  reports that she has never smoked. She has never used smokeless tobacco. She reports that she does not currently use alcohol. She reports that she does not use drugs.  Physical Exam: BP 118/76   Pulse 74   Ht '5\' 6"'$  (1.676 m)   Wt 182 lb (82.6 kg)   BMI 29.38 kg/m    Constitutional:  Alert and oriented, No acute distress. Cardiovascular: No clubbing, cyanosis, or edema. Respiratory: Normal respiratory effort, no increased work of breathing. GI: Abdomen is soft, nontender, nondistended, no abdominal masses  Laboratory Data: Reviewed, see HPI  Pertinent Imaging: I have personally viewed and interpreted the CT showing a possible 2 mm stone at the base of the bladder, no other urologic abnormalities.  Assessment & Plan:   50 year old female with significant stress/anxiety and likely chronic bladder pain syndrome/interstitial cystitis.  Recent urine culture negative, urinalysis today completely benign, recent CT with no hydronephrosis or ureteral/renal stones, possible 2 mm bladder stone that likely has since passed spontaneously.  We discussed the complexities of pelvic pain and possible range of etiologies including pelvic floor dysfunction, chronic bladder pain syndrome,  and interstitial cystitis.  We reviewed the AUA guidelines that recommend an algorithmic approach to treatment for these patients, and that a trial of different medications and strategies is sometimes needed to find the approach that works best for each patient's unique situation.  I reinforced the importance of stress management, relaxation, avoiding triggers, and pain management in the approach to pelvic pain.  I had a very frank conversation with the patient that  she has no evidence of infection today, and no prior kidney stones or hydronephrosis on recent CT scan.  I think her stress/anxiety are contributing significantly to her pelvic pain and urinary symptoms.  I recommended starting with a trial of Myrbetriq 25 mg for her overactive symptoms, as well as amitriptyline nightly for an IC type picture, in addition to referral to pelvic floor physical therapy  RTC 3 months symptom check.  Consider cystoscopy in the future(she has significant anxiety with any procedures and would like to avoid cystoscopy at this time), she may also benefit from evaluation with Dr. Amalia Hailey IC specialist at [facility], or with Dr. Matilde Sprang  I spent 70 total minutes on the day of the encounter including pre-visit review of the medical record, face-to-face time with the patient, and post visit ordering of labs/imaging/tests.  Nickolas Madrid, MD 03/12/2021  [facility] [address] [place], [place] [address] [phone]

## 2021-03-15 ENCOUNTER — Telehealth: Payer: Self-pay

## 2021-03-15 NOTE — Telephone Encounter (Signed)
Pt Lm on triage line stating that the medication she was started on this week has not helped her symptoms.   Called pt back to inform her she will need to take the medication longer to see a difference however there was no answer. 1st attempt.

## 2021-03-15 NOTE — Telephone Encounter (Signed)
Advised patient she will need to take the medication longer to see a difference. Advised patient if she is having that much pain to go to urgent care or ER. She may try otc AZO. Offered her next available appt, patient stated she could not wait.

## 2021-03-27 DIAGNOSIS — L709 Acne, unspecified: Secondary | ICD-10-CM | POA: Diagnosis not present

## 2021-03-27 DIAGNOSIS — J302 Other seasonal allergic rhinitis: Secondary | ICD-10-CM | POA: Diagnosis not present

## 2021-03-27 DIAGNOSIS — F419 Anxiety disorder, unspecified: Secondary | ICD-10-CM | POA: Diagnosis not present

## 2021-03-27 DIAGNOSIS — N3281 Overactive bladder: Secondary | ICD-10-CM | POA: Diagnosis not present

## 2021-03-27 DIAGNOSIS — I10 Essential (primary) hypertension: Secondary | ICD-10-CM | POA: Diagnosis not present

## 2021-03-27 DIAGNOSIS — E785 Hyperlipidemia, unspecified: Secondary | ICD-10-CM | POA: Diagnosis not present

## 2021-03-27 DIAGNOSIS — F32A Depression, unspecified: Secondary | ICD-10-CM | POA: Diagnosis not present

## 2021-03-27 DIAGNOSIS — Z7185 Encounter for immunization safety counseling: Secondary | ICD-10-CM | POA: Diagnosis not present

## 2021-03-27 DIAGNOSIS — F902 Attention-deficit hyperactivity disorder, combined type: Secondary | ICD-10-CM | POA: Diagnosis not present

## 2021-03-27 DIAGNOSIS — B009 Herpesviral infection, unspecified: Secondary | ICD-10-CM | POA: Diagnosis not present

## 2021-04-03 ENCOUNTER — Other Ambulatory Visit: Payer: Self-pay

## 2021-04-03 ENCOUNTER — Other Ambulatory Visit: Payer: Self-pay | Admitting: Urology

## 2021-04-03 ENCOUNTER — Ambulatory Visit: Payer: Medicaid Other | Admitting: Obstetrics and Gynecology

## 2021-04-03 ENCOUNTER — Encounter: Payer: Self-pay | Admitting: Obstetrics and Gynecology

## 2021-04-03 VITALS — BP 130/85 | HR 85 | Resp 16 | Ht 66.0 in | Wt 185.4 lb

## 2021-04-03 DIAGNOSIS — N3090 Cystitis, unspecified without hematuria: Secondary | ICD-10-CM | POA: Diagnosis not present

## 2021-04-03 DIAGNOSIS — N83201 Unspecified ovarian cyst, right side: Secondary | ICD-10-CM | POA: Diagnosis not present

## 2021-04-03 DIAGNOSIS — R1030 Lower abdominal pain, unspecified: Secondary | ICD-10-CM

## 2021-04-03 LAB — POCT URINALYSIS DIPSTICK
Bilirubin, UA: NEGATIVE
Glucose, UA: NEGATIVE
Ketones, UA: NEGATIVE
Leukocytes, UA: NEGATIVE
Nitrite, UA: NEGATIVE
Protein, UA: NEGATIVE
Spec Grav, UA: 1.01 (ref 1.010–1.025)
Urobilinogen, UA: 0.2 E.U./dL
pH, UA: 6 (ref 5.0–8.0)

## 2021-04-03 MED ORDER — CLINDAMYCIN HCL 300 MG PO CAPS: 300.0000 mg | ORAL_CAPSULE | Freq: Three times a day (TID) | ORAL | 0 refills | Status: DC

## 2021-04-03 NOTE — Progress Notes (Signed)
GYNECOLOGY PROGRESS NOTE  Subjective:    Patient ID: Shirley [name], female    DOB: [birth year], 50 y.o.   MRN: [id number]  HPI  Patient is a 50 y.o. G28P2002 female who presents for discussion of management of her right ovarian cyst. Patient has had ovarian cyst for at least 5 years, slow growing (~ 2 cm - 5 cm currently).  Patient notes that she constantly worries that it will become cancerous or cause torsion as she has been reading on the Internet. Has had testing on cyst in the past, ruled out for malignancy. Desires to discuss surgery as she still notes intermittent pelvic pain.   She also reports recent diagnosis of cystitis. Has been seen in the ER at [facility] and [facility] in the past few weeks.  She reports that she was started on Clindamycin (only antibiotic she notes she can take). Did not complete course. Desires refill as she plans to go out of town next week on vacation.    The following portions of the patient's history were reviewed and updated as appropriate: allergies, current medications, past family history, past medical history, past social history, past surgical history, and problem list.  Review of Systems Pertinent items noted in HPI and remainder of comprehensive ROS otherwise negative.   Objective:   Blood pressure 130/85, pulse 85, resp. rate 16, height '5\' 6"'$  (1.676 m), weight 185 lb 6.4 oz (84.1 kg). Body mass index is 29.92 kg/m. General appearance: alert and no distress Abdomen: soft, non-tender; bowel sounds normal; no masses,  no organomegaly Pelvic: deferred.    Labs:  Results for orders placed or performed in visit on 04/03/21  POCT Urinalysis Dipstick  Result Value Ref Range   Color, UA     Clarity, UA     Glucose, UA Negative Negative   Bilirubin, UA Negative    Ketones, UA Negative    Spec Grav, UA 1.010 1.010 - 1.025   Blood, UA Trace    pH, UA 6.0 5.0 - 8.0   Protein, UA Negative Negative   Urobilinogen, UA 0.2 0.2 or 1.0 E.U./dL    Nitrite, UA Negative    Leukocytes, UA Negative Negative   Appearance     Odor       Imaging:  Patient Name: Shirley [name] DOB: [birth year] MRN: [id number] ULTRASOUND REPORT   Location: [facility] Date of Service: 09/12/2020        Indications:Pelvic Pain    Findings:  The uterus is anteverted and measures 8.1 x 3.9 x 4.7. Echo texture is homogenous without evidence of focal masses.   The Endometrium measures 5 mm.   Right Ovary measures 6.7 x 5.0 x 5.6 cm. It is normal in appearance. Hypoechoic lesion with smooth walls and good through transmission measuring 5.0 x 4.3 x 4.2 cm (minimal change from prior imaging of cyst in 2019).  Left Ovary measures 3.0 x 3.0 x 2.2 cm. It is normal in appearance. Survey of the adnexa demonstrates no adnexal masses. There is no free fluid in the cul de sac.   Impression: 1. Rt ovarian simple cyst as described above.   Recommendations: 1.Clinical correlation with the patient's History and Physical Exam.     Jenine M. Albertine Grates    RDMS     I have reviewed this study and agree with documented findings.     Assessment:   1. Cyst of right ovary   2. Lower abdominal pain   3. Cystitis  Plan:   Cysts of right ovary - patient notes that she desires surgery, however is very anxious to be put to sleep.  Discussed nature of laparoscopic surgery, would require general anesthesia. Desires to wait until January to do surgery to gather up the nerve.  Lower abdominal pain, can continue to use NSAIDs or Tylenol as preferred.  Cystitis - reviewed records from recent Urgent Care visit.  Patient with trace blood and small leukocytes at that time. Urine culture noted 10-50K of GBS. Today's UA is negative. Patient still noting some dysuria.  Does not respond well to AZO, Uribel.  Patient has had hematuria and bladder symptoms for several years, also with remote h/o IC, advised several times to be assessed by Urology, has been seen, advised to  begin Myrbetric and trial of Amtriptyline, (does not sound like she has initiated yet).  Recommended cystoscopy but patient is terrified of procedures. If she proceeds with removal of ovarian cyst, can coordinate with Urology to have cystoscopy performed at same time.    A total of 25 minutes were spent face-to-face with the patient during this encounter and over half of that time involved counseling and coordination of care.   Rubie Maid, MD [facility]

## 2021-04-03 NOTE — Patient Instructions (Signed)
Diagnostic Laparoscopy Diagnostic laparoscopy is a procedure to diagnose problems in the abdomen. It might be done for a variety of reasons, such as to look for scar tissue, a reason for abdominal pain, an abdominal mass or tumor, or fluid in the abdomen (ascites). This procedure may also be done to remove a tissue sample from the liver to look at under a microscope (biopsy). During the procedure, a thin, flexible tube that has a light and a camera on the end (laparoscope) is inserted through a small incision in the abdomen. The image from the camera is shown on a monitor to help the surgeon see inside the body. Tell a health care provider about: Any allergies you have. All medicines you are taking, including vitamins, herbs, eye drops, creams, and over-the-counter medicines. Any problems you or family members have had with anesthetic medicines. Any blood disorders you have. Any surgeries you have had. Any medical conditions you have. Whether you are pregnant or may be pregnant. What are the risks? Generally, this is a safe procedure. However, problems may occur, including: Infection. Bleeding. Allergic reactions to medicines or dyes. Damage to abdominal structures or organs, such as the intestines, liver, stomach, or spleen. What happens before the procedure? Staying hydrated Follow instructions from your health care provider about hydration, which may include: Up to 2 hours before the procedure - you may continue to drink clear liquids, such as water, clear fruit juice, black coffee, and plain tea.  Eating and drinking restrictions Follow instructions from your health care provider about eating and drinking, which may include: 8 hours before the procedure - stop eating heavy meals or foods, such as meat, fried foods, or fatty foods. 6 hours before the procedure - stop eating light meals or foods, such as toast or cereal. 6 hours before the procedure - stop drinking milk or drinks that  contain milk. 2 hours before the procedure - stop drinking clear liquids. Medicines Ask your health care provider about: Changing or stopping your regular medicines. This is especially important if you are taking diabetes medicines or blood thinners. Taking medicines such as aspirin and ibuprofen. These medicines can thin your blood. Do not take these medicines unless your health care provider tells you to take them. Taking over-the-counter medicines, vitamins, herbs, and supplements. General instructions Ask your health care provider: How your surgery site will be marked. What steps will be taken to help prevent infection. These steps may include: Removing hair at the surgery site. Washing skin with a germ-killing soap. Taking antibiotic medicine. Plan to have a responsible adult take you home from the hospital or clinic. Plan to have a responsible adult care for you for the time you are told after you leave the hospital or clinic. This is important. What happens during the procedure?  An IV will be inserted into one of your veins. You will be given one or more of the following: A medicine to help you relax (sedative). A medicine to numb the area (local anesthetic). A medicine to make you fall asleep (general anesthetic). A breathing tube will be placed down your throat to help you breathe during the procedure. Your abdomen will be filled with an air-like gas so that your abdomen expands. This will give the surgeon more room to operate and will make your organs easier to see. Many small incisions will be made in your abdomen. A laparoscope and other surgical instruments will be inserted into your abdomen through these incisions. A biopsy may be done.   This will depend on the reason why you are having this procedure. The laparoscope and other instruments will be removed from your abdomen. The air-like gas will be released from your abdomen. Your incisions will be closed with stitches  (sutures), skin glue, or surgical tapes and covered with a bandage (dressing). Your breathing tube will be removed. The procedure may vary among health care providers and hospitals. What happens after the procedure? Your blood pressure, heart rate, breathing rate, and blood oxygen level will be monitored until you leave the hospital or clinic. If you were given a sedative during the procedure, it can affect you for several hours. Do not drive or operate machinery until your health care provider says that it is safe. It is up to you to get the results of your procedure. Ask your health care provider, or the department that is doing the procedure, when your results will be ready. Summary Diagnostic laparoscopy is a procedure to diagnose problems in the abdomen using a thin, flexible tube that has a light and a camera on the end (laparoscope). Follow instructions from your health care provider about how to prepare for the procedure. Plan to have a responsible adult care for you for the time you are told after you leave the hospital or clinic. This is important. This information is not intended to replace advice given to you by your health care provider. Make sure you discuss any questions you have with your health care provider. Document Revised: 03/09/2020 Document Reviewed: 03/09/2020 Elsevier Patient Education  2022 [employer].

## 2021-04-08 ENCOUNTER — Ambulatory Visit: Payer: Medicaid Other | Attending: Urology | Admitting: Physical Therapy

## 2021-04-08 DIAGNOSIS — H109 Unspecified conjunctivitis: Secondary | ICD-10-CM | POA: Diagnosis not present

## 2021-04-08 DIAGNOSIS — R8271 Bacteriuria: Secondary | ICD-10-CM | POA: Diagnosis not present

## 2021-04-17 DIAGNOSIS — J302 Other seasonal allergic rhinitis: Secondary | ICD-10-CM | POA: Diagnosis not present

## 2021-04-17 DIAGNOSIS — R21 Rash and other nonspecific skin eruption: Secondary | ICD-10-CM | POA: Diagnosis not present

## 2021-04-17 DIAGNOSIS — R490 Dysphonia: Secondary | ICD-10-CM | POA: Diagnosis not present

## 2021-04-17 DIAGNOSIS — B009 Herpesviral infection, unspecified: Secondary | ICD-10-CM | POA: Diagnosis not present

## 2021-05-14 DIAGNOSIS — Z113 Encounter for screening for infections with a predominantly sexual mode of transmission: Secondary | ICD-10-CM | POA: Diagnosis not present

## 2021-05-14 DIAGNOSIS — R059 Cough, unspecified: Secondary | ICD-10-CM | POA: Diagnosis not present

## 2021-05-14 DIAGNOSIS — N3011 Interstitial cystitis (chronic) with hematuria: Secondary | ICD-10-CM | POA: Diagnosis not present

## 2021-05-14 DIAGNOSIS — R490 Dysphonia: Secondary | ICD-10-CM | POA: Diagnosis not present

## 2021-05-14 DIAGNOSIS — L659 Nonscarring hair loss, unspecified: Secondary | ICD-10-CM | POA: Diagnosis not present

## 2021-05-14 DIAGNOSIS — N83201 Unspecified ovarian cyst, right side: Secondary | ICD-10-CM | POA: Diagnosis not present

## 2021-05-14 DIAGNOSIS — J302 Other seasonal allergic rhinitis: Secondary | ICD-10-CM | POA: Diagnosis not present

## 2021-05-23 DIAGNOSIS — R300 Dysuria: Secondary | ICD-10-CM | POA: Diagnosis not present

## 2021-05-27 ENCOUNTER — Other Ambulatory Visit: Payer: Self-pay

## 2021-05-27 LAB — URINALYSIS, COMPLETE (UACMP) WITH MICROSCOPIC
Bilirubin Urine: NEGATIVE
Glucose, UA: NEGATIVE mg/dL
Ketones, ur: NEGATIVE mg/dL
Leukocytes,Ua: NEGATIVE
Nitrite: NEGATIVE
Protein, ur: NEGATIVE mg/dL
Specific Gravity, Urine: <1.005 — ABNORMAL LOW (ref 1.005–1.030)
pH: 6 (ref 5.0–8.0)

## 2021-06-02 DIAGNOSIS — Z882 Allergy status to sulfonamides status: Secondary | ICD-10-CM | POA: Diagnosis not present

## 2021-06-02 DIAGNOSIS — N3289 Other specified disorders of bladder: Secondary | ICD-10-CM | POA: Diagnosis not present

## 2021-06-02 DIAGNOSIS — R319 Hematuria, unspecified: Secondary | ICD-10-CM | POA: Diagnosis not present

## 2021-06-02 DIAGNOSIS — M7989 Other specified soft tissue disorders: Secondary | ICD-10-CM | POA: Diagnosis not present

## 2021-06-02 DIAGNOSIS — Z20822 Contact with and (suspected) exposure to covid-19: Secondary | ICD-10-CM | POA: Diagnosis not present

## 2021-06-02 DIAGNOSIS — I10 Essential (primary) hypertension: Secondary | ICD-10-CM | POA: Diagnosis not present

## 2021-06-02 DIAGNOSIS — Z7951 Long term (current) use of inhaled steroids: Secondary | ICD-10-CM | POA: Diagnosis not present

## 2021-06-02 DIAGNOSIS — Z880 Allergy status to penicillin: Secondary | ICD-10-CM | POA: Diagnosis not present

## 2021-06-02 DIAGNOSIS — Z792 Long term (current) use of antibiotics: Secondary | ICD-10-CM | POA: Diagnosis not present

## 2021-06-02 DIAGNOSIS — F419 Anxiety disorder, unspecified: Secondary | ICD-10-CM | POA: Diagnosis not present

## 2021-06-02 DIAGNOSIS — R300 Dysuria: Secondary | ICD-10-CM | POA: Diagnosis not present

## 2021-06-02 DIAGNOSIS — Z881 Allergy status to other antibiotic agents status: Secondary | ICD-10-CM | POA: Diagnosis not present

## 2021-06-02 DIAGNOSIS — Z79899 Other long term (current) drug therapy: Secondary | ICD-10-CM | POA: Diagnosis not present

## 2021-06-02 DIAGNOSIS — F909 Attention-deficit hyperactivity disorder, unspecified type: Secondary | ICD-10-CM | POA: Diagnosis not present

## 2021-06-03 ENCOUNTER — Telehealth: Payer: Self-pay

## 2021-06-03 NOTE — Telephone Encounter (Signed)
Pt called back and asked some more questions about why I was calling and who I worked for. I informed pt that I was with [facility] and I was calling to check on her after her ED discharge on 06/02/2021. Pt stated that she was concerned that her medical records were being access illegally and that she is contacting someone about it. I stated that I understood her concerns and reassured her that I was with [facility]. Pt thanked me for my time.

## 2021-06-03 NOTE — Telephone Encounter (Signed)
Transition Care Management Follow-up Telephone Call Date of discharge and from where: 06/02/2021 from [facility].  How have you been since you were released from the hospital? Pt stated that she is not feeling any better. See below note.   Pt refused to have a conversation with me due to "illegally having medical record pulled". Pt was not sure why we called her since she is not seen by our Health System.

## 2021-06-10 ENCOUNTER — Encounter: Payer: Self-pay | Admitting: Urology

## 2021-06-10 ENCOUNTER — Other Ambulatory Visit
Admission: RE | Admit: 2021-06-10 | Discharge: 2021-06-10 | Disposition: A | Discharge status: Home / Self Care | Payer: Medicaid Other | Attending: Urology | Admitting: Urology

## 2021-06-10 ENCOUNTER — Other Ambulatory Visit: Payer: Self-pay

## 2021-06-10 ENCOUNTER — Other Ambulatory Visit: Payer: Self-pay | Admitting: *Deleted

## 2021-06-10 ENCOUNTER — Ambulatory Visit: Payer: Medicaid Other | Admitting: Urology

## 2021-06-10 VITALS — BP 133/79 | HR 80 | Ht 66.0 in | Wt 185.0 lb

## 2021-06-10 DIAGNOSIS — R350 Frequency of micturition: Secondary | ICD-10-CM | POA: Insufficient documentation

## 2021-06-10 DIAGNOSIS — N3010 Interstitial cystitis (chronic) without hematuria: Secondary | ICD-10-CM | POA: Diagnosis not present

## 2021-06-10 DIAGNOSIS — R109 Unspecified abdominal pain: Secondary | ICD-10-CM

## 2021-06-10 DIAGNOSIS — N3281 Overactive bladder: Secondary | ICD-10-CM | POA: Diagnosis not present

## 2021-06-10 DIAGNOSIS — N390 Urinary tract infection, site not specified: Secondary | ICD-10-CM | POA: Diagnosis not present

## 2021-06-10 DIAGNOSIS — R300 Dysuria: Secondary | ICD-10-CM | POA: Diagnosis not present

## 2021-06-10 LAB — URINALYSIS, COMPLETE (UACMP) WITH MICROSCOPIC
Bacteria, UA: NONE SEEN
Bilirubin Urine: NEGATIVE
Glucose, UA: NEGATIVE mg/dL
Ketones, ur: NEGATIVE mg/dL
Leukocytes,Ua: NEGATIVE
Nitrite: NEGATIVE
Protein, ur: NEGATIVE mg/dL
Specific Gravity, Urine: <1.005 — ABNORMAL LOW (ref 1.005–1.030)
pH: 5.5 (ref 5.0–8.0)

## 2021-06-10 LAB — BLADDER SCAN AMB NON-IMAGING

## 2021-06-10 MED ORDER — MIRABEGRON ER 25 MG PO TB24: 25.0000 mg | ORAL_TABLET | Freq: Every day | ORAL | 0 refills | Status: DC

## 2021-06-10 MED ORDER — GEMTESA 75 MG PO TABS: 75.0000 mg | ORAL_TABLET | Freq: Every day | ORAL | 0 refills | Status: DC

## 2021-06-10 NOTE — Progress Notes (Signed)
06/10/2021 3:35 PM   Shirley [name] 01-12-1971 [id number]  Referring provider: Care, [facility],  [place] [id number]  Chief Complaint  Patient presents with   Follow-up   Hematuria   Urological history: 1. High risk hematuria -non-smoker -work up in 2009 - NED -reports of gross heme -UA negative for micro heme  2. Urinary retention -occurred after both pregnancies ~ 17 years ago  3. Interstitial cystitis -per patient -manage with diet  HPI: Shirley [name] is a 50 y.o. female who presents today with symptoms of burning with urination and blood in the urine.  UA benign  PVR 33 mL    She states that she has had a week of dysuria, urgency and frequency and tinges of blood on the toilet paper when she wipes.  She also has been experiencing bilateral flank pain, pelvic pain and bilateral foot pain.  Patient denies any modifying or aggravating factors.  Patient denies any gross hematuria or suprapubic/flank pain.  Patient denies any fevers, chills, nausea or vomiting.    PMH: Past Medical History:  Diagnosis Date   Abnormal Pap smear of cervix    ADHD (attention deficit hyperactivity disorder)    Anxiety    Mitral valve prolapse    Spina bifida occulta    UTI (urinary tract infection)     Surgical History: Past Surgical History:  Procedure Laterality Date   LEEP      Home Medications:  Allergies as of 06/10/2021       Reactions   Bactrim [sulfamethoxazole-trimethoprim] Anaphylaxis   Gadolinium Anaphylaxis   Gadolinium Derivatives Anaphylaxis   Iodinated Diagnostic Agents Anaphylaxis   Iodine Anaphylaxis   Other Nausea And Vomiting, Rash, Hives   Uncoded Allergy. Allergen: BEZONZTATE Uncoded Allergy. Allergen: decongestants   Sulfa Antibiotics Anaphylaxis, Other (See Comments)   Uncoded Allergy. Allergen: decongestants Uncoded Allergy. Allergen: decongestants   Azithromycin Hives   Clarithromycin Other (See Comments)   Furosemide  Other (See Comments)   Guaifenesin Nausea And Vomiting   Macrobid [nitrofurantoin Monohyd Macro] Swelling   Tongue swelling, chest tightness    Nitrofurantoin Hives   Penicillins Hives   Amoxicillin Rash   Doxycycline Rash, Other (See Comments)   Keflex [cephalexin] Rash, Other (See Comments)   Levofloxacin Rash        Medication List        Accurate as of June 10, 2021 11:59 PM. If you have any questions, ask your nurse or doctor.          albuterol 108 (90 Base) MCG/ACT inhaler Commonly known as: VENTOLIN HFA Inhale into the lungs every 6 (six) hours as needed for wheezing or shortness of breath.   ALPRAZolam 1 MG tablet Commonly known as: XANAX Take 1 mg by mouth 3 (three) times daily as needed for anxiety.   amitriptyline 50 MG tablet Commonly known as: ELAVIL Take 1 tablet (50 mg total) by mouth at bedtime.   amphetamine-dextroamphetamine 10 MG tablet Commonly known as: ADDERALL Take 10 mg by mouth 2 (two) times daily.   cetirizine 10 MG tablet Commonly known as: ZYRTEC Take 1 tablet (10 mg total) by mouth daily.   citalopram 20 MG tablet Commonly known as: CELEXA Take 20 mg by mouth daily.   clindamycin 300 MG capsule Commonly known as: CLEOCIN Take 1 capsule (300 mg total) by mouth 3 (three) times daily.   fluticasone 50 MCG/ACT nasal spray Commonly known as: FLONASE SPRAY 2 SPRAYS INTO EACH NOSTRIL  EVERY DAY   fosfomycin 3 g Pack Commonly known as: MONUROL TAKE 3 G BY MOUTH ONCE FOR 1 DOSE.   Gemtesa 75 MG Tabs Generic drug: Vibegron Take 75 mg by mouth daily. Started by: Zara Council, PA-C   hydrochlorothiazide 12.5 MG tablet Commonly known as: HYDRODIURIL Take 12.5 mg by mouth daily.   lisinopril 10 MG tablet Commonly known as: ZESTRIL Take 10 mg by mouth daily.   phenazopyridine 200 MG tablet Commonly known as: PYRIDIUM Take 1 tablet (200 mg total) by mouth 3 (three) times daily.   Prempro 0.45-1.5 MG tablet Generic drug:  estrogen (conjugated)-medroxyprogesterone Take 1 tablet by mouth daily.   tamsulosin 0.4 MG Caps capsule Commonly known as: FLOMAX Take 1 capsule (0.4 mg total) by mouth daily after supper.   VITAMIN D PO Take 1 capsule by mouth in the morning and at bedtime.        Allergies:  Allergies  Allergen Reactions   Bactrim [Sulfamethoxazole-Trimethoprim] Anaphylaxis   Gadolinium Anaphylaxis   Gadolinium Derivatives Anaphylaxis   Iodinated Diagnostic Agents Anaphylaxis   Iodine Anaphylaxis   Other Nausea And Vomiting, Rash and Hives    Uncoded Allergy. Allergen: BEZONZTATE Uncoded Allergy. Allergen: decongestants   Sulfa Antibiotics Anaphylaxis and Other (See Comments)    Uncoded Allergy. Allergen: decongestants Uncoded Allergy. Allergen: decongestants    Azithromycin Hives   Clarithromycin Other (See Comments)   Furosemide Other (See Comments)   Guaifenesin Nausea And Vomiting   Macrobid [Nitrofurantoin Monohyd Macro] Swelling    Tongue swelling, chest tightness    Nitrofurantoin Hives   Penicillins Hives   Amoxicillin Rash   Doxycycline Rash and Other (See Comments)   Keflex [Cephalexin] Rash and Other (See Comments)   Levofloxacin Rash    Family History: Family History  Problem Relation Age of Onset   Ovarian cancer Mother    Pancreatic cancer Mother    Diabetes Mellitus II Mother    Cancer Father    Diabetes Mellitus II Father    Coronary artery disease Father    Anxiety disorder Sister    Mitral valve prolapse Sister    Drug abuse Daughter    Heart attack Sister     Social History:  reports that she has never smoked. She has never used smokeless tobacco. She reports that she does not currently use alcohol. She reports that she does not use drugs.  ROS: Pertinent ROS in HPI  Physical Exam: BP 133/79   Pulse 80   Ht '5\' 6"'  (1.676 m)   Wt 185 lb (83.9 kg)   BMI 29.86 kg/m   Constitutional:  Well nourished. Alert and oriented, No acute distress. HEENT:  [place] AT, mask in place.  Trachea midline Cardiovascular: No clubbing, cyanosis, or edema. Respiratory: Normal respiratory effort, no increased work of breathing. Neurologic: Grossly intact, no focal deficits, moving all 4 extremities. Psychiatric: Normal mood and affect.    Laboratory Data: WBC 3.6 - 11.2 10*9/L 6.7   RBC 3.95 - 5.13 10*12/L 4.41   HGB 11.3 - 14.9 g/dL 12.6   HCT 34.0 - 44.0 % 37.3   MCV 77.6 - 95.7 fL 84.6   MCH 25.9 - 32.4 pg 28.7   MCHC 32.0 - 36.0 g/dL 33.9   RDW 12.2 - 15.2 % 13.6   MPV 6.8 - 10.7 fL 8.4   Platelet 150 - 450 10*9/L 269   nRBC <=4 /100 WBCs 0   Neutrophils % % 55.7   Lymphocytes % % 30.2   Monocytes % %  9.6   Eosinophils % % 3.5   Basophils % % 1.0   Absolute Neutrophils 1.8 - 7.8 10*9/L 3.8   Absolute Lymphocytes 1.1 - 3.6 10*9/L 2.0   Absolute Monocytes 0.3 - 0.8 10*9/L 0.6   Absolute Eosinophils 0.0 - 0.5 10*9/L 0.2   Absolute Basophils 0.0 - 0.1 10*9/L 0.1   Resulting Agency  [facility]  Specimen Collected: 06/02/21 12:48 Last Resulted: 06/02/21 13:23  Received From: [facility]  Result Received: 06/10/21 08:56   Sodium 135 - 145 mmol/L 142   Potassium 3.4 - 4.8 mmol/L 4.0   Chloride 98 - 107 mmol/L 107   CO2 20.0 - 31.0 mmol/L 26.1   Anion Gap 5 - 14 mmol/L 9   BUN 9 - 23 mg/dL 6 Low    Creatinine 0.60 - 0.80 mg/dL 0.67   BUN/Creatinine Ratio  9   eGFR CKD-EPI (2021) Female >=60 mL/min/1.29m >90   Comment: eGFR calculated with CKD-EPI 2021 equation in accordance with [employer] [place] [employer] Ne[employer] Task Force recommendations.  Glucose 70 - 179 mg/dL 81   Calcium 8.7 - 10.4 mg/dL 9.4   Albumin 3.4 - 5.0 g/dL 4.2   Total Protein 5.7 - 8.2 g/dL 6.8   Total Bilirubin 0.3 - 1.2 mg/dL 0.3   AST <=34 U/L 22   ALT 10 - 49 U/L 19   Alkaline Phosphatase 46 - 116 U/L 112   [facility]  Specimen Collected: 06/02/21 12:48 Last Resulted: 06/02/21 13:43   [facility] Result Received: 06/10/21 08:56   Color, UA  Light Yellow   Clarity, UA  Clear   Specific Gravity, UA 1.003 - 1.030 1.012   pH, UA 5.0 - 9.0 5.5   Leukocyte Esterase, UA Negative Negative   Nitrite, UA Negative Negative   Protein, UA Negative Negative   Glucose, UA Negative Negative   Ketones, UA Negative Negative   Urobilinogen, UA <2.0 mg/dL <2.0 mg/dL   Bilirubin, UA Negative Negative   Blood, UA Negative Trace Abnormal    RBC, UA <=4 /HPF 1   WBC, UA 0 - 5 /HPF <1   Squam Epithel, UA 0 - 5 /HPF <1   Bacteria, UA None Seen /HPF None Seen   Mucus, UA None Seen /HPF Rare Abnormal    [facility]  Specimen Collected: 06/02/21 12:48 Last Resulted: 06/02/21 13:26  Received From: [facility] Result Received: 06/10/21 08:56   Urinalysis Component     Latest Ref Rng & Units 06/10/2021          Color, Urine     YELLOW YELLOW  Appearance     CLEAR CLEAR  Specific Gravity, Urine     1.005 - 1.030 <1.005 (L)  pH     5.0 - 8.0 5.5  Glucose, UA     NEGATIVE mg/dL NEGATIVE  Hgb urine dipstick     NEGATIVE TRACE (A)  Bilirubin Urine     NEGATIVE NEGATIVE  Ketones, ur     NEGATIVE mg/dL NEGATIVE  Protein     NEGATIVE mg/dL NEGATIVE  Nitrite     NEGATIVE NEGATIVE  Leukocytes,Ua     NEGATIVE NEGATIVE  Squamous Epithelial / LPF     0 - 5 0-5  WBC, UA     0 - 5 WBC/hpf 0-5  RBC / HPF     0 - 5 RBC/hpf 0-5  Bacteria, UA  NONE SEEN NONE SEEN  I have reviewed the labs.   Pertinent Imaging: CLINICAL DATA:  Lower back pain x3 weeks history of frequent UTIs   EXAM: CT ABDOMEN AND PELVIS WITHOUT CONTRAST   TECHNIQUE: Multidetector CT imaging of the abdomen and pelvis was performed following the standard protocol without IV contrast.   COMPARISON:  CT June 04, 2017   FINDINGS: Lower chest: No acute abnormality.   Hepatobiliary: Unremarkable noncontrast appearance of the hepatic parenchyma  gallbladder is decompressed otherwise unremarkable. No biliary ductal dilation.   Pancreas: Within normal limits.   Spleen: Within normal limits.   Adrenals/Urinary Tract: Bilateral adrenal glands are unremarkable. No hydronephrosis. No contour deforming renal masses. No renal or ureteral calculi visualized. 2 mm stone in the bladder on image 72/5.   Stomach/Bowel: Stomach is within normal limits. Appendix is not definitely visualized however there is no pericecal inflammation. No evidence of bowel wall thickening, distention, or inflammatory changes.   Vascular/Lymphatic: No significant vascular findings are present. No enlarged abdominal or pelvic lymph nodes.   Reproductive: Right ovarian cyst measuring 5.7 cm, similar in size to pelvic ultrasound September 12, 2020. Left ovary is unremarkable.   Other: No abdominopelvic ascites.   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 No acute osseous abnormality.   IMPRESSION: 1. 2 mm stone in the bladder. No hydronephrosis. No renal or ureteral calculi visualized 2. Right ovarian cyst measuring 5.7 cm, similar in size to pelvic ultrasound September 12, 2020. Follow-up by [place] is recommended in 3-6 months. Note: This recommendation does not apply to premenarchal patients and to those with increased risk (genetic, family history, elevated tumor markers or other high-risk factors) of ovarian cancer. Reference: JACR 2020 Feb; 17(2):248-254     Electronically Signed   By: Dahlia Bailiff MD   On: 11/20/2020 18:23 Results for Shirley [name], Shirley [name] (MRN [id number]) as of 06/10/2021 15:47  Ref. Range 06/10/2021 14:23  Scan Result Unknown 51m    I have independently reviewed the films.  See HPI.    Assessment & Plan:    1. Gross hematuria - I explained to the patient that there are a number of causes that can be associated with blood in the  urine, such as stones, UTI's, damage to the urinary tract and/or cancer. -at this time, they are in a high risk stratification for hematuria per AUA guidelines due to gross heme  -recommended studies for high risk are CT urogram and cysto, but she experiences anaphylactic reaction with seafood so she does not want to pursue a CT scan with contrast at this time - Following the imaging study,  I've recommended a cystoscopy. I described how this is performed, typically in an office setting with a flexible cystoscope. We described the risks, benefits, and possible side effects, the most common of which is a minor amount of blood in the urine and/or burning which usually resolves in 24 to 48 hours.   - The patient had the opportunity to ask questions which were answered. Based upon this discussion, the patient is willing to proceed. Therefore, I've ordered: a CT Renal stone study and cystoscopy. - The patient will return following all of the above for discussion of the results.  - UA - Urine culture    2. Feelings of incomplete bladder emptying -Try to explained to the patient that she is emptying her bladder as her bladder scan from her visit in August and also  her bladder scan on her visit today noted a minimal PVR, but she was not satisfied with this explanation -gave her Gemtesa 75 mg, # 28 samples for possible OAB  3. Dysuria -UA benign -urine sent for culture -Explained that at the present time her urinalysis did not note any infection and that I need to send it for culture for further evaluation and that it was not appropriate to prescribe an antibiotic at this time, she was not satisfied with this plan  At checkout, she she said she was just going to seek treatment in the ED as she states that she could be treated right away for her condition  Return for CT report and cysto for gross hemautria .  These notes generated with voice recognition software. I apologize for typographical  errors.  Zara Council, PA-C  [facility] [address]  [place], [place] [address] [phone]

## 2021-06-12 LAB — URINE CULTURE: Culture: NO GROWTH

## 2021-06-19 NOTE — Progress Notes (Deleted)
06/24/2021 3:41 PM   Shirley [name] August 20, 1970 [id number]  Referring provider: Care, [facility],  [address] [address]  No chief complaint on file.  Urological history: 1. High risk hematuria -non-smoker -work up in 2009 - NED -reports of gross heme -UA negative for micro heme  2. Urinary retention -occurred after both pregnancies ~ 17 years ago  3. Interstitial cystitis -per patient -manage with diet  HPI: Shirley [name] is a 50 y.o. female who presents today for follow up after a trial of Gemtesa for OAB.   She was seen three weeks ago for feelings of incomplete bladder emptying, frequency and urgency.  She was also seeing blood in her toilet paper when she wiped.   Her hematuria work up in pending.  PVR ***  PMH: Past Medical History:  Diagnosis Date   Abnormal Pap smear of cervix    ADHD (attention deficit hyperactivity disorder)    Anxiety    Mitral valve prolapse    Spina bifida occulta    UTI (urinary tract infection)     Surgical History: Past Surgical History:  Procedure Laterality Date   LEEP      Home Medications:  Allergies as of 06/24/2021       Reactions   Bactrim [sulfamethoxazole-trimethoprim] Anaphylaxis   Gadolinium Anaphylaxis   Gadolinium Derivatives Anaphylaxis   Iodinated Diagnostic Agents Anaphylaxis   Iodine Anaphylaxis   Other Nausea And Vomiting, Rash, Hives   Uncoded Allergy. Allergen: BEZONZTATE Uncoded Allergy. Allergen: decongestants   Sulfa Antibiotics Anaphylaxis, Other (See Comments)   Uncoded Allergy. Allergen: decongestants Uncoded Allergy. Allergen: decongestants   Azithromycin Hives   Clarithromycin Other (See Comments)   Furosemide Other (See Comments)   Guaifenesin Nausea And Vomiting   Macrobid [nitrofurantoin Monohyd Macro] Swelling   Tongue swelling, chest tightness    Nitrofurantoin Hives   Penicillins Hives   Amoxicillin Rash   Doxycycline Rash, Other (See Comments)   Keflex  [cephalexin] Rash, Other (See Comments)   Levofloxacin Rash        Medication List        Accurate as of June 19, 2021  3:41 PM. If you have any questions, ask your nurse or doctor.          albuterol 108 (90 Base) MCG/ACT inhaler Commonly known as: VENTOLIN HFA Inhale into the lungs every 6 (six) hours as needed for wheezing or shortness of breath.   ALPRAZolam 1 MG tablet Commonly known as: XANAX Take 1 mg by mouth 3 (three) times daily as needed for anxiety.   amitriptyline 50 MG tablet Commonly known as: ELAVIL Take 1 tablet (50 mg total) by mouth at bedtime.   amphetamine-dextroamphetamine 10 MG tablet Commonly known as: ADDERALL Take 10 mg by mouth 2 (two) times daily.   cetirizine 10 MG tablet Commonly known as: ZYRTEC Take 1 tablet (10 mg total) by mouth daily.   citalopram 20 MG tablet Commonly known as: CELEXA Take 20 mg by mouth daily.   clindamycin 300 MG capsule Commonly known as: CLEOCIN Take 1 capsule (300 mg total) by mouth 3 (three) times daily.   fluticasone 50 MCG/ACT nasal spray Commonly known as: FLONASE SPRAY 2 SPRAYS INTO EACH NOSTRIL EVERY DAY   fosfomycin 3 g Pack Commonly known as: MONUROL TAKE 3 G BY MOUTH ONCE FOR 1 DOSE.   Gemtesa 75 MG Tabs Generic drug: Vibegron Take 75 mg by mouth daily.   hydrochlorothiazide 12.5 MG tablet Commonly known  as: HYDRODIURIL Take 12.5 mg by mouth daily.   lisinopril 10 MG tablet Commonly known as: ZESTRIL Take 10 mg by mouth daily.   phenazopyridine 200 MG tablet Commonly known as: PYRIDIUM Take 1 tablet (200 mg total) by mouth 3 (three) times daily.   Prempro 0.45-1.5 MG tablet Generic drug: estrogen (conjugated)-medroxyprogesterone Take 1 tablet by mouth daily.   tamsulosin 0.4 MG Caps capsule Commonly known as: FLOMAX Take 1 capsule (0.4 mg total) by mouth daily after supper.   VITAMIN D PO Take 1 capsule by mouth in the morning and at bedtime.        Allergies:   Allergies  Allergen Reactions   Bactrim [Sulfamethoxazole-Trimethoprim] Anaphylaxis   Gadolinium Anaphylaxis   Gadolinium Derivatives Anaphylaxis   Iodinated Diagnostic Agents Anaphylaxis   Iodine Anaphylaxis   Other Nausea And Vomiting, Rash and Hives    Uncoded Allergy. Allergen: BEZONZTATE Uncoded Allergy. Allergen: decongestants   Sulfa Antibiotics Anaphylaxis and Other (See Comments)    Uncoded Allergy. Allergen: decongestants Uncoded Allergy. Allergen: decongestants    Azithromycin Hives   Clarithromycin Other (See Comments)   Furosemide Other (See Comments)   Guaifenesin Nausea And Vomiting   Macrobid [Nitrofurantoin Monohyd Macro] Swelling    Tongue swelling, chest tightness    Nitrofurantoin Hives   Penicillins Hives   Amoxicillin Rash   Doxycycline Rash and Other (See Comments)   Keflex [Cephalexin] Rash and Other (See Comments)   Levofloxacin Rash    Family History: Family History  Problem Relation Age of Onset   Ovarian cancer Mother    Pancreatic cancer Mother    Diabetes Mellitus II Mother    Cancer Father    Diabetes Mellitus II Father    Coronary artery disease Father    Anxiety disorder Sister    Mitral valve prolapse Sister    Drug abuse Daughter    Heart attack Sister     Social History:  reports that she has never smoked. She has never used smokeless tobacco. She reports that she does not currently use alcohol. She reports that she does not use drugs.  ROS: Pertinent ROS in HPI  Physical Exam: There were no vitals taken for this visit.  Constitutional:  Well nourished. Alert and oriented, No acute distress. HEENT: [place] AT, moist mucus membranes.  Trachea midline, no masses. Cardiovascular: No clubbing, cyanosis, or edema. Respiratory: Normal respiratory effort, no increased work of breathing. GI: Abdomen is soft, non tender, non distended, no abdominal masses. Liver and spleen not palpable.  No hernias appreciated.  Stool sample for occult  testing is not indicated.   GU: No CVA tenderness.  No bladder fullness or masses.  *** external genitalia, *** pubic hair distribution, no lesions.  Normal urethral meatus, no lesions, no prolapse, no discharge.   No urethral masses, tenderness and/or tenderness. No bladder fullness, tenderness or masses. *** vagina mucosa, *** estrogen effect, no discharge, no lesions, *** pelvic support, *** cystocele and *** rectocele noted.  No cervical motion tenderness.  Uterus is freely mobile and non-fixed.  No adnexal/parametria masses or tenderness noted.  Anus and perineum are without rashes or lesions.   ***  Skin: No rashes, bruises or suspicious lesions. Lymph: No cervical or inguinal adenopathy. Neurologic: Grossly intact, no focal deficits, moving all 4 extremities. Psychiatric: Normal mood and affect.    Laboratory Data: N/A  Pertinent Imaging: ***    Assessment & Plan:    1. Gross hematuria - I explained to the patient that there are  a number of causes that can be associated with blood in the urine, such as stones, UTI's, damage to the urinary tract and/or cancer. -at this time, they are in a high risk stratification for hematuria per AUA guidelines due to gross heme  -recommended studies for high risk are CT urogram and cysto, but she experiences anaphylactic reaction with seafood so she does not want to pursue a CT scan with contrast at this time - Following the imaging study,  I've recommended a cystoscopy. I described how this is performed, typically in an office setting with a flexible cystoscope. We described the risks, benefits, and possible side effects, the most common of which is a minor amount of blood in the urine and/or burning which usually resolves in 24 to 48 hours.   - The patient had the opportunity to ask questions which were answered. Based upon this discussion, the patient is willing to proceed. Therefore, I've ordered: a CT Renal stone study and cystoscopy. - The  patient will return following all of the above for discussion of the results.  - UA - Urine culture    2. Feelings of incomplete bladder emptying -Try to explained to the patient that she is emptying her bladder as her bladder scan from her visit in August and also her bladder scan on her visit today noted a minimal PVR, but she was not satisfied with this explanation -gave her Gemtesa 75 mg, # 28 samples for possible OAB  3. Dysuria -UA benign -urine sent for culture -Explained that at the present time her urinalysis did not note any infection and that I need to send it for culture for further evaluation and that it was not appropriate to prescribe an antibiotic at this time, she was not satisfied with this plan  At checkout, she she said she was just going to seek treatment in the ED as she states that she could be treated right away for her condition  No follow-ups on file.  These notes generated with voice recognition software. I apologize for typographical errors.  Zara Council, PA-C  [facility] [address]  [place], [place] [address] [phone]

## 2021-06-24 ENCOUNTER — Ambulatory Visit: Payer: Medicaid Other | Admitting: Urology

## 2021-06-24 DIAGNOSIS — R310 Gross hematuria: Secondary | ICD-10-CM

## 2021-06-24 DIAGNOSIS — N3281 Overactive bladder: Secondary | ICD-10-CM

## 2021-07-01 ENCOUNTER — Other Ambulatory Visit: Payer: Self-pay | Admitting: Urology

## 2021-07-03 ENCOUNTER — Encounter: Payer: Medicaid Other | Admitting: Obstetrics and Gynecology

## 2021-07-15 DIAGNOSIS — L659 Nonscarring hair loss, unspecified: Secondary | ICD-10-CM | POA: Diagnosis not present

## 2021-07-15 DIAGNOSIS — R059 Cough, unspecified: Secondary | ICD-10-CM | POA: Diagnosis not present

## 2021-07-15 DIAGNOSIS — N3011 Interstitial cystitis (chronic) with hematuria: Secondary | ICD-10-CM | POA: Diagnosis not present

## 2021-07-15 DIAGNOSIS — E8801 Alpha-1-antitrypsin deficiency: Secondary | ICD-10-CM | POA: Diagnosis not present

## 2021-07-15 DIAGNOSIS — I341 Nonrheumatic mitral (valve) prolapse: Secondary | ICD-10-CM | POA: Diagnosis not present

## 2021-07-18 ENCOUNTER — Other Ambulatory Visit: Payer: Self-pay

## 2021-07-18 ENCOUNTER — Encounter: Payer: Self-pay | Admitting: *Deleted

## 2021-07-18 DIAGNOSIS — U071 COVID-19: Secondary | ICD-10-CM | POA: Diagnosis not present

## 2021-07-18 DIAGNOSIS — R509 Fever, unspecified: Secondary | ICD-10-CM | POA: Diagnosis present

## 2021-07-18 DIAGNOSIS — I10 Essential (primary) hypertension: Secondary | ICD-10-CM | POA: Insufficient documentation

## 2021-07-18 LAB — CBC
HCT: 38.6 % (ref 36.0–46.0)
Hemoglobin: 13 g/dL (ref 12.0–15.0)
MCH: 29.1 pg (ref 26.0–34.0)
MCHC: 33.7 g/dL (ref 30.0–36.0)
MCV: 86.4 fL (ref 80.0–100.0)
Platelets: 230 10*3/uL (ref 150–400)
RBC: 4.47 MIL/uL (ref 3.87–5.11)
RDW: 12.7 % (ref 11.5–15.5)
WBC: 4.9 10*3/uL (ref 4.0–10.5)
nRBC: 0 % (ref 0.0–0.2)

## 2021-07-18 LAB — URINALYSIS, MICROSCOPIC (REFLEX)
Bacteria, UA: NONE SEEN
RBC / HPF: NONE SEEN RBC/hpf (ref 0–5)
Squamous Epithelial / HPF: NONE SEEN (ref 0–5)

## 2021-07-18 LAB — COMPREHENSIVE METABOLIC PANEL
ALT: 25 U/L (ref 0–44)
AST: 24 U/L (ref 15–41)
Albumin: 4.6 g/dL (ref 3.5–5.0)
Alkaline Phosphatase: 101 U/L (ref 38–126)
Anion gap: 6 (ref 5–15)
BUN: 9 mg/dL (ref 6–20)
CO2: 27 mmol/L (ref 22–32)
Calcium: 9.3 mg/dL (ref 8.9–10.3)
Chloride: 104 mmol/L (ref 98–111)
Creatinine, Ser: 0.75 mg/dL (ref 0.44–1.00)
GFR, Estimated: >60 mL/min (ref >60)
Glucose, Bld: 90 mg/dL (ref 70–99)
Potassium: 3.4 mmol/L — ABNORMAL LOW (ref 3.5–5.1)
Sodium: 137 mmol/L (ref 135–145)
Total Bilirubin: 0.6 mg/dL (ref 0.3–1.2)
Total Protein: 7.6 g/dL (ref 6.5–8.1)

## 2021-07-18 LAB — URINALYSIS, ROUTINE W REFLEX MICROSCOPIC
Bilirubin Urine: NEGATIVE
Glucose, UA: NEGATIVE mg/dL
Ketones, ur: NEGATIVE mg/dL
Nitrite: NEGATIVE
Protein, ur: NEGATIVE mg/dL
Specific Gravity, Urine: <1.005 — ABNORMAL LOW (ref 1.005–1.030)
pH: 6 (ref 5.0–8.0)

## 2021-07-18 MED ORDER — IBUPROFEN 800 MG PO TABS
800.0000 mg | ORAL_TABLET | Freq: Once | ORAL | Status: AC
  Administered 2021-07-19: 01:00:00 800 mg via ORAL
  Filled 2021-07-18: qty 1

## 2021-07-18 NOTE — ED Triage Notes (Addendum)
Pt reports fever today.  Pt has lower back pain.  Pt has dysuria.  Pt had diarrhea yesterday.    Pt alert  speech clear.   Pt took her own tylenol in triage 1000 mg.

## 2021-07-19 ENCOUNTER — Emergency Department
Admission: EM | Admit: 2021-07-19 | Discharge: 2021-07-19 | Disposition: A | Discharge status: Home / Self Care | Payer: Medicaid Other | Attending: Emergency Medicine | Admitting: Emergency Medicine

## 2021-07-19 DIAGNOSIS — U071 COVID-19: Secondary | ICD-10-CM

## 2021-07-19 LAB — RESP PANEL BY RT-PCR (FLU A&B, COVID) ARPGX2
Influenza A by PCR: NEGATIVE
Influenza B by PCR: NEGATIVE
SARS Coronavirus 2 by RT PCR: POSITIVE — AB

## 2021-07-19 MED ORDER — NIRMATRELVIR/RITONAVIR (PAXLOVID)TABLET
3.0000 | ORAL_TABLET | Freq: Two times a day (BID) | ORAL | 0 refills | Status: AC
Start: 2021-07-19 — End: 2021-07-24

## 2021-07-19 NOTE — Discharge Instructions (Addendum)
Steps to find a Primary Care Provider (PCP):  Call [phone] or [phone] to access "[facility]."  2.  You may also go on the [facility] website at [email or web address]   You may alternate Tylenol 1000 mg every 6 hours as needed for pain, fever and Ibuprofen 800 mg every 8 hours as needed for pain, fever.  Please take Ibuprofen with food.  Do not take more than 4000 mg of Tylenol (acetaminophen) in a 24 hour period.

## 2021-07-19 NOTE — ED Provider Notes (Signed)
[facility] Emergency Department Provider Note  ____________________________________________   Event Date/Time   First MD Initiated Contact with Patient 07/19/21 [phone]     (approximate)  I have reviewed the triage vital signs and the nursing notes.   HISTORY  Chief Complaint Fever and Back Pain    HPI Shirley [name] is a 50 y.o. female with history of hypertension, hyperlipidemia, anxiety, interstitial cystitis, alpha-1 antitrypsin deficiency who presents to the emergency department with complaints of 4 days of fevers, chills, body aches, back pain, congestion, diarrhea.  She was concerned that she could have a urinary tract infection.  She is not vaccinated against COVID-19.  States she is having some chest pain now but thinks it is her anxiety.  No shortness of breath.  No vomiting.        Past Medical History:  Diagnosis Date   Abnormal Pap smear of cervix    ADHD (attention deficit hyperactivity disorder)    Anxiety    Mitral valve prolapse    Spina bifida occulta    UTI (urinary tract infection)     Patient Active Problem List   Diagnosis Date Noted   Concern about sexually transmitted disease in female without diagnosis 01/22/2021   Dysuria 01/22/2021   Hyperlipidemia 12/31/2020   Renal stone 12/31/2020   Hidradenitis suppurativa of left axilla 12/31/2020   Cellulitis of left buttock 12/31/2020   Interstitial cystitis (chronic) with hematuria 11/15/2020   Perimenopause 09/19/2016   Family history of ovarian cancer 09/19/2016   Family history of colon cancer 09/19/2016   Anxiety 09/19/2016   Increased BMI 09/19/2016   Cyst of right ovary 09/19/2016   Abnormal uterine bleeding (AUB) 09/19/2016   Frequency of urination 09/19/2016   Status post LEEP (loop electrosurgical excision procedure) of cervix 09/19/2016    Past Surgical History:  Procedure Laterality Date   LEEP      Prior to Admission medications   Medication Sig Start  Date End Date Taking? Authorizing Provider  nirmatrelvir/ritonavir EUA (PAXLOVID) 20 x 150 MG & 10 x 100MG  TABS Take 3 tablets by mouth 2 (two) times daily for 5 days. Patient GFR is 60. Take nirmatrelvir (150 mg) two tablets twice daily for 5 days and ritonavir (100 mg) one tablet twice daily for 5 days. 07/19/21 07/24/21 Yes Anthonia Monger N, DO  albuterol (PROVENTIL HFA;VENTOLIN HFA) 108 (90 Base) MCG/ACT inhaler Inhale into the lungs every 6 (six) hours as needed for wheezing or shortness of breath.    [provider]  ALPRAZolam Duanne Moron) 1 MG tablet Take 1 mg by mouth 3 (three) times daily as needed for anxiety.    [provider]  amitriptyline (ELAVIL) 50 MG tablet Take 1 tablet (50 mg total) by mouth at bedtime. 03/12/21   Billey Co, MD  amphetamine-dextroamphetamine (ADDERALL) 10 MG tablet Take 10 mg by mouth 2 (two) times daily. 12/18/20   [provider]  cetirizine (ZYRTEC) 10 MG tablet Take 1 tablet (10 mg total) by mouth daily. 01/07/21   Montel Culver, MD  citalopram (CELEXA) 20 MG tablet Take 20 mg by mouth daily. 01/17/21   [provider]  clindamycin (CLEOCIN) 300 MG capsule Take 1 capsule (300 mg total) by mouth 3 (three) times daily. 04/03/21   Rubie Maid, MD  fluticasone [facility]) 50 MCG/ACT nasal spray SPRAY 2 SPRAYS INTO EACH NOSTRIL EVERY DAY 01/29/21   Montel Culver, MD  fosfomycin (MONUROL) 3 g PACK TAKE 3 G BY MOUTH  ONCE FOR 1 DOSE. 10/16/20   Rubie Maid, MD  hydrochlorothiazide (HYDRODIURIL) 12.5 MG tablet Take 12.5 mg by mouth daily. 06/04/20   [provider]  lisinopril (PRINIVIL,ZESTRIL) 10 MG tablet Take 10 mg by mouth daily.    [provider]  phenazopyridine (PYRIDIUM) 200 MG tablet Take 1 tablet (200 mg total) by mouth 3 (three) times daily. 03/01/21   Laurene Footman B, PA-C  PREMPRO 0.45-1.5 MG tablet Take 1 tablet by mouth daily. 01/03/21   [provider]  tamsulosin (FLOMAX) 0.4 MG CAPS capsule  Take 1 capsule (0.4 mg total) by mouth daily after supper. 12/31/20   Montel Culver, MD  Vibegron (GEMTESA) 75 MG TABS Take 75 mg by mouth daily. 06/10/21   Zara Council A, PA-C  VITAMIN D PO Take 1 capsule by mouth in the morning and at bedtime.    [provider]    Allergies Bactrim [sulfamethoxazole-trimethoprim], Gadolinium, Gadolinium derivatives, Iodinated diagnostic agents, Iodine, Other, Sulfa antibiotics, Azithromycin, Clarithromycin, Furosemide, Guaifenesin, Macrobid [nitrofurantoin monohyd macro], Nitrofurantoin, Penicillins, Amoxicillin, Doxycycline, Keflex [cephalexin], and Levofloxacin  Family History  Problem Relation Age of Onset   Ovarian cancer Mother    Pancreatic cancer Mother    Diabetes Mellitus II Mother    Cancer Father    Diabetes Mellitus II Father    Coronary artery disease Father    Anxiety disorder Sister    Mitral valve prolapse Sister    Drug abuse Daughter    Heart attack Sister     Social History Social History   Tobacco Use   Smoking status: Never   Smokeless tobacco: Never  Vaping Use   Vaping Use: Never used  Substance Use Topics   Alcohol use: Not Currently   Drug use: Never    Review of Systems Constitutional: + fever. Eyes: No visual changes. ENT: No sore throat. Cardiovascular:+ chest pain. Respiratory: Denies shortness of breath. Gastrointestinal: No nausea, vomiting.  + diarrhea. Genitourinary: Negative for dysuria. Musculoskeletal: + for back pain. Skin: Negative for rash. Neurological: Negative for focal weakness or numbness.  ____________________________________________   PHYSICAL EXAM:  VITAL SIGNS: ED Triage Vitals  Enc Vitals Group     BP 07/18/21 2238 (!) 161/98     Pulse Rate 07/18/21 2238 (!) 105     Resp 07/18/21 2238 18     Temp 07/18/21 2238 (!) 100.4 F (38 C)     Temp Source 07/18/21 2238 Oral     SpO2 07/18/21 2238 99 %     Weight 07/18/21 2238 180 lb (81.6 kg)     Height 07/18/21  2238 5\' 6"  (1.676 m)     Head Circumference --      Peak Flow --      Pain Score 07/18/21 2238 10     Pain Loc --      Pain Edu? --      Excl. in [place]? --    CONSTITUTIONAL: Alert and oriented and responds appropriately to questions. Well-appearing; well-nourished, afebrile currently, nontoxic in appearance, well-hydrated HEAD: Normocephalic EYES: Conjunctivae clear, pupils appear equal, EOM appear intact ENT: normal nose; moist mucous membranes NECK: Supple, normal ROM CARD: RRR; S1 and S2 appreciated; no murmurs, no clicks, no rubs, no gallops RESP: Normal chest excursion without splinting or tachypnea; breath sounds clear and equal bilaterally; no wheezes, no rhonchi, no rales, no hypoxia or respiratory distress, speaking full sentences ABD/GI: Normal bowel sounds; non-distended; soft, non-tender, no rebound, no guarding, no peritoneal signs, no hepatosplenomegaly BACK:  The back appears normal EXT: Normal ROM in all joints; no deformity noted, no edema; no cyanosis SKIN: Normal color for age and race; warm; no rash on exposed skin NEURO: Moves all extremities equally, normal speech, normal gait PSYCH: Patient is very anxious.  Denies SI.  ____________________________________________   LABS (all labs ordered are listed, but only abnormal results are displayed)  Labs Reviewed  RESP PANEL BY RT-PCR (FLU A&B, COVID) ARPGX2 - Abnormal; Notable for the following components:      Result Value   SARS Coronavirus 2 by RT PCR POSITIVE (*)    All other components within normal limits  COMPREHENSIVE METABOLIC PANEL - Abnormal; Notable for the following components:   Potassium 3.4 (*)    All other components within normal limits  URINALYSIS, ROUTINE W REFLEX MICROSCOPIC - Abnormal; Notable for the following components:   Color, Urine STRAW (*)    Specific Gravity, Urine <1.005 (*)    Hgb urine dipstick SMALL (*)    Leukocytes,Ua TRACE (*)    All other components within normal limits   URINE CULTURE  CBC  URINALYSIS, MICROSCOPIC (REFLEX)   ____________________________________________  EKG   EKG Interpretation  Date/Time:  Friday July 19 2021 02:49:22 EST Ventricular Rate:  85 PR Interval:  164 QRS Duration: 96 QT Interval:  374 QTC Calculation: 445 R Axis:   -3 Text Interpretation: Sinus rhythm with Premature supraventricular complexes Otherwise normal ECG Confirmed by Pryor Curia [phone]) on 07/19/2021 5:15:56 AM        ____________________________________________  RADIOLOGY Jessie Foot Magally Vahle, personally viewed and evaluated these images (plain radiographs) as part of my medical decision making, as well as reviewing the written report by the radiologist.  ED MD interpretation:    Official radiology report(s): No results found.  ____________________________________________   PROCEDURES  Procedure(s) performed (including Critical Care):  Procedures    ____________________________________________   INITIAL IMPRESSION / ASSESSMENT AND PLAN / ED COURSE  As part of my medical decision making, I reviewed the following data within the [place] History obtained from family, Nursing notes reviewed and incorporated, Labs reviewed , EKG interpreted , Old chart reviewed, and Notes from prior ED visits         Patient here with symptoms of viral illness.  She was concerned she could have a UTI given her history of interstitial cystitis however her urine obtained in triage does not appear infected.  She did test positive for COVID-19 here.  Was febrile on arrival but vital signs have improved after antipyretics.  She has no shortness of breath and no increased work of breathing.  Her lungs are clear to auscultation and she is not hypoxic.  Patient is extremely anxious.  She states that she is extremely worried about COVID-19 and has been isolating for the past couple of years and wears mask and washes her hands religiously.  She  thinks that she may have picked up COVID-19 at her primary care doctor's office on Monday.  She is very upset by this diagnosis as is her son at the bedside.  Attempted to reassure patient multiple times given her labs, vitals were within normal limits.  Have recommended paxlovid given she does have risk factors including BMI greater than 25, hypertension.  Have recommended that she start this medication this morning given she is right at the treatment window.  Have also encouraged her to follow-up with her primary care doctor to discuss vaccination given she and her son have chronic medical  issues and are not vaccinated against COVID-19.  Discussed at length supportive care instructions and return precautions.  I spent a long time talking to her about her anxiety.  Sounds like she does have an outpatient psychiatrist and has been on Xanax for many years.  We discussed that she may need to be on something more long-acting like an SSRI given her anxiety seems to have just worsened over time especially in the setting of social isolation.  She denies adamantly any suicidal thoughts and I do not feel she needs emergent psychiatric evaluation.  Patient and son seem to be reassured.  I feel she is safe for discharge.   At this time, I do not feel there is any life-threatening condition present. I have reviewed, interpreted and discussed all results (EKG, imaging, lab, urine as appropriate) and exam findings with patient/family. I have reviewed nursing notes and appropriate previous records.  I feel the patient is safe to be discharged home without further emergent workup and can continue workup as an outpatient as needed. Discussed usual and customary return precautions. Patient/family verbalize understanding and are comfortable with this plan.  Outpatient follow-up has been provided as needed. All questions have been answered.   ED PROGRESS  Patient called back to the emergency department given concerns that her  insurance company told her that she needed to be admitted to the hospital given her alpha-1 antitrypsin deficiency.  It was discussed with patient that she was not hypoxic and had no increased work of breathing or respiratory distress and did not require admission at this time but if she had any further medical concerns she could follow-up with her primary care doctor or if she was concerned for any emergency that she could return at any time to the ED.  I suspect anxiety is playing a significant role in patient's concerns today. ____________________________________________   FINAL CLINICAL IMPRESSION(S) / ED DIAGNOSES  Final diagnoses:  COVID-19     ED Discharge Orders          Ordered    nirmatrelvir/ritonavir EUA (PAXLOVID) 20 x 150 MG & 10 x 100MG  TABS  2 times daily        07/19/21 8127            *Please note:  Shirley [name] was evaluated in Emergency Department on 07/19/2021 for the symptoms described in the history of present illness. She was evaluated in the context of the global COVID-19 pandemic, which necessitated consideration that the patient might be at risk for infection with the SARS-CoV-2 virus that causes COVID-19. Institutional protocols and algorithms that pertain to the evaluation of patients at risk for COVID-19 are in a state of rapid change based on information released by regulatory bodies including the CDC and federal and state organizations. These policies and algorithms were followed during the patient's care in the ED.  Some ED evaluations and interventions may be delayed as a result of limited staffing during and the pandemic.*   Note:  This document was prepared using Dragon voice recognition software and may include unintentional dictation errors.    Ebany Bowermaster, Delice Bison, DO 07/19/21 [phone]

## 2021-07-20 ENCOUNTER — Telehealth: Payer: Self-pay

## 2021-07-20 DIAGNOSIS — R768 Other specified abnormal immunological findings in serum: Secondary | ICD-10-CM | POA: Diagnosis not present

## 2021-07-20 LAB — URINE CULTURE: Culture: NO GROWTH

## 2021-07-20 NOTE — Telephone Encounter (Signed)
Transition Care Management Unsuccessful Follow-up Telephone Call  Date of discharge and from where:  07/19/2021-[facility]  Attempts:  1st Attempt  Reason for unsuccessful TCM follow-up call:  Left voice message

## 2021-07-23 ENCOUNTER — Ambulatory Visit: Payer: Self-pay

## 2021-07-23 NOTE — Telephone Encounter (Signed)
°  Chief Complaint: cough and muscle aches Symptoms: occasional headache, productive cough (phlegm green to white), sinus congestion Frequency:  Pertinent Negatives: Patient denies SOB, vomiting, diarrhea,  Disposition: [] ED /[] Urgent Care (no appt availability in office) / [] Appointment(In office/virtual)/ []  [facility]/ [] Home Care/ [x] Refused Recommended Disposition  Additional Notes: Recommended VV through [facility], pt unwilling to sign up for MyChart, Mobile bus, UC. Pt stated she will just do the VV tomorrow with [facility].             Headache that comes and goes Has heart condition and HTN Cannot take anything OTC Fever 99.0 today Muscle pain, cough green to white  Sinus congestion   Reason for Disposition  [1] HIGH RISK for severe COVID complications (e.g., weak immune system, age > 47 years, obesity with BMI > 25, pregnant, chronic lung disease or other chronic medical condition) AND [2] COVID symptoms (e.g., cough, fever)  (Exceptions: Already seen by PCP and no new or worsening symptoms.)    Was prescribed Paxlovid but did not take it  Answer Assessment - Initial Assessment Questions 1. COVID-19 DIAGNOSIS: "Who made your COVID-19 diagnosis?" "Was it confirmed by a positive lab test or self-test?" If not diagnosed by a doctor (or NP/PA), ask "Are there lots of cases (community spread) where you live?" Note: See public health department website, if unsure.     Self est- yes and rising 2. COVID-19 EXPOSURE: "Was there any known exposure to COVID before the symptoms began?" CDC Definition of close contact: within 6 feet (2 meters) for a total of 15 minutes or more over a 24-hour period.      yes 3. ONSET: "When did the COVID-19 symptoms start?"      6 days ago 4. WORST SYMPTOM: "What is your worst symptom?" (e.g., cough, fever, shortness of breath, muscle aches)     Cough, muscle aches 5. COUGH: "Do you have a cough?" If Yes, ask: "How bad is the cough?"       Yes  occasional  6. FEVER: "Do you have a fever?" If Yes, ask: "What is your temperature, how was it measured, and when did it start?"     no 7. RESPIRATORY STATUS: "Describe your breathing?" (e.g., shortness of breath, wheezing, unable to speak)      No issues 9. HIGH RISK DISEASE: "Do you have any chronic medical problems?" (e.g., asthma, heart or lung disease, weak immune system, obesity, etc.)     Heart , HTN 10. VACCINE: "Have you had the COVID-19 vaccine?" If Yes, ask: "Which one, how many shots, when did you get it?"       none 11. BOOSTER: "Have you received your COVID-19 booster?" If Yes, ask: "Which one and when did you get it?"       none 12. PREGNANCY: "Is there any chance you are pregnant?" "When was your last menstrual period?"        13. OTHER SYMPTOMS: "Do you have any other symptoms?"  (e.g., chills, fatigue, headache, loss of smell or taste, muscle pain, sore throat)       Headache, muscle pain, Chest burns with coughing 14. O2 SATURATION MONITOR:  "Do you use an oxygen saturation monitor (pulse oximeter) at home?" If Yes, ask "What is your reading (oxygen level) today?" "What is your usual oxygen saturation reading?" (e.g., 95%)       N/a  Protocols used: Coronavirus (COVID-19) Diagnosed or Suspected-A-AH

## 2021-07-24 DIAGNOSIS — U071 COVID-19: Secondary | ICD-10-CM | POA: Diagnosis not present

## 2021-07-24 DIAGNOSIS — J0140 Acute pansinusitis, unspecified: Secondary | ICD-10-CM | POA: Diagnosis not present

## 2021-07-25 ENCOUNTER — Encounter: Payer: Medicaid Other | Admitting: Obstetrics and Gynecology

## 2021-08-01 DIAGNOSIS — J0140 Acute pansinusitis, unspecified: Secondary | ICD-10-CM | POA: Diagnosis not present

## 2021-08-01 DIAGNOSIS — R21 Rash and other nonspecific skin eruption: Secondary | ICD-10-CM | POA: Diagnosis not present

## 2021-08-01 DIAGNOSIS — U071 COVID-19: Secondary | ICD-10-CM | POA: Diagnosis not present

## 2021-08-05 ENCOUNTER — Ambulatory Visit: Payer: Self-pay

## 2021-08-05 NOTE — Telephone Encounter (Signed)
Returned Pt's call. LMOM.  Pt called in for assistance. Pt says that she is scheduled to be a np with Dr. Raliegh Ip at [facility]. Pt says that she tested positive for covid. Pt says that she thought she was getting over it . Pt has been experiencing sx for over 16 days. Pt says that she has a fever of 99.9. she would like to discuss this with a nurse further.

## 2021-08-08 DIAGNOSIS — J019 Acute sinusitis, unspecified: Secondary | ICD-10-CM | POA: Diagnosis not present

## 2021-08-08 DIAGNOSIS — R059 Cough, unspecified: Secondary | ICD-10-CM | POA: Diagnosis not present

## 2021-08-08 DIAGNOSIS — Z8616 Personal history of COVID-19: Secondary | ICD-10-CM | POA: Diagnosis not present

## 2021-08-12 ENCOUNTER — Other Ambulatory Visit: Payer: Self-pay | Admitting: *Deleted

## 2021-08-12 DIAGNOSIS — R350 Frequency of micturition: Secondary | ICD-10-CM

## 2021-08-13 ENCOUNTER — Other Ambulatory Visit: Payer: Self-pay

## 2021-08-13 ENCOUNTER — Ambulatory Visit: Payer: Medicaid Other | Admitting: Urology

## 2021-08-13 ENCOUNTER — Other Ambulatory Visit
Admission: RE | Admit: 2021-08-13 | Discharge: 2021-08-13 | Disposition: A | Discharge status: Home / Self Care | Payer: Medicaid Other | Attending: Urology | Admitting: Urology

## 2021-08-13 ENCOUNTER — Encounter: Payer: Self-pay | Admitting: Urology

## 2021-08-13 VITALS — BP 129/76 | HR 85 | Ht 66.0 in | Wt 182.0 lb

## 2021-08-13 DIAGNOSIS — N3010 Interstitial cystitis (chronic) without hematuria: Secondary | ICD-10-CM

## 2021-08-13 DIAGNOSIS — R350 Frequency of micturition: Secondary | ICD-10-CM | POA: Insufficient documentation

## 2021-08-13 DIAGNOSIS — R102 Pelvic and perineal pain: Secondary | ICD-10-CM | POA: Diagnosis not present

## 2021-08-13 LAB — URINALYSIS, COMPLETE (UACMP) WITH MICROSCOPIC
Bacteria, UA: NONE SEEN
Bilirubin Urine: NEGATIVE
Glucose, UA: NEGATIVE mg/dL
Ketones, ur: NEGATIVE mg/dL
Nitrite: NEGATIVE
Protein, ur: NEGATIVE mg/dL
Specific Gravity, Urine: <1.005 — ABNORMAL LOW (ref 1.005–1.030)
pH: 5.5 (ref 5.0–8.0)

## 2021-08-13 LAB — BLADDER SCAN AMB NON-IMAGING

## 2021-08-13 NOTE — Patient Instructions (Signed)
Amitriptyline is a medication that can help with interstitial cystitis and chronic pelvic pain/discomfort  If you do not have improvement over the next month on the amitriptyline, I would strongly recommend following up with the pelvic floor physical therapist

## 2021-08-13 NOTE — Progress Notes (Signed)
° °  08/13/2021 4:12 PM   Shirley [name] [birth year] [id number]  Reason for visit: " Thick urine," pelvic pain, flank pain, history of interstitial cystitis  HPI: 51 year old extremely comorbid female with significant anxiety and chronic pain.  She recently had COVID 1 to 2 weeks ago, and felt like her urine has been "thicker" and her abdominal pain has been worse and she wanted to get checked out.  I originally saw her in August 2022 when she had a normal urinalysis, normal PVR, and essentially normal recent CT scan, and we discussed that her stress/anxiety were likely large contributors to her chronic pelvic pain/interstitial cystitis symptoms.  At that time I recommended a trial of amitriptyline nightly, but she never started that medication.  The history is extremely challenging today as she has tangential thought process and has trouble focusing on her symptoms and problems.  Her urinalysis today is completely benign, and PVR is normal at 20 mL.  I do not think she warrants further evaluation with cystoscopy with multiple negative urine samples, and additionally she has deferred this in the past with anxiety related to procedures in clinic.  We discussed the complexities of pelvic pain and possible range of etiologies including pelvic floor dysfunction, chronic bladder pain syndrome, and interstitial cystitis.  We reviewed the AUA guidelines that recommend an algorithmic approach to treatment for these patients, and that a trial of different medications and strategies is sometimes needed to find the approach that works best for each patient's unique situation.  I reinforced the importance of stress management, relaxation, avoiding triggers, and pain management in the approach to pelvic pain.  Again recommended considering trial of the amitriptyline for her chronic pelvic pain Referral placed to pelvic floor physical therapy RTC 6 months symptom check  I spent 45 total minutes on the day of the  encounter including pre-visit review of the medical record, face-to-face time with the patient, and post visit ordering of labs/imaging/tests.  Billey Co, [facility] [address] [place], [place] [address] [phone]

## 2021-08-14 ENCOUNTER — Encounter: Payer: Medicaid Other | Admitting: Obstetrics and Gynecology

## 2021-08-19 ENCOUNTER — Ambulatory Visit: Payer: Medicaid Other | Admitting: Urology

## 2021-08-26 ENCOUNTER — Ambulatory Visit: Payer: Medicaid Other | Admitting: Family Medicine

## 2021-08-26 ENCOUNTER — Encounter: Payer: Self-pay | Admitting: Family Medicine

## 2021-08-26 ENCOUNTER — Other Ambulatory Visit: Payer: Self-pay

## 2021-08-26 VITALS — BP 129/65 | HR 75 | Temp 98.4°F | Resp 17 | Ht 66.0 in | Wt 181.6 lb

## 2021-08-26 DIAGNOSIS — F410 Panic disorder [episodic paroxysmal anxiety] without agoraphobia: Secondary | ICD-10-CM | POA: Insufficient documentation

## 2021-08-26 DIAGNOSIS — F411 Generalized anxiety disorder: Secondary | ICD-10-CM

## 2021-08-26 DIAGNOSIS — F331 Major depressive disorder, recurrent, moderate: Secondary | ICD-10-CM | POA: Diagnosis not present

## 2021-08-26 DIAGNOSIS — F902 Attention-deficit hyperactivity disorder, combined type: Secondary | ICD-10-CM

## 2021-08-26 DIAGNOSIS — Z7689 Persons encountering health services in other specified circumstances: Secondary | ICD-10-CM | POA: Diagnosis not present

## 2021-08-26 DIAGNOSIS — J4530 Mild persistent asthma, uncomplicated: Secondary | ICD-10-CM

## 2021-08-26 DIAGNOSIS — R300 Dysuria: Secondary | ICD-10-CM

## 2021-08-26 DIAGNOSIS — F4310 Post-traumatic stress disorder, unspecified: Secondary | ICD-10-CM | POA: Insufficient documentation

## 2021-08-26 LAB — POCT URINALYSIS DIPSTICK
Bilirubin, UA: NEGATIVE
Blood, UA: NEGATIVE
Glucose, UA: NEGATIVE
Ketones, UA: NEGATIVE
Leukocytes, UA: NEGATIVE
Nitrite, UA: NEGATIVE
Protein, UA: NEGATIVE
Spec Grav, UA: 1.01 (ref 1.010–1.025)
Urobilinogen, UA: 0.2 E.U./dL
pH, UA: 5 (ref 5.0–8.0)

## 2021-08-26 MED ORDER — ALBUTEROL SULFATE HFA 108 (90 BASE) MCG/ACT IN AERS: 2.0000 | INHALATION_SPRAY | Freq: Four times a day (QID) | RESPIRATORY_TRACT | 3 refills | Status: DC | PRN

## 2021-08-26 NOTE — Patient Instructions (Addendum)
Thank you for coming to the office today.  Urine today follow up on results soon with urine culture.  Follow up with [facility] as you plan.  Keep up with Dr Marcelline Mates  Follow up with Dr Carlos Levering Su  Keep an eye on BP readings,, goal < 135 / 85, keep an eye on this and we can adjust accordingly.  ----------------------------  For Mammogram screening for breast cancer   Call the [facility] below anytime after Dr Marcelline Mates orders it.  [facility] [place], [place] [address] Phone: [phone]  ------------------------------------------------  Colon Cancer Screening: - For all adults age 52+ routine colon cancer screening is highly recommended.     - Recent guidelines from [place] recommend starting age of 17 - Early detection of colon cancer is important, because often there are no warning signs or symptoms, also if found early usually it can be cured. Late stage is hard to treat.  - If you are not interested in Colonoscopy screening (if done and normal you could be cleared for 5 to 10 years until next due), then Cologuard is an excellent alternative for screening test for Colon Cancer. It is highly sensitive for detecting DNA of colon cancer from even the earliest stages. Also, there is NO bowel prep required. - If Cologuard is NEGATIVE, then it is good for 3 years before next due - If Cologuard is POSITIVE, then it is strongly advised to get a Colonoscopy, which allows the GI doctor to locate the source of the cancer or polyp (even very early stage) and treat it by removing it. ------------------------- If you would like to proceed with Cologuard (stool DNA test) - FIRST, call your insurance company and tell them you want to check cost of Cologuard tell them CPT Code [id number] (it may be completely covered and you could get for no cost, OR max cost without any coverage is about $600). Also, keep in mind  if you do NOT open the kit, and decide not to do the test, you will NOT be charged, you should contact the company if you decide not to do the test. - If you want to proceed, you can notify us (phone message, [place], or at next visit) and we will order it for you. The test kit will be delivered to you house within about 1 week. Follow instructions to collect sample, you may call the company for any help or questions, 24/7 telephone support at [phone].    Dietary Guidelines to Help Prevent Kidney Stones Kidney stones are deposits of minerals and salts that form inside your kidneys. Your risk of developing kidney stones may be greater depending on your diet, your lifestyle, the medicines you take, and whether you have certain medical conditions. Most people can lower their chances of developing kidney stones by following the instructions below. Your dietitian may give you more specific instructions depending on your overall health and the type of kidney stones you tend to develop. What are tips for following this plan? Reading food labels  Choose foods with "no salt added" or "low-salt" labels. Limit your salt (sodium) intake to less than 1,500 mg a day. Choose foods with calcium for each meal and snack. Try to eat about 300 mg of calcium at each meal. Foods that contain 200-500 mg of calcium a serving include: 8 oz (237 mL) of milk, calcium-fortifiednon-dairy milk, and calcium-fortifiedfruit juice. Calcium-fortified means that calcium has been added  to these drinks. 8 oz (237 mL) of kefir, yogurt, and soy yogurt. 4 oz (114 g) of tofu. 1 oz (28 g) of cheese. 1 cup (150 g) of dried figs. 1 cup (91 g) of cooked broccoli. One 3 oz (85 g) can of sardines or mackerel. Most people need 1,000-1,500 mg of calcium a day. Talk to your dietitian about how much calcium is recommended for you. Shopping Buy plenty of fresh fruits and vegetables. Most people do not need to avoid fruits and  vegetables, even if these foods contain nutrients that may contribute to kidney stones. When shopping for convenience foods, choose: Whole pieces of fruit. Pre-made salads with dressing on the side. Low-fat fruit and yogurt smoothies. Avoid buying frozen meals or prepared deli foods. These can be high in sodium. Look for foods with live cultures, such as yogurt and kefir. Choose high-fiber grains, such as whole-wheat breads, oat bran, and wheat cereals. Cooking Do not add salt to food when cooking. Place a salt shaker on the table and allow each person to add his or her own salt to taste. Use vegetable protein, such as beans, textured vegetable protein (TVP), or tofu, instead of meat in pasta, casseroles, and soups. Meal planning Eat less salt, if told by your dietitian. To do this: Avoid eating processed or pre-made food. Avoid eating fast food. Eat less animal protein, including cheese, meat, poultry, or fish, if told by your dietitian. To do this: Limit the number of times you have meat, poultry, fish, or cheese each week. Eat a diet free of meat at least 2 days a week. Eat only one serving each day of meat, poultry, fish, or seafood. When you prepare animal protein, cut pieces into small portion sizes. For most meat and fish, one serving is about the size of the palm of your hand. Eat at least five servings of fresh fruits and vegetables each day. To do this: Keep fruits and vegetables on hand for snacks. Eat one piece of fruit or a handful of berries with breakfast. Have a salad and fruit at lunch. Have two kinds of vegetables at dinner. Limit foods that are high in a substance called oxalate. These include: Spinach (cooked), rhubarb, beets, sweet potatoes, and Swiss chard. Peanuts. Potato chips, french fries, and baked potatoes with skin on. Nuts and nut products. Chocolate. If you regularly take a diuretic medicine, make sure to eat at least 1 or 2 servings of fruits or  vegetables that are high in potassium each day. These include: Avocado. Banana. Orange, prune, carrot, or tomato juice. Baked potato. Cabbage. Beans and split peas. Lifestyle  Drink enough fluid to keep your urine pale yellow. This is the most important thing you can do. Spread your fluid intake throughout the day. If you drink alcohol: Limit how much you use to: 0-1 drink a day for women who are not pregnant. 0-2 drinks a day for men. Be aware of how much alcohol is in your drink. In the [place] one drink equals one 12 oz bottle of beer (355 mL), one 5 oz glass of wine (148 mL), or one 1 oz glass of hard liquor (44 mL). Lose weight if told by your health care provider. Work with your dietitian to find an eating plan and weight loss strategies that work best for you. General information Talk to your health care provider and dietitian about taking daily supplements. You may be told the following depending on your health and the cause of your kidney  stones: Not to take supplements with vitamin C. To take a calcium supplement. To take a daily probiotic supplement. To take other supplements such as magnesium, fish oil, or vitamin B6. Take over-the-counter and prescription medicines only as told by your health care provider. These include supplements. What foods should I limit? Limit your intake of the following foods, or eat them as told by your dietitian. Vegetables Spinach. Rhubarb. Beets. Canned vegetables. Angie Fava. Olives. Baked potatoes with skin. Grains Wheat bran. Baked goods. Salted crackers. Cereals high in sugar. Meats and other proteins Nuts. Nut butters. Large portions of meat, poultry, or fish. Salted, precooked, or cured meats, such as sausages, meat loaves, and hot dogs. Dairy Cheese. Beverages Regular soft drinks. Regular vegetable juice. Seasonings and condiments Seasoning blends with salt. Salad dressings. Soy sauce. Ketchup. Barbecue sauce. Other foods Canned  soups. Canned pasta sauce. Casseroles. Pizza. Lasagna. Frozen meals. Potato chips. [place] fries. The items listed above may not be a complete list of foods and beverages you should limit. Contact a dietitian for more information. What foods should I avoid? Talk to your dietitian about specific foods you should avoid based on the type of kidney stones you have and your overall health. Fruits Grapefruit. The item listed above may not be a complete list of foods and beverages you should avoid. Contact a dietitian for more information. Summary Kidney stones are deposits of minerals and salts that form inside your kidneys. You can lower your risk of kidney stones by making changes to your diet. The most important thing you can do is drink enough fluid. Drink enough fluid to keep your urine pale yellow. Talk to your dietitian about how much calcium you should have each day, and eat less salt and animal protein as told by your dietitian. This information is not intended to replace advice given to you by your health care provider. Make sure you discuss any questions you have with your health care provider. Document Revised: 07/07/2019 Document Reviewed: 07/07/2019 Elsevier Patient Education  [place].   Please schedule a Follow-up Appointment to: Return in about 3 months (around 11/24/2021) for 3 month follow-up Psych, Uro, GYN.  If you have any other questions or concerns, please feel free to call the office or send a message through [place]. You may also schedule an earlier appointment if necessary.  Additionally, you may be receiving a survey about your experience at our office within a few days to 1 week by e-mail or mail. We value your feedback.  [name], DO [place]
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
## 2021-08-27 LAB — URINE CULTURE
MICRO NUMBER:: 12937123
Result:: NO GROWTH
SPECIMEN QUALITY:: ADEQUATE

## 2021-08-30 ENCOUNTER — Encounter: Payer: Medicaid Other | Admitting: Obstetrics and Gynecology

## 2021-09-12 ENCOUNTER — Ambulatory Visit: Payer: Medicaid Other | Admitting: Family Medicine

## 2021-09-13 ENCOUNTER — Encounter: Payer: Self-pay | Admitting: Family Medicine

## 2021-09-13 ENCOUNTER — Other Ambulatory Visit: Payer: Self-pay

## 2021-09-13 ENCOUNTER — Telehealth (INDEPENDENT_AMBULATORY_CARE_PROVIDER_SITE_OTHER): Payer: Medicaid Other | Admitting: Family Medicine

## 2021-09-13 VITALS — Ht 66.0 in | Wt 181.0 lb

## 2021-09-13 DIAGNOSIS — N3001 Acute cystitis with hematuria: Secondary | ICD-10-CM | POA: Diagnosis not present

## 2021-09-13 DIAGNOSIS — N3011 Interstitial cystitis (chronic) with hematuria: Secondary | ICD-10-CM | POA: Diagnosis not present

## 2021-09-13 DIAGNOSIS — R300 Dysuria: Secondary | ICD-10-CM

## 2021-09-13 LAB — POCT URINALYSIS DIPSTICK
Bilirubin, UA: NEGATIVE
Glucose, UA: NEGATIVE
Ketones, UA: NEGATIVE
Nitrite, UA: NEGATIVE
Protein, UA: POSITIVE — AB
Spec Grav, UA: 1.015 (ref 1.010–1.025)
Urobilinogen, UA: 0.2 E.U./dL
pH, UA: 5 (ref 5.0–8.0)

## 2021-09-13 MED ORDER — FOSFOMYCIN TROMETHAMINE 3 G PO PACK: 3.0000 g | PACK | Freq: Once | ORAL | 0 refills | Status: AC

## 2021-09-13 NOTE — Patient Instructions (Addendum)
Thank you for coming to the office today.  Take Fosfomycin one dose  1. You have a Urinary Tract Infection - this is very common, your symptoms are reassuring and you should get better within 1 week on the antibiotics  - We sent urine for a culture, we will call you within next few days if we need to change antibiotics - Please drink plenty of fluids, improve hydration over next 1 week  If symptoms worsening, developing nausea / vomiting, worsening back pain, fevers / chills / sweats, then please return for re-evaluation sooner.  If you take AZO OTC - limit this to 2-3 days MAX to avoid affecting kidneys  D-Mannose is a natural supplement that can actually help bind to urinary bacteria and reduce their effectiveness it can help prevent UTI from forming, and may reduce some symptoms. It likely cannot cure an active UTI but it is worth a try and good to prevent them with. Try 500mg  twice a day at a full dose if you want, or check package instructions for more info   Please schedule a Follow-up Appointment to: Return if symptoms worsen or fail to improve.  If you have any other questions or concerns, please feel free to call the office or send a message through [place]. You may also schedule an earlier appointment if necessary.  Additionally, you may be receiving a survey about your experience at our office within a few days to 1 week by e-mail or mail. We value your feedback.  [name], DO [place]

## 2021-09-13 NOTE — Progress Notes (Signed)
Subjective:    Patient ID: Shirley [name], female    DOB: [birth year], 51 y.o.   MRN: [id number]  Shirley [name] is a 51 y.o. female presenting on 09/13/2021 for Urinary Tract Infection   HPI  Acute UTI PMH Chronic Interstitial Cystitis Reports recent onset symptoms with low grade temp, abdominal pain cramping recently past few weeks did not resolve. With dysuria as well.    Depression screen [facility] 2/9 08/26/2021 04/03/2021 02/13/2021  Decreased Interest 1 1 2   Down, Depressed, Hopeless 1 1 1   PHQ - 2 Score 2 2 3   Altered sleeping 3 3 1   Tired, decreased energy 3 3 2   Change in appetite 0 3 2  Feeling bad or failure about yourself  1 3 1   Trouble concentrating 2 3 1   Moving slowly or fidgety/restless 0 2 0  Suicidal thoughts 0 0 0  PHQ-9 Score 11 19 10   Difficult doing work/chores Somewhat difficult Somewhat difficult Somewhat difficult    Social History   Tobacco Use   Smoking status: Never   Smokeless tobacco: Never  Vaping Use   Vaping Use: Never used  Substance Use Topics   Alcohol use: Not Currently   Drug use: Never    Review of Systems Per HPI unless specifically indicated above     Objective:    Ht 5\' 6"  (1.676 m)    Wt 181 lb (82.1 kg)    BMI 29.21 kg/m   Wt Readings from Last 3 Encounters:  09/13/21 181 lb (82.1 kg)  08/26/21 181 lb 9.6 oz (82.4 kg)  08/13/21 182 lb (82.6 kg)    Physical Exam Vitals and nursing note reviewed.  Constitutional:      General: She is not in acute distress.    Appearance: Normal appearance. She is well-developed. She is not diaphoretic.     Comments: Well-appearing, comfortable, cooperative  HENT:     Head: Normocephalic and atraumatic.  Eyes:     General:        Right eye: No discharge.        Left eye: No discharge.     Conjunctiva/sclera: Conjunctivae normal.  Cardiovascular:     Rate and Rhythm: Normal rate.  Pulmonary:     Effort: Pulmonary effort is normal.  Skin:    General: Skin is warm and dry.      Findings: No erythema or rash.  Neurological:     Mental Status: She is alert and oriented to person, place, and time.  Psychiatric:        Mood and Affect: Mood normal.        Behavior: Behavior normal.        Thought Content: Thought content normal.     Comments: Well groomed, good eye contact, normal speech and thoughts     Results for orders placed or performed in visit on 09/13/21  POCT Urinalysis Dipstick  Result Value Ref Range   Color, UA Yellow    Clarity, UA Clear    Glucose, UA Negative Negative   Bilirubin, UA Negative    Ketones, UA Negative    Spec Grav, UA 1.015 1.010 - 1.025   Blood, UA Small +    pH, UA 5.0 5.0 - 8.0   Protein, UA Positive (A) Negative   Urobilinogen, UA 0.2 0.2 or 1.0 E.U./dL   Nitrite, UA Negative    Leukocytes, UA Small (1+) (A) Negative   Appearance     Odor  Assessment & Plan:   Problem List Items Addressed This Visit     Interstitial cystitis (chronic) with hematuria   Dysuria   Relevant Orders   POCT Urinalysis Dipstick (Completed)   Urine Culture   Other Visit Diagnoses     Acute cystitis with hematuria    -  Primary   Relevant Medications   fosfomycin (MONUROL) 3 g PACK       Acute UTI History interstitial cystitis chronic problem, followed by Urology  Multilple allergies to ABX Will check UA + Urine Culture UA suggestive of UTI Will treat with Fosfomycin only successful antibiotic for her as reported based on her allergies  Advised her to contact Urology as well to help with further chronic UTI/cystitis management going forward  Meds ordered this encounter  Medications   fosfomycin (MONUROL) 3 g PACK    Sig: Take 3 g by mouth once for 1 dose.    Dispense:  3 g    Refill:  0     Follow up plan: Return if symptoms worsen or fail to improve.    [name], [facility] 09/13/2021, 4:58 PM

## 2021-09-15 LAB — URINE CULTURE
MICRO NUMBER:: 13025338
SPECIMEN QUALITY:: ADEQUATE

## 2021-09-16 ENCOUNTER — Ambulatory Visit: Payer: Medicaid Other | Admitting: Family Medicine

## 2021-09-18 ENCOUNTER — Telehealth: Payer: Self-pay | Admitting: Family Medicine

## 2021-09-18 NOTE — Telephone Encounter (Signed)
.. °  Medicaid Managed Care   Unsuccessful Outreach Note  09/18/2021 Name: Shirley [name] MRN: [id number] DOB: [birth year]  Referred by: Olin Hauser, DO Reason for referral : High Risk Managed Medicaid (I called the patient today to get her scheduled with the MM Team. I left my name and number on her VM.)   An unsuccessful telephone outreach was attempted today. The patient was referred to the case management team for assistance with care management and care coordination.   Follow Up Plan: The care management team will reach out to the patient again over the next 14 days.   [place]

## 2021-09-18 NOTE — Telephone Encounter (Signed)
..  Patient declines further follow up and engagement by the Managed Medicaid Team. Appropriate care team members and provider have been notified via electronic communication. The Managed Medicaid Team is available to follow up with the patient after provider conversation with the patient regarding recommendation for engagement and subsequent re-referral to the Managed Medicaid Team.    Jennifer Alley Care Guide, High Risk Medicaid Managed Care Embedded Care Coordination   [facility]   

## 2021-10-02 ENCOUNTER — Other Ambulatory Visit: Payer: Self-pay | Admitting: Family Medicine

## 2021-10-02 ENCOUNTER — Encounter: Payer: Self-pay | Admitting: Family Medicine

## 2021-10-02 ENCOUNTER — Other Ambulatory Visit: Payer: Self-pay

## 2021-10-02 ENCOUNTER — Ambulatory Visit: Payer: Medicaid Other | Admitting: Family Medicine

## 2021-10-02 VITALS — BP 114/70 | HR 71 | Temp 97.5°F | Wt 182.0 lb

## 2021-10-02 DIAGNOSIS — R300 Dysuria: Secondary | ICD-10-CM | POA: Diagnosis not present

## 2021-10-02 LAB — POCT URINALYSIS DIPSTICK
Bilirubin, UA: NEGATIVE
Glucose, UA: NEGATIVE
Ketones, UA: NEGATIVE
Leukocytes, UA: NEGATIVE
Nitrite, UA: NEGATIVE
Protein, UA: NEGATIVE
Spec Grav, UA: 1.01 (ref 1.010–1.025)
Urobilinogen, UA: 0.2 E.U./dL
pH, UA: 5 (ref 5.0–8.0)

## 2021-10-02 NOTE — Progress Notes (Signed)
? ?Subjective:  ? ? Patient ID: Shirley [name], female    DOB: 1971/05/04, 51 y.o.   MRN: [id number] ? ?Shirley [name] is a 51 y.o. female presenting on 10/02/2021 for Urinary Tract Infection ? ? ?HPI ? ?Acute UTI ?PMH Chronic Interstitial Cystitis ?Last visit with me 09/13/21 for same issue, urinalysis and urine culture done, negative, Group B strep more of a colonized bacteria, not infection low amt 1-9k amt ?Treated with Fosfomycin ? ?Now here today with same symptoms. She questions if infection or with past history of chronic interstitial cystitis ? ?Last seen by Urology in 07/2021, advised to do pelvic floor and trial on TCA return 6 months ? ?[facility] is trying to get records from 2005. She had history of distended bladder. Seems to have issue with bladder dysfunction. Symptoms ? ?Denies nausea vomiting abdominal diarrhea fever chills flank or back pain ? ? ?Depression screen [facility] 2/9 10/02/2021 08/26/2021 04/03/2021  ?Decreased Interest 0 1 1  ?Down, Depressed, Hopeless '1 1 1  '$ ?PHQ - 2 Score '1 2 2  '$ ?Altered sleeping '1 3 3  '$ ?Tired, decreased energy '1 3 3  '$ ?Change in appetite 3 0 3  ?Feeling bad or failure about yourself  '1 1 3  '$ ?Trouble concentrating '3 2 3  '$ ?Moving slowly or fidgety/restless 0 0 2  ?Suicidal thoughts 0 0 0  ?PHQ-9 Score '10 11 19  '$ ?Difficult doing work/chores Not difficult at all Somewhat difficult Somewhat difficult  ? ? ?Social History  ? ?Tobacco Use  ? Smoking status: Never  ? Smokeless tobacco: Never  ?Vaping Use  ? Vaping Use: Never used  ?Substance Use Topics  ? Alcohol use: Not Currently  ? Drug use: Never  ? ? ?Review of Systems ?Per HPI unless specifically indicated above ? ?   ?Objective:  ?  ?BP 114/70 (BP Location: Left Arm, Patient Position: Sitting, Cuff Size: Normal)   Pulse 71   Temp (!) 97.5 ?F (36.4 ?C) (Temporal)   Wt 182 lb (82.6 kg)   SpO2 98%   BMI 29.38 kg/m?   ?Wt Readings from Last 3 Encounters:  ?10/02/21 182 lb (82.6 kg)  ?09/13/21 181 lb (82.1 kg)  ?08/26/21 181 lb 9.6 oz (82.4  kg)  ?  ?Physical Exam ?Vitals and nursing note reviewed.  ?Constitutional:   ?   General: She is not in acute distress. ?   Appearance: Normal appearance. She is well-developed. She is not diaphoretic.  ?   Comments: Well-appearing, comfortable, cooperative  ?HENT:  ?   Head: Normocephalic and atraumatic.  ?Eyes:  ?   General:     ?   Right eye: No discharge.     ?   Left eye: No discharge.  ?   Conjunctiva/sclera: Conjunctivae normal.  ?Cardiovascular:  ?   Rate and Rhythm: Normal rate.  ?Pulmonary:  ?   Effort: Pulmonary effort is normal.  ?Skin: ?   General: Skin is warm and dry.  ?   Findings: No erythema or rash.  ?Neurological:  ?   Mental Status: She is alert and oriented to person, place, and time.  ?Psychiatric:     ?   Mood and Affect: Mood normal.     ?   Behavior: Behavior normal.     ?   Thought Content: Thought content normal.  ?   Comments: Well groomed, good eye contact, normal speech and thoughts  ? ? ?Results for orders placed or performed in visit on 10/02/21  ?POCT  urinalysis dipstick  ?Result Value Ref Range  ? Color, UA Pale Yellow   ? Clarity, UA Clear   ? Glucose, UA Negative Negative  ? Bilirubin, UA Negative   ? Ketones, UA Negative   ? Spec Grav, UA 1.010 1.010 - 1.025  ? Blood, UA Trace   ? pH, UA 5.0 5.0 - 8.0  ? Protein, UA Negative Negative  ? Urobilinogen, UA 0.2 0.2 or 1.0 E.U./dL  ? Nitrite, UA Negative   ? Leukocytes, UA Negative Negative  ? Appearance    ? Odor    ? ?   ?Assessment & Plan:  ? ?Problem List Items Addressed This Visit   ? ? Dysuria - Primary  ? Relevant Orders  ? POCT urinalysis dipstick (Completed)  ?  ?Urinalysis, no infection identified. ? ?Limited options with drug allergies to antibiotic, last time fosfomycin used for possible UTI but urine culture ultimately negative. ? ?Recommend return to Urology for bladder dysfunction. As scheduled in July 2023 as they recommended pelvic floor therapy and further management ? ?Keep up with Dr Marcelline Mates for upcoming cyst  procedure. ? ? ?No orders of the defined types were placed in this encounter. ? ? ? ? ?Follow up plan: ?Return if symptoms worsen or fail to improve. ? ? ?[name], DO ?[facility] ?[facility] ?10/02/2021, 2:22 PM ?

## 2021-10-02 NOTE — Patient Instructions (Addendum)
Thank you for coming to the office today. ? ?Urinalysis, no infection identified. ? ?Recommend return to Urology for bladder dysfunction. They recommended pelvic floor therapy ? ?Keep up with Dr Marcelline Mates for upcoming cyst procedure. ? ?Please schedule a Follow-up Appointment to: Return if symptoms worsen or fail to improve. ? ?If you have any other questions or concerns, please feel free to call the office or send a message through [place]. You may also schedule an earlier appointment if necessary. ? ?Additionally, you may be receiving a survey about your experience at our office within a few days to 1 week by e-mail or mail. We value your feedback. ? ?[name], DO ?[place] ?

## 2021-10-04 ENCOUNTER — Ambulatory Visit: Payer: Self-pay | Admitting: *Deleted

## 2021-10-04 NOTE — Telephone Encounter (Signed)
?  Chief Complaint: blood noted in commode ?Symptoms: pink colored blood in toilet water last night and today with small clots noted. Constipation today . Wiping for bright red blood x 1 . Abdominal cramping right side. ?Frequency: last night  ?Pertinent Negatives: Patient denies dizziness, bleeding now, not taking blood thinners, no vomiting , fever ?Disposition: '[x]'$ ED /'[]'$ Urgent Care (no appt availability in office) / '[]'$ Appointment(In office/virtual)/ '[]'$  [facility] [facility]/ '[]'$ Home Care/ '[]'$ Refused Recommended Disposition /'[]'$ [facility]/ '[]'$  Follow-up with PCP ?Additional Notes:  ? ?Recommended evaluation in ED. No available appt today. Patient reports if she see blood again she will go to ED. Reinforced need to be evaluated today . ? ? Reason for Disposition ? MODERATE rectal bleeding (small blood clots, passing blood without stool, or toilet water turns red) ? ?Answer Assessment - Initial Assessment Questions ?1. APPEARANCE of BLOOD: "What color is it?" "Is it passed separately, on the surface of the stool, or mixed in with the stool?"  ?    Pink to red in water in commode with clot noted ?2. AMOUNT: "How much blood was passed?"  ?    Last night more pink colored blood in toilet water today small clots noted  ?3. FREQUENCY: "How many times has blood been passed with the stools?"  ?    X 2 ?4. ONSET: "When was the blood first seen in the stools?" (Days or weeks)  ?    Last night 1 am  ?5. DIARRHEA: "Is there also some diarrhea?" If Yes, ask: "How many diarrhea stools in the past 24 hours?"  ?    Not today  ?6. CONSTIPATION: "Do you have constipation?" If Yes, ask: "How bad is it?" ?    Yes  ?7. RECURRENT SYMPTOMS: "Have you had blood in your stools before?" If Yes, ask: "When was the last time?" and "What happened that time?"  ?    A long time ago  ?8. BLOOD THINNERS: "Do you take any blood thinners?" (e.g., Coumadin/warfarin, Pradaxa/dabigatran, aspirin) ?    na ?9. OTHER SYMPTOMS: "Do you have any  other symptoms?"  (e.g., abdomen pain, vomiting, dizziness, fever) ?    Abdominal cramping right side , tired. ?10. PREGNANCY: "Is there any chance you are pregnant?" "When was your last menstrual period?" ?      na ? ?Protocols used: Rectal Bleeding-A-AH ? ?

## 2021-10-08 DIAGNOSIS — F410 Panic disorder [episodic paroxysmal anxiety] without agoraphobia: Secondary | ICD-10-CM | POA: Diagnosis not present

## 2021-10-08 DIAGNOSIS — F339 Major depressive disorder, recurrent, unspecified: Secondary | ICD-10-CM | POA: Diagnosis not present

## 2021-10-27 NOTE — Progress Notes (Signed)
10/28/21 ?2:50 PM  ? ?[place] ?05/04/1971 ?[id number] ? ?Referring provider:  ?Olin Hauser, DO ?[address] ?[place],  [place] [address] ? ?Chief Complaint  ?Patient presents with  ? Follow-up  ? Urinary Tract Infection  ? ? ?Urological history  ?1. High risk hematuria ?-non-smoker ?-work up in 2009 - NED ?-reports of gross heme ?-UA negative for micro heme ?  ?2. Urinary retention ?-occurred after both pregnancies ~ 17 years ago ?  ?3. Interstitial cystitis ?-per patient ?-manage with diet ? ? ?HPI: ?Shirley [name] is a 51 y.o.female who presents today for further evaluation of UTI.   ? ?She reports today that she had blood in her stool from her rectum and she has a history of hemorrhoids and has not underwent a colonoscopy. She reports burning with urination, gross hematuria, and a foul odor that she compares to a fishy smell.  ? ?She denies any improvement on Gemtesa and amitriptyline.  She reports that she had been taking it for about 2 weeks.  ? ?She in the process of getting a new PCP.  ? ?Patient denies any modifying or aggravating factors.  Patient denies dysuria or suprapubic/flank pain.  Patient denies any fevers, chills, nausea or vomiting.  ? ?UA benign.   ? ? ?PMH: ?Past Medical History:  ?Diagnosis Date  ? Abnormal Pap smear of cervix   ? ADHD (attention deficit hyperactivity disorder)   ? Anxiety   ? Frequent headaches   ? Hypertension   ? Mitral valve prolapse   ? Spina bifida occulta   ? UTI (urinary tract infection)   ? ? ?Surgical History: ?Past Surgical History:  ?Procedure Laterality Date  ? LEEP    ? ? ?Home Medications:  ?Allergies as of 10/28/2021   ? ?   Reactions  ? Bactrim [sulfamethoxazole-trimethoprim] Anaphylaxis  ? Gadolinium Anaphylaxis  ? Gadolinium Derivatives Anaphylaxis  ? Iodinated Contrast Media Anaphylaxis  ? Iodine Anaphylaxis  ? Other Nausea And Vomiting, Rash, Hives  ? Uncoded Allergy. Allergen: Henderson Cloud ?Uncoded Allergy. Allergen: decongestants  ? Sulfa Antibiotics  Anaphylaxis, Other (See Comments)  ? Uncoded Allergy. Allergen: decongestants ?Uncoded Allergy. Allergen: decongestants  ? Azithromycin Hives  ? Clarithromycin Other (See Comments)  ? Furosemide Other (See Comments)  ? Guaifenesin Nausea And Vomiting  ? Macrobid [nitrofurantoin Monohyd Macro] Swelling  ? Tongue swelling, chest tightness   ? Nitrofurantoin Hives  ? Penicillins Hives  ? Amoxicillin Rash  ? Doxycycline Rash, Other (See Comments)  ? Keflex [cephalexin] Rash, Other (See Comments)  ? Levofloxacin Rash  ? ?  ? ?  ?Medication List  ?  ? ?  ? Accurate as of October 28, 2021  2:50 PM. If you have any questions, ask your nurse or doctor.  ?  ?  ? ?  ? ?STOP taking these medications   ? ?amitriptyline 50 MG tablet ?Commonly known as: ELAVIL ?Stopped by: Zara Council, PA-C ?  ?amphetamine-dextroamphetamine 10 MG tablet ?Commonly known as: ADDERALL ?Stopped by: Zara Council, PA-C ?  ?citalopram 20 MG tablet ?Commonly known as: CELEXA ?Stopped by: Zara Council, PA-C ?  ?Prempro 0.45-1.5 MG tablet ?Generic drug: estrogen (conjugated)-medroxyprogesterone ?Stopped by: Zara Council, PA-C ?  ? ?  ? ?TAKE these medications   ? ?acyclovir 400 MG tablet ?Commonly known as: ZOVIRAX ?Take 400 mg by mouth 3 (three) times daily. ?  ?albuterol 108 (90 Base) MCG/ACT inhaler ?Commonly known as: ProAir HFA ?Inhale 2 puffs into the lungs every 6 (six) hours  as needed for wheezing or shortness of breath. ?  ?ALPRAZolam 1 MG tablet ?Commonly known as: Duanne Moron ?Take 1 mg by mouth 3 (three) times daily as needed for anxiety. ?  ?cetirizine 10 MG tablet ?Commonly known as: ZYRTEC ?Take 1 tablet (10 mg total) by mouth daily. ?  ?fluticasone 50 MCG/ACT nasal spray ?Commonly known as: FLONASE ?SPRAY 2 SPRAYS INTO EACH NOSTRIL EVERY DAY ?  ?VITAMIN D PO ?Take 1 capsule by mouth in the morning and at bedtime. ?  ?Vitamin D3 125 MCG (5000 UT) Caps ?Take by mouth. ?  ? ?  ? ? ?Allergies:  ?Allergies  ?Allergen Reactions  ? Bactrim  [Sulfamethoxazole-Trimethoprim] Anaphylaxis  ? Gadolinium Anaphylaxis  ? Gadolinium Derivatives Anaphylaxis  ? Iodinated Contrast Media Anaphylaxis  ? Iodine Anaphylaxis  ? Other Nausea And Vomiting, Rash and Hives  ?  Uncoded Allergy. Allergen: Henderson Cloud ?Uncoded Allergy. Allergen: decongestants  ? Sulfa Antibiotics Anaphylaxis and Other (See Comments)  ?  Uncoded Allergy. Allergen: decongestants ?Uncoded Allergy. Allergen: decongestants ?  ? Azithromycin Hives  ? Clarithromycin Other (See Comments)  ? Furosemide Other (See Comments)  ? Guaifenesin Nausea And Vomiting  ? Macrobid [Nitrofurantoin Monohyd Macro] Swelling  ?  Tongue swelling, chest tightness   ? Nitrofurantoin Hives  ? Penicillins Hives  ? Amoxicillin Rash  ? Doxycycline Rash and Other (See Comments)  ? Keflex [Cephalexin] Rash and Other (See Comments)  ? Levofloxacin Rash  ? ? ?Family History: ?Family History  ?Problem Relation Age of Onset  ? Ovarian cancer Mother   ? Pancreatic cancer Mother   ? Diabetes Mellitus II Mother   ? Stroke Father   ? Heart disease Father   ? Cancer Father   ? Diabetes Mellitus II Father   ? Coronary artery disease Father   ? Mental illness Father   ? Anxiety disorder Sister   ? Mitral valve prolapse Sister   ? Heart attack Sister   ? Drug abuse Daughter   ? ? ?Social History:  reports that she has never smoked. She has never been exposed to tobacco smoke. She has never used smokeless tobacco. She reports that she does not currently use alcohol. She reports that she does not use drugs. ? ? ?Physical Exam: ?BP 140/83   Pulse 66   Ht '5\' 6"'$  (1.676 m)   Wt 184 lb (83.5 kg)   BMI 29.70 kg/m?   ?Constitutional:  Alert and oriented, No acute distress. ?HEENT: [place] AT, moist mucus membranes.  Trachea midline ?Cardiovascular: No clubbing, cyanosis, or edema. ?Respiratory: Normal respiratory effort, no increased work of breathing. ?Neurologic: Grossly intact, no focal deficits, moving all 4 extremities. ?Psychiatric: Normal mood  and affect. ? ?Laboratory Data: ?Lab Results  ?Component Value Date  ? CREATININE 0.75 07/18/2021  ? ?Component ?    Latest Ref Rng 07/18/2021  ?Sodium ?    135 - 145 mmol/L 137   ?Potassium ?    3.5 - 5.1 mmol/L 3.4 (L)   ?Chloride ?    98 - 111 mmol/L 104   ?CO2 ?    22 - 32 mmol/L 27   ?Glucose ?    70 - 99 mg/dL 90   ?BUN ?    6 - 20 mg/dL 9   ?Creatinine ?    0.44 - 1.00 mg/dL 0.75   ?Calcium ?    8.9 - 10.3 mg/dL 9.3   ?Anion gap ?    5 - 15  6   ?Total Protein ?  6.5 - 8.1 g/dL 7.6   ?Albumin ?    3.5 - 5.0 g/dL 4.6   ?AST ?    15 - 41 U/L 24   ?ALT ?    0 - 44 U/L 25   ?Alkaline Phosphatase ?    38 - 126 U/L 101   ?Total Bilirubin ?    0.3 - 1.2 mg/dL 0.6   ?GFR, Estimated ?    >60 mL/min >60   ?  ?(L) Low ? ?Urinalysis ?Results for orders placed or performed during the hospital encounter of 10/28/21  ?Urinalysis, Complete w Microscopic  ?Result Value Ref Range  ? Color, Urine YELLOW YELLOW  ? APPearance CLEAR CLEAR  ? Specific Gravity, Urine 1.010 1.005 - 1.030  ? pH 6.0 5.0 - 8.0  ? Glucose, UA NEGATIVE NEGATIVE mg/dL  ? Hgb urine dipstick TRACE (A) NEGATIVE  ? Bilirubin Urine NEGATIVE NEGATIVE  ? Ketones, ur NEGATIVE NEGATIVE mg/dL  ? Protein, ur NEGATIVE NEGATIVE mg/dL  ? Nitrite NEGATIVE NEGATIVE  ? Leukocytes,Ua NEGATIVE NEGATIVE  ? Squamous Epithelial / LPF 0-5 0 - 5  ? WBC, UA 0-5 0 - 5 WBC/hpf  ? RBC / HPF 0-5 0 - 5 RBC/hpf  ? Bacteria, UA FEW (A) NONE SEEN  ?I have reviewed the labs.  ? ?Assessment & Plan:   ? ?Presumed UTI/pelvic pain  ?- attempted to educate her regarding her UA results and her symptoms no likely being the result of an infection ?- explained that she needs to take the amitriptyline consistently for weeks before expecting any improvement  ?- Urinalysis today showed few bacteria and trace Hgb but was otherwise unremarkable and not suspicious for infection ?- Will send urine for culture to rule out UTI.  ? ?Return if symptoms worsen or fail to improve. ? ?[facility] ?[address], [address] ?[place], [place] [address] ?[phone] ? ?I,Tel Hevia,acting as a scribe for [facility], PA-C.,have documented all relevant documentation on the behalf

## 2021-10-28 ENCOUNTER — Encounter: Payer: Self-pay | Admitting: Urology

## 2021-10-28 ENCOUNTER — Ambulatory Visit: Payer: Medicaid Other | Admitting: Urology

## 2021-10-28 ENCOUNTER — Other Ambulatory Visit
Admission: RE | Admit: 2021-10-28 | Discharge: 2021-10-28 | Disposition: A | Discharge status: Home / Self Care | Payer: Medicaid Other | Attending: Urology | Admitting: Urology

## 2021-10-28 ENCOUNTER — Other Ambulatory Visit: Payer: Self-pay | Admitting: *Deleted

## 2021-10-28 VITALS — BP 140/83 | HR 66 | Ht 66.0 in | Wt 184.0 lb

## 2021-10-28 DIAGNOSIS — R350 Frequency of micturition: Secondary | ICD-10-CM

## 2021-10-28 DIAGNOSIS — R102 Pelvic and perineal pain: Secondary | ICD-10-CM | POA: Diagnosis not present

## 2021-10-28 LAB — URINALYSIS, COMPLETE (UACMP) WITH MICROSCOPIC
Bilirubin Urine: NEGATIVE
Glucose, UA: NEGATIVE mg/dL
Ketones, ur: NEGATIVE mg/dL
Leukocytes,Ua: NEGATIVE
Nitrite: NEGATIVE
Protein, ur: NEGATIVE mg/dL
Specific Gravity, Urine: 1.01 (ref 1.005–1.030)
pH: 6 (ref 5.0–8.0)

## 2021-10-30 LAB — URINE CULTURE: Culture: NO GROWTH

## 2021-11-05 ENCOUNTER — Ambulatory Visit: Payer: Self-pay | Admitting: *Deleted

## 2021-11-05 NOTE — Telephone Encounter (Signed)
Pt decided she did not want a call back, she made an appt for Friday. ?

## 2021-11-08 ENCOUNTER — Ambulatory Visit: Payer: Medicaid Other | Admitting: Family Medicine

## 2021-11-08 ENCOUNTER — Ambulatory Visit: Payer: Self-pay
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
## 2021-11-11 ENCOUNTER — Ambulatory Visit: Payer: Self-pay
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
## 2021-11-12 ENCOUNTER — Encounter: Payer: Self-pay | Admitting: Family Medicine

## 2021-11-12 ENCOUNTER — Ambulatory Visit: Payer: Medicaid Other | Admitting: Family Medicine

## 2021-11-12 VITALS — BP 126/74 | HR 90 | Ht 66.0 in | Wt 184.6 lb

## 2021-11-12 DIAGNOSIS — N3011 Interstitial cystitis (chronic) with hematuria: Secondary | ICD-10-CM | POA: Diagnosis not present

## 2021-11-12 DIAGNOSIS — Z872 Personal history of diseases of the skin and subcutaneous tissue: Secondary | ICD-10-CM

## 2021-11-12 DIAGNOSIS — R300 Dysuria: Secondary | ICD-10-CM

## 2021-11-12 LAB — POCT URINALYSIS DIPSTICK
Bilirubin, UA: NEGATIVE
Glucose, UA: NEGATIVE
Ketones, UA: NEGATIVE
Nitrite, UA: NEGATIVE
Protein, UA: POSITIVE — AB
Spec Grav, UA: 1.01 (ref 1.010–1.025)
Urobilinogen, UA: 0.2 E.U./dL
pH, UA: 5 (ref 5.0–8.0)

## 2021-11-12 MED ORDER — CLINDAMYCIN HCL 300 MG PO CAPS: 300.0000 mg | ORAL_CAPSULE | Freq: Three times a day (TID) | ORAL | 0 refills | Status: DC

## 2021-11-12 NOTE — Patient Instructions (Addendum)
Thank you for coming to the office today. ? ?Let me know when ready for the referral to GI ? ?[facility]) ?[place], [place] [address] ?Phone: [phone] ? ?I don't see any signs of cellulitis - you can have the Clindamycin as a back up precaution. ? ?Urine only has trace results. Keep up with Urology. ? ?Results for orders placed or performed in visit on 11/12/21  ?POCT Urinalysis Dipstick  ?Result Value Ref Range  ? Color, UA Yellow   ? Clarity, UA Clear   ? Glucose, UA Negative Negative  ? Bilirubin, UA Negative   ? Ketones, UA Negative   ? Spec Grav, UA 1.010 1.010 - 1.025  ? Blood, UA Trace   ? pH, UA 5.0 5.0 - 8.0  ? Protein, UA Positive (A) Negative  ? Urobilinogen, UA 0.2 0.2 or 1.0 E.U./dL  ? Nitrite, UA Negative   ? Leukocytes, UA Trace (A) Negative  ? Appearance    ? Odor    ? ? ?Please schedule a Follow-up Appointment to: Return if symptoms worsen or fail to improve. ? ?If you have any other questions or concerns, please feel free to call the office or send a message through [facility]. You may also schedule an earlier appointment if necessary. ? ?Additionally, you may be receiving a survey about your experience at our office within a few days to 1 week by e-mail or mail. We value your feedback. ? ?[name], DO ?[facility] ?
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
## 2021-12-03 ENCOUNTER — Encounter: Payer: Self-pay | Admitting: Obstetrics and Gynecology

## 2021-12-03 ENCOUNTER — Ambulatory Visit: Payer: Medicaid Other | Admitting: Obstetrics and Gynecology

## 2021-12-03 VITALS — BP 121/49 | HR 78 | Resp 16 | Ht 66.0 in | Wt 186.4 lb

## 2021-12-03 DIAGNOSIS — R140 Abdominal distension (gaseous): Secondary | ICD-10-CM

## 2021-12-03 DIAGNOSIS — N83201 Unspecified ovarian cyst, right side: Secondary | ICD-10-CM | POA: Diagnosis not present

## 2021-12-03 NOTE — Progress Notes (Signed)
? ? ?  GYNECOLOGY PROGRESS NOTE ? ?Subjective:  ? ? Patient ID: Shirley [name], female    DOB: Jul 23, 1971, 51 y.o.   MRN: [id number] ? ?HPI ? ?Patient is a 51 y.o. [id number] female who presents for follow up on right ovarian cyst. Last seen on 04/03/21 with concerns of cystitis and abd pain. She has a history of a persistent cyst for several years. She is considering surgery to remove cyst. Considered surgery last year but notes she had significant life events that got in the way. She continues to have abdominal pain and bloating. Wonders if she needs another ultrasound to check on the cyst.  ? ? ?The following portions of the patient's history were reviewed and updated as appropriate: allergies, current medications, past family history, past medical history, past social history, past surgical history, and problem list. ? ?Review of Systems ?Pertinent items noted in HPI and remainder of comprehensive ROS otherwise negative.  ? ?Objective:  ? Blood pressure (!) 121/49, pulse 78, resp. rate 16, height '5\' 6"'$  (1.676 m), weight 186 lb 6.4 oz (84.6 kg).  Body mass index is 30.09 kg/m?. ?General appearance: alert and no distress ?Abdomen: soft, non-tender; bowel sounds normal; no masses,  no organomegaly ?Pelvic: deferred ?Extremities: extremities normal, atraumatic, no cyanosis or edema ?Neurologic: Grossly normal ? ? ? ?Imaging:  ?Narrative & Impression  ?Patient Name: Shirley [name] ?DOB: 28-Feb-1971 ?MRN: [id number] ?ULTRASOUND REPORT ?  ?Location: [facility] ?Date of Service: 09/12/2020  ?  ?  ?  ?Indications:Pelvic Pain  ?  ?Findings:  ?The uterus is anteverted and measures 8.1 x 3.9 x 4.7. ?Echo texture is homogenous without evidence of focal masses. ?  ?The Endometrium measures 5 mm. ?  ?Right Ovary measures 6.7 x 5.0 x 5.6 cm. It is normal in appearance. Hypoechoic lesion with smooth walls and good through transmission measuring 5.0 x 4.3 x 4.2 cm (minimal change from prior imaging of cyst in 2019).  ?Left Ovary  measures 3.0 x 3.0 x 2.2 cm. It is normal in appearance. ?Survey of the adnexa demonstrates no adnexal masses. ?There is no free fluid in the cul de sac. ?  ?Impression: ?1. Rt ovarian simple cyst as described above. ?  ?Recommendations: ?1.Clinical correlation with the patient's History and Physical Exam. ?  ?  ?Jenine M. Albertine Grates    RDMS ?  ?  ?I have reviewed this study and agree with documented findings.  ?  ?  ?Rubie Maid, MD ?[facility]  ? ?Assessment:  ? ?1. Cyst of right ovary   ?2. Abdominal bloating   ?  ? ?Plan:  ? ?-  Patient desires ultrasound to reassess cyst. Notes that if it has changed in size, would like to proceed with surgery at some point. Ultrasound ordered. Will notify of results via [place].  ? ? ?Rubie Maid, MD ?[facility] ? ?

## 2021-12-11 ENCOUNTER — Emergency Department: Payer: Medicaid Other

## 2021-12-11 ENCOUNTER — Emergency Department
Admission: EM | Admit: 2021-12-11 | Discharge: 2021-12-11 | Disposition: A | Discharge status: Home / Self Care | Payer: Medicaid Other | Attending: Emergency Medicine | Admitting: Emergency Medicine

## 2021-12-11 ENCOUNTER — Other Ambulatory Visit: Payer: Self-pay

## 2021-12-11 DIAGNOSIS — R0789 Other chest pain: Secondary | ICD-10-CM

## 2021-12-11 DIAGNOSIS — R0602 Shortness of breath: Secondary | ICD-10-CM | POA: Diagnosis not present

## 2021-12-11 DIAGNOSIS — F439 Reaction to severe stress, unspecified: Secondary | ICD-10-CM | POA: Diagnosis not present

## 2021-12-11 DIAGNOSIS — R079 Chest pain, unspecified: Secondary | ICD-10-CM | POA: Diagnosis not present

## 2021-12-11 LAB — BASIC METABOLIC PANEL
Anion gap: 7 (ref 5–15)
BUN: 13 mg/dL (ref 6–20)
CO2: 25 mmol/L (ref 22–32)
Calcium: 9.4 mg/dL (ref 8.9–10.3)
Chloride: 110 mmol/L (ref 98–111)
Creatinine, Ser: 0.77 mg/dL (ref 0.44–1.00)
GFR, Estimated: >60 mL/min (ref >60)
Glucose, Bld: 137 mg/dL — ABNORMAL HIGH (ref 70–99)
Potassium: 3.5 mmol/L (ref 3.5–5.1)
Sodium: 142 mmol/L (ref 135–145)

## 2021-12-11 LAB — CBC
HCT: 37.4 % (ref 36.0–46.0)
Hemoglobin: 12.4 g/dL (ref 12.0–15.0)
MCH: 29 pg (ref 26.0–34.0)
MCHC: 33.2 g/dL (ref 30.0–36.0)
MCV: 87.4 fL (ref 80.0–100.0)
Platelets: 283 10*3/uL (ref 150–400)
RBC: 4.28 MIL/uL (ref 3.87–5.11)
RDW: 12.4 % (ref 11.5–15.5)
WBC: 7.1 10*3/uL (ref 4.0–10.5)
nRBC: 0 % (ref 0.0–0.2)

## 2021-12-11 LAB — URINALYSIS, ROUTINE W REFLEX MICROSCOPIC
Bacteria, UA: NONE SEEN
Bilirubin Urine: NEGATIVE
Glucose, UA: NEGATIVE mg/dL
Ketones, ur: NEGATIVE mg/dL
Leukocytes,Ua: NEGATIVE
Nitrite: NEGATIVE
Protein, ur: NEGATIVE mg/dL
Specific Gravity, Urine: 1.011 (ref 1.005–1.030)
pH: 6 (ref 5.0–8.0)

## 2021-12-11 LAB — TROPONIN I (HIGH SENSITIVITY): Troponin I (High Sensitivity): 3 ng/L (ref <18)

## 2021-12-11 LAB — BRAIN NATRIURETIC PEPTIDE: B Natriuretic Peptide: 28.1 pg/mL (ref 0.0–100.0)

## 2021-12-11 NOTE — ED Triage Notes (Addendum)
Pt presents to ER c/o chest pain that started appx 1500 today.  Pt states pain is in center of chest and radiates to left arm and left side of jaw.  Pt states she has been very anxious and stressed recently.  Pt endorses some associated sob, and dizziness.  Pt states she has also had increased swelling in her legs and burning with urination.  Pt is A&O x4 at this time in NAD.   ?

## 2021-12-12 ENCOUNTER — Telehealth: Payer: Self-pay

## 2021-12-12 NOTE — Telephone Encounter (Signed)
Transition Care Management Follow-up Telephone Call Date of discharge and from where: 12/11/2021 from [facility] How have you been since you were released from the hospital? Patient stated that she is feeling about the same. Patient stated that she is going to follow up with PCP to get back on her medications for HTN Any questions or concerns? No  Items Reviewed: Did the pt receive and understand the discharge instructions provided? Yes  Medications obtained and verified? Yes  Other? No  Any new allergies since your discharge? No  Dietary orders reviewed? No Do you have support at home? Yes   Functional Questionnaire: (I = Independent and D = Dependent) ADLs: I  Bathing/Dressing- I  Meal Prep- I  Eating- I  Maintaining continence- I  Transferring/Ambulation- I  Managing Meds- I   Follow up appointments reviewed:  PCP Hospital f/u appt confirmed? No  Patient will call and schedule Specialist Hospital f/u appt confirmed? Yes  Scheduled to see [facility] on 12/17/2021 @ 11:00am. Are transportation arrangements needed? No  If their condition worsens, is the pt aware to call PCP or go to the Emergency Dept.? Yes Was the patient provided with contact information for the PCP's office or ED? Yes Was to pt encouraged to call back with questions or concerns? Yes

## 2021-12-12 NOTE — ED Provider Notes (Signed)
[facility] Provider Note    Event Date/Time   First MD Initiated Contact with Patient 12/11/21 2104     (approximate)   History   Chest Pain   HPI  Shirley [name] is a 51 y.o. female who presents to the ED for evaluation of Chest Pain   Patient presents to the ED for evaluation of chronic chest pains.  She reports having chest pains for a long time, worse when she is dealing with her anxious son.  Her son is currently checked in as a separate patient for evaluation of a panic attack and will be spending the night in the ED and have psychiatry evaluate him and reassess in the morning.  She reports a lot of stress at home and reports feeling chest pain earlier today the setting of her son "panicking" and calling the cops because of his anxiety.  She reports feeling better now.   Physical Exam   Triage Vital Signs: ED Triage Vitals  Enc Vitals Group     BP 12/11/21 1944 (!) 150/86     Pulse Rate 12/11/21 1944 85     Resp 12/11/21 1944 20     Temp 12/11/21 1944 98.8 F (37.1 C)     Temp Source 12/11/21 1944 Oral     SpO2 12/11/21 1944 95 %     Weight --      Height --      Head Circumference --      Peak Flow --      Pain Score 12/11/21 1941 6     Pain Loc --      Pain Edu? --      Excl. in [place]? --     Most recent vital signs: Vitals:   12/11/21 2125 12/11/21 2200  BP: 138/75 126/80  Pulse: 81 88  Resp: 15 18  Temp:  98.3 F (36.8 C)  SpO2: 100% 100%    General: Awake, no distress.  Anxious CV:  Good peripheral perfusion. RRR.  No murmurs appreciated. Resp:  Normal effort.  CTA B Abd:  No distention.  Soft and benign throughout MSK:  No deformity noted.  No skin changes or signs of edema Neuro:  No focal deficits appreciated. Cranial nerves II through XII intact 5/5 strength and sensation in all 4 extremities Other:     ED Results / Procedures / Treatments   Labs (all labs ordered are listed, but only abnormal results are  displayed) Labs Reviewed  BASIC METABOLIC PANEL - Abnormal; Notable for the following components:      Result Value   Glucose, Bld 137 (*)    All other components within normal limits  URINALYSIS, ROUTINE W REFLEX MICROSCOPIC - Abnormal; Notable for the following components:   Color, Urine YELLOW (*)    APPearance CLEAR (*)    Hgb urine dipstick MODERATE (*)    All other components within normal limits  CBC  BRAIN NATRIURETIC PEPTIDE  TROPONIN I (HIGH SENSITIVITY)    EKG Sinus rhythm, rate of 94 bpm.  Normal axis and intervals.  No clear evidence of acute ischemia.. Similar to EKG from December without significant changes  RADIOLOGY CXR interpreted by me without evidence of acute cardiopulmonary pathology.  Official radiology report(s): DG Chest 2 View  Result Date: 12/11/2021 CLINICAL DATA:  Chest pain and shortness of breath. EXAM: CHEST - 2 VIEW COMPARISON:  PA Lat 01/31/2020 FINDINGS: The heart size and mediastinal contours are within normal limits. Both lungs  are clear. The visualized skeletal structures are unremarkable apart from a chronic healed fracture deformity of the mid shaft left clavicle. IMPRESSION: No evidence of acute chest disease or interval changes. Electronically Signed   By: Telford Nab M.D.   On: 12/11/2021 20:05    PROCEDURES and INTERVENTIONS:  .1-3 Lead EKG Interpretation Performed by: Vladimir Crofts, MD Authorized by: Vladimir Crofts, MD     Interpretation: normal     ECG rate:  80   ECG rate assessment: normal     Rhythm: sinus rhythm     Ectopy: none     Conduction: normal    Medications - No data to display   IMPRESSION / MDM / [place] / ED COURSE  I reviewed the triage vital signs and the nursing notes.  Differential diagnosis includes, but is not limited to, PE, ACS, NSTEMI, anxiety, PTX  {Patient presents with symptoms of an acute illness or injury that is potentially life-threatening.  51 year old female without cardiac  history presents to the ED with atypical chest pain, possibly due to anxiety and social stressors and suitable for outpatient management.  She is anxious, but without evidence of psychiatric emergency herself.  EKG is nonischemic and work-up is reassuring without evidence of ACS.  Negative troponin.  CXR is clear.  Normal CBC, metabolic panel and BNP.  I considered observation admission for this patient but ultimately we decided on outpatient management with close return precautions.      FINAL CLINICAL IMPRESSION(S) / ED DIAGNOSES   Final diagnoses:  Other chest pain     Rx / DC Orders   ED Discharge Orders     None        Note:  This document was prepared using Dragon voice recognition software and may include unintentional dictation errors.   Vladimir Crofts, MD 12/12/21 [phone]

## 2021-12-17 ENCOUNTER — Other Ambulatory Visit: Payer: Medicaid Other

## 2021-12-25 ENCOUNTER — Other Ambulatory Visit: Payer: Medicaid Other

## 2021-12-25 ENCOUNTER — Ambulatory Visit: Payer: Self-pay

## 2021-12-25 NOTE — Telephone Encounter (Signed)
  Chief Complaint: arm pain Symptoms: R arm brachial area pain and slight swelling and small knot above blood draw site Frequency: came up yesterday. Had labs drawn 12/11/21 Pertinent Negatives: NA Disposition: '[]'$ ED /'[]'$ Urgent Care (no appt availability in office) / '[x]'$ Appointment(In office/virtual)/ '[]'$  [place] [facility]/ '[]'$ Home Care/ '[]'$ Refused Recommended Disposition /'[]'$ [place] [facility]/ '[]'$  Follow-up with PCP Additional Notes: pt wanted to come in for appt to be seen since she is concerned but no available appts today. Called and spoke with Rachell, [place] who states if pt wants to be seen she would have to go in available spot on Friday. States no other appts available. Pt states she would try to get provider who is doing surgery consult tomorrow to look at it. I advised her if she wanted to do that that was fine and scheduled her for an appt on 12/27/21 at 1500 and let her know if she didn't need the appt, she could call to cancel. Pt verbalized understanding.   Reason for Disposition  [1] Small area of LOCALIZED swelling AND [2] not better after 3 days  Answer Assessment - Initial Assessment Questions 1. ONSET: "When did the swelling start?" (e.g., minutes, hours, days)     Yesterday  2. LOCATION: "What part of the arm is swollen?"  "Are both arms swollen or just one arm?"     Brachial area  3. SEVERITY: "How bad is the swelling?" (e.g., localized; mild, moderate, severe)   - LOCALIZED: Small area of puffiness or swelling on just one arm   - JOINT SWELLING: Swelling of one joint   - MILD: Puffiness or swelling of hand   - MODERATE: Puffiness or swollen feeling of entire arm    - SEVERE: All of arm looks swollen; pitting edema     Mild  4. REDNESS: "Does the swelling look red or infected?"     Yes  5. PAIN: "Is the swelling painful to touch?" If Yes, ask: "How painful is it?"   (Scale 1-10; mild, moderate or severe)     6 7. CAUSE: "What do you think is causing the arm swelling?"      After blood draw 10. OTHER SYMPTOMS: "Do you have any other symptoms?" (e.g., chest pain, difficulty breathing)       no  Protocols used: Arm Swelling and Edema-A-AH

## 2021-12-27 ENCOUNTER — Other Ambulatory Visit (INDEPENDENT_AMBULATORY_CARE_PROVIDER_SITE_OTHER): Payer: Medicaid Other

## 2021-12-27 ENCOUNTER — Telehealth: Payer: Self-pay | Admitting: Obstetrics and Gynecology

## 2021-12-27 ENCOUNTER — Ambulatory Visit: Payer: Medicaid Other | Admitting: Family Medicine

## 2021-12-27 ENCOUNTER — Other Ambulatory Visit: Payer: Self-pay | Admitting: Obstetrics and Gynecology

## 2021-12-27 DIAGNOSIS — R140 Abdominal distension (gaseous): Secondary | ICD-10-CM

## 2021-12-27 DIAGNOSIS — N83201 Unspecified ovarian cyst, right side: Secondary | ICD-10-CM

## 2021-12-27 NOTE — Telephone Encounter (Signed)
error 

## 2021-12-30 ENCOUNTER — Other Ambulatory Visit: Payer: Self-pay | Admitting: Urology

## 2022-01-01 ENCOUNTER — Encounter: Payer: Self-pay | Admitting: Family Medicine

## 2022-01-01 ENCOUNTER — Telehealth (INDEPENDENT_AMBULATORY_CARE_PROVIDER_SITE_OTHER): Payer: Medicaid Other | Admitting: Family Medicine

## 2022-01-01 ENCOUNTER — Ambulatory Visit: Payer: Self-pay | Admitting: *Deleted

## 2022-01-01 VITALS — Ht 66.0 in | Wt 186.0 lb

## 2022-01-01 DIAGNOSIS — I10 Essential (primary) hypertension: Secondary | ICD-10-CM | POA: Insufficient documentation

## 2022-01-01 MED ORDER — VALSARTAN 80 MG PO TABS: 80.0000 mg | ORAL_TABLET | Freq: Every day | ORAL | 1 refills | Status: DC

## 2022-01-01 NOTE — Assessment & Plan Note (Signed)
Elevated BP with headache - Home BP readings ranging 140-180 / 48-250  No known complications  Stress is major factor  Previous therapy Lisinopril '10mg'$  and HCTZ 12.'5mg'$   Plan:  1. RESTART therapy will switch to ARB Valsartan '80mg'$  daily now - If not successful we can double dose to 160 or add HCTZ 12.'5mg'$  2. Encourage improved lifestyle - low sodium diet, regular exercise 3. Continue monitor BP outside office, bring readings to next visit, if persistently >140/90 or new symptoms notify office sooner

## 2022-01-01 NOTE — Telephone Encounter (Signed)
Reason for Disposition  Systolic BP  >= 008 OR Diastolic >= 676  Answer Assessment - Initial Assessment Questions 1. BLOOD PRESSURE: "What is the blood pressure?" "Did you take at least two measurements 5 minutes apart?"     180/100 last night.   I went to EMT and they checked my BP for me.   I had a bad headache and I got dizzy in [facility] yesterday.   EMT told me to go to my dr. 2. ONSET: "When did you take your blood pressure?"     Yesterday 3. HOW: "How did you obtain the blood pressure?" (e.g., visiting nurse, automatic home BP monitor)     EMT 4. HISTORY: "Do you have a history of high blood pressure?"     Yes 5. MEDICATIONS: "Are you taking any medications for blood pressure?" "Have you missed any doses recently?"     I'm been off my BP medications for a while. 6. OTHER SYMPTOMS: "Do you have any symptoms?" (e.g., headache, chest pain, blurred vision, difficulty breathing, weakness)     Headache, dizziness and nausea over the last 2 months but it's getting worse since I'm off my BP medication.   I'm under a lot of stress with my son.    7. PREGNANCY: "Is there any chance you are pregnant?" "When was your last menstrual period?"     N/A  Protocols used: Blood Pressure - High-A-AH

## 2022-01-01 NOTE — Telephone Encounter (Addendum)
  Chief Complaint: BP 180/100 last night,  bad headache, dizziness, unbalanced.  Been off BP medications "for a while" Symptoms:  over the last 2 months but has gotten worse over the past couple of days. Frequency: Last night is when she went to EMT and had her BP checked because she was dizzy and had a bad headache Pertinent Negatives: Patient denies having a headache this morning. Disposition: '[]'$ ED /'[]'$ Urgent Care (no appt availability in office) / '[x]'$ Appointment(In office/virtual)/ '[]'$  [place] [facility]/ '[]'$ Home Care/ '[]'$ Refused Recommended Disposition /'[]'$ [place] [facility]/ '[]'$  Follow-up with PCP Additional Notes: I let her know she needed to go to the ED with the symptoms she was having.   She refused ED.  She requested a video visit instead due to situation with her son causing a lot of stress.    She doesn't have transportation.  I went over s/s to call 911 if they occurred or if her symptoms came back today.  I called into [facility] and spoke to [place].   I let her know the situation and she is going to let Dr. Parks Ranger and his nurse know what is going on and let them decide on the next plan of action.

## 2022-01-01 NOTE — Telephone Encounter (Signed)
Ultimately it is up to the patient, I would not refuse care to her. So I can see her if she absolutely prefers virtual visit at 4pm today  Please review that it would be preferred that she is evaluated in person, based on her symptoms - technically hospital ED or Urgent care is safest.  If she declining that, we could see her in person.  If still prefers video due to transportation, we can do that and see what I can offer to help  We may have to advise her to go get seen after the visit.  I can re order medicine if she needs BP med again.  [name], [facility] 01/01/2022, 10:16 AM

## 2022-01-01 NOTE — Patient Instructions (Addendum)
Thank you for coming to the office today.  Valsartan '80mg'$  daily. If not strong enough we can add something to it with fluid pill HCTZ   Please schedule a Follow-up Appointment to: Return in about 3 months (around 04/03/2022) for 3 month follow-up HTN, stress.  If you have any other questions or concerns, please feel free to call the office or send a message through [place]. You may also schedule an earlier appointment if necessary.  Additionally, you may be receiving a survey about your experience at our office within a few days to 1 week by e-mail or mail. We value your feedback.  [name], DO [place]

## 2022-01-01 NOTE — Progress Notes (Signed)
Subjective:    Patient ID: Shirley [name], female    DOB: 1970-09-17, 51 y.o.   MRN: [id number]  Shirley [name] is a 51 y.o. female presenting on 01/01/2022 for Hypertension and Headache  Virtual / Telehealth Encounter - Video Visit via MyChart The purpose of this virtual visit is to provide medical care while limiting exposure to the novel coronavirus (COVID19) for both patient and office staff.  Consent was obtained for remote visit:  Yes.   Answered questions that patient had about telehealth interaction:  Yes.   I discussed the limitations, risks, security and privacy concerns of performing an evaluation and management service by video/telephone. I also discussed with the patient that there may be a patient responsible charge related to this service. The patient expressed understanding and agreed to proceed.  Patient Location: Home Provider Location: [facility] (Office)  Participants in virtual visit: - Patient: Shirley [name] - CMA: [name], CMA - Provider: Dr Parks Ranger   HPI  CHRONIC HTN: Reports previous diagnosis years ago 2015. Admits stress anxiety causing elevated BP. Still has stress. She said can elevate with stress Current Meds - NONE Reports good compliance, took meds today. Tolerating well, w/o complaints. Fam history of HTN and Heart Disease Denies CP, dyspnea, edema, dizziness / lightheadedness  Home BP readings 180/122 multiple times also with headache.  Previously on BP medication Lisinopril '10mg'$  and HCTZ 12.'5mg'$  daily  149/80 and HR 81  Believes stress related.  Also exposed to stomach virus as well.      10/02/2021    2:19 PM 08/26/2021    3:56 PM 04/03/2021    1:56 PM  Depression screen PHQ 2/9  Decreased Interest 0 1 1  Down, Depressed, Hopeless '1 1 1  '$ PHQ - 2 Score '1 2 2  '$ Altered sleeping '1 3 3  '$ Tired, decreased energy '1 3 3  '$ Change in appetite 3 0 3  Feeling bad or failure about yourself  '1 1 3  '$ Trouble concentrating '3  2 3  '$ Moving slowly or fidgety/restless 0 0 2  Suicidal thoughts 0 0 0  PHQ-9 Score '10 11 19  '$ Difficult doing work/chores Not difficult at all Somewhat difficult Somewhat difficult    Social History   Tobacco Use   Smoking status: Never    Passive exposure: Never   Smokeless tobacco: Never  Vaping Use   Vaping Use: Never used  Substance Use Topics   Alcohol use: Not Currently   Drug use: Never    Review of Systems Per HPI unless specifically indicated above     Objective:    Ht '5\' 6"'$  (1.676 m)   Wt 186 lb (84.4 kg)   LMP  (LMP Unknown) Comment: last cycle over a year ago  BMI 30.02 kg/m   Wt Readings from Last 3 Encounters:  01/01/22 186 lb (84.4 kg)  12/03/21 186 lb 6.4 oz (84.6 kg)  11/12/21 184 lb 9.6 oz (83.7 kg)    Physical Exam  Note examination was completely remotely via video observation objective data only  Gen - well-appearing, no acute distress or apparent pain, comfortable HEENT - eyes appear clear without discharge or redness Heart/Lungs - cannot examine virtually - observed no evidence of coughing or labored breathing. Abd - cannot examine virtually  Skin - face visible today- no rash Neuro - awake, alert, oriented Psych - not anxious appearing   Results for orders placed or performed during the hospital encounter of 12/11/21  Basic  metabolic panel  Result Value Ref Range   Sodium 142 135 - 145 mmol/L   Potassium 3.5 3.5 - 5.1 mmol/L   Chloride 110 98 - 111 mmol/L   CO2 25 22 - 32 mmol/L   Glucose, Bld 137 (H) 70 - 99 mg/dL   BUN 13 6 - 20 mg/dL   Creatinine, Ser 0.77 0.44 - 1.00 mg/dL   Calcium 9.4 8.9 - 10.3 mg/dL   GFR, Estimated >60 >60 mL/min   Anion gap 7 5 - 15  CBC  Result Value Ref Range   WBC 7.1 4.0 - 10.5 K/uL   RBC 4.28 3.87 - 5.11 MIL/uL   Hemoglobin 12.4 12.0 - 15.0 g/dL   HCT 37.4 36.0 - 46.0 %   MCV 87.4 80.0 - 100.0 fL   MCH 29.0 26.0 - 34.0 pg   MCHC 33.2 30.0 - 36.0 g/dL   RDW 12.4 11.5 - 15.5 %   Platelets 283  150 - 400 K/uL   nRBC 0.0 0.0 - 0.2 %  Urinalysis, Routine w reflex microscopic  Result Value Ref Range   Color, Urine YELLOW (A) YELLOW   APPearance CLEAR (A) CLEAR   Specific Gravity, Urine 1.011 1.005 - 1.030   pH 6.0 5.0 - 8.0   Glucose, UA NEGATIVE NEGATIVE mg/dL   Hgb urine dipstick MODERATE (A) NEGATIVE   Bilirubin Urine NEGATIVE NEGATIVE   Ketones, ur NEGATIVE NEGATIVE mg/dL   Protein, ur NEGATIVE NEGATIVE mg/dL   Nitrite NEGATIVE NEGATIVE   Leukocytes,Ua NEGATIVE NEGATIVE   RBC / HPF 0-5 0 - 5 RBC/hpf   WBC, UA 0-5 0 - 5 WBC/hpf   Bacteria, UA NONE SEEN NONE SEEN   Squamous Epithelial / LPF 0-5 0 - 5   Mucus PRESENT   Brain natriuretic peptide  Result Value Ref Range   B Natriuretic Peptide 28.1 0.0 - 100.0 pg/mL  Troponin I (High Sensitivity)  Result Value Ref Range   Troponin I (High Sensitivity) 3 <18 ng/L      Assessment & Plan:   Problem List Items Addressed This Visit     Essential hypertension - Primary    Elevated BP with headache - Home BP readings ranging 140-180 / 16-109  No known complications  Stress is major factor  Previous therapy Lisinopril '10mg'$  and HCTZ 12.'5mg'$   Plan:  1. RESTART therapy will switch to ARB Valsartan '80mg'$  daily now - If not successful we can double dose to 160 or add HCTZ 12.'5mg'$  2. Encourage improved lifestyle - low sodium diet, regular exercise 3. Continue monitor BP outside office, bring readings to next visit, if persistently >140/90 or new symptoms notify office sooner       Relevant Medications   valsartan (DIOVAN) 80 MG tablet    Meds ordered this encounter  Medications   valsartan (DIOVAN) 80 MG tablet    Sig: Take 1 tablet (80 mg total) by mouth daily.    Dispense:  90 tablet    Refill:  1     Follow up plan: Return in about 3 months (around 04/03/2022) for 3 month follow-up HTN, stress.  Future will return for labs once her BP is resolved.  Patient verbalizes understanding with the above medical  recommendations including the limitation of remote medical advice.  Specific follow-up and call-back criteria were given for patient to follow-up or seek medical care more urgently if needed.  Total duration of direct patient care provided via video conference: 10 minutes   [name], [facility] 01/01/2022, 4:15 PM

## 2022-01-20 ENCOUNTER — Ambulatory Visit
Admission: EM | Admit: 2022-01-20 | Discharge: 2022-01-20 | Disposition: A | Discharge status: Home / Self Care | Payer: Medicaid Other | Attending: Emergency Medicine | Admitting: Emergency Medicine

## 2022-01-20 DIAGNOSIS — L03115 Cellulitis of right lower limb: Secondary | ICD-10-CM

## 2022-01-20 MED ORDER — CLINDAMYCIN HCL 300 MG PO CAPS: 300.0000 mg | ORAL_CAPSULE | Freq: Three times a day (TID) | ORAL | 0 refills | Status: AC

## 2022-01-28 IMAGING — CR DG ABDOMEN 1V
2 series · 2 of 2 positions shown · non-contrast
Comparison: Noncontrast CT 11/20/2020

CLINICAL DATA: Assess stones. Patient reports burning with
urination for 2 weeks.

EXAM:
ABDOMEN - 1 VIEW

[abdomen kub (1 of 2)]
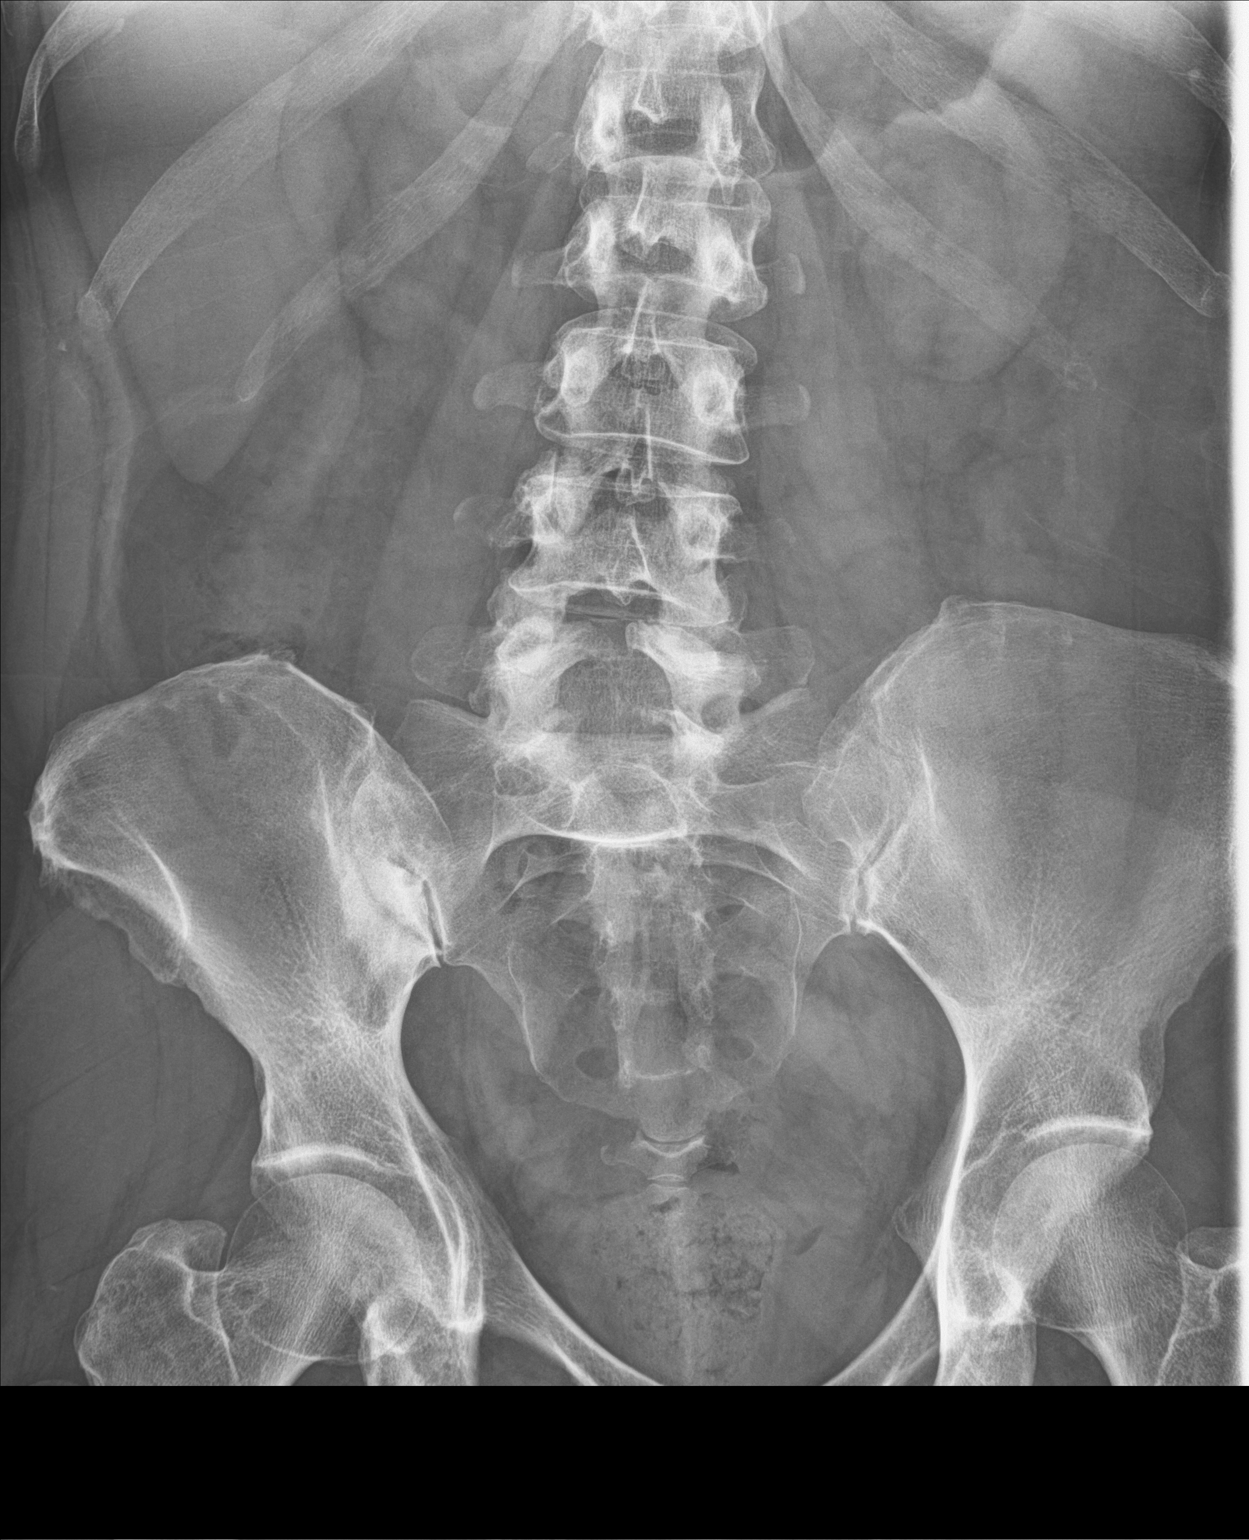

[abdomen kub (2 of 2)]
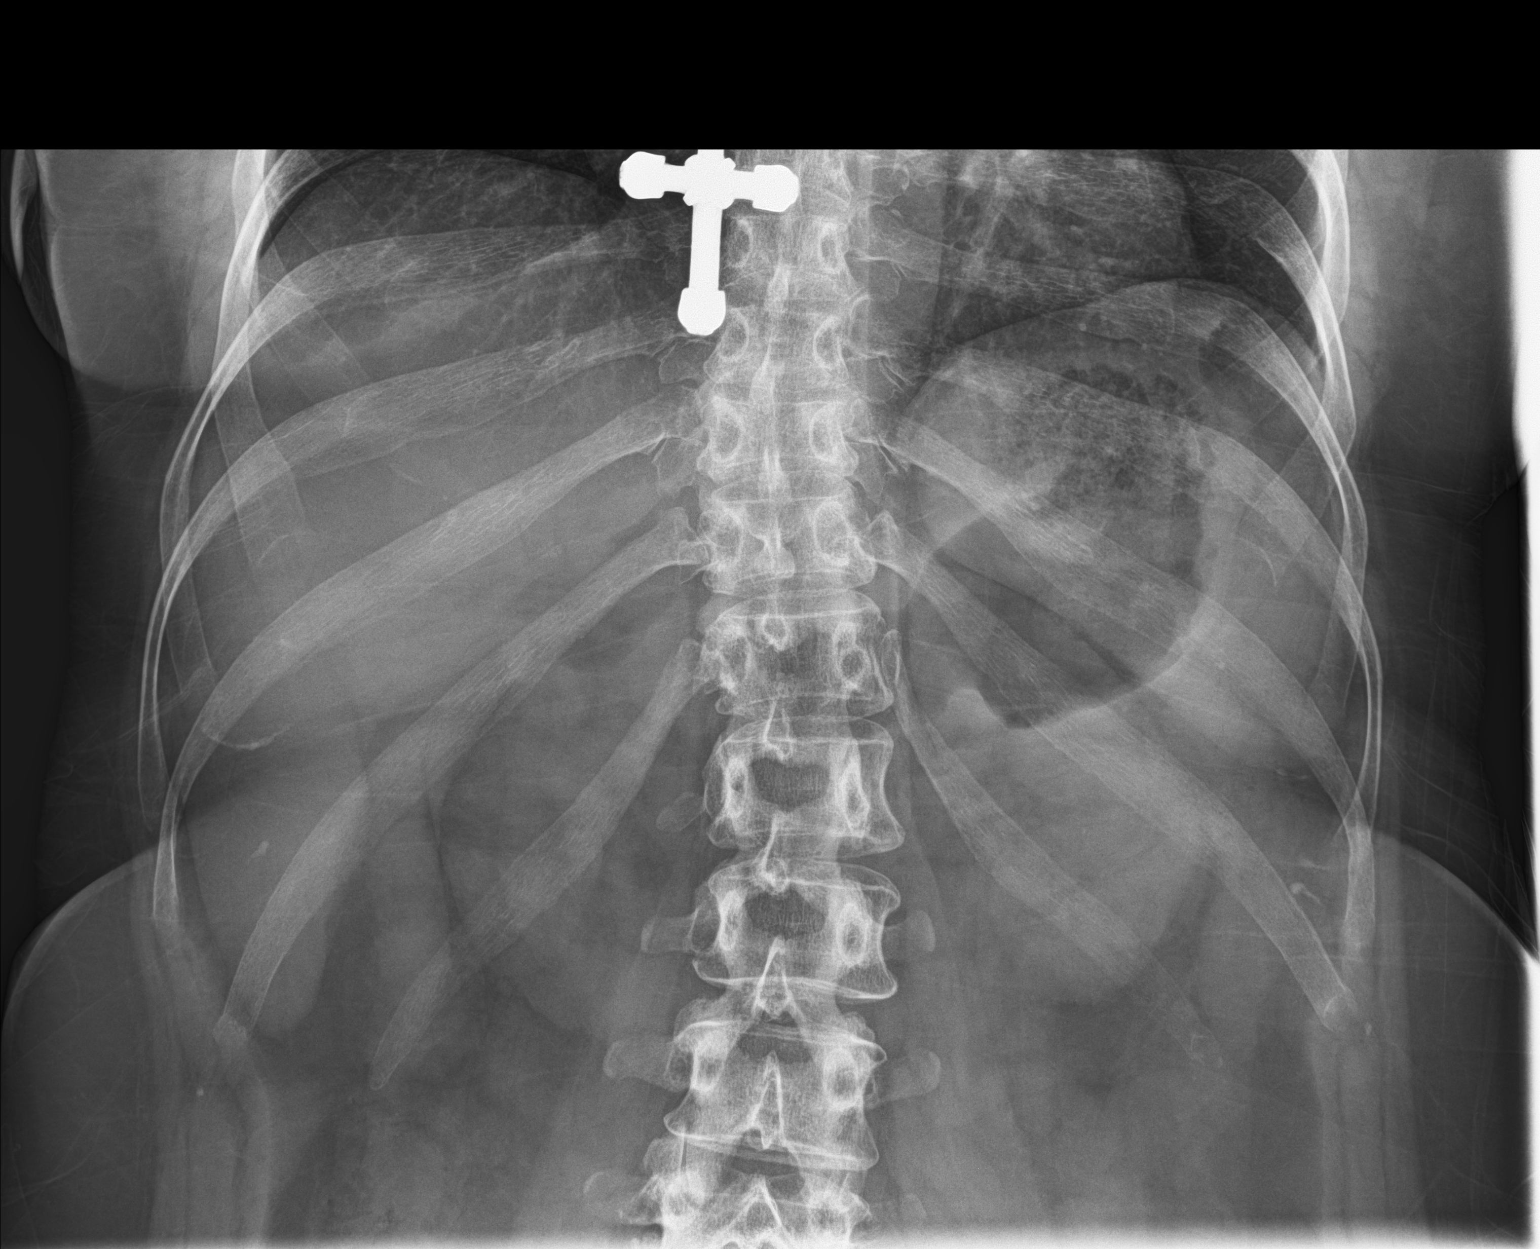

[2 of 2 positions shown; findings below may reference images not displayed]

FINDINGS: Previous pelvic calcification corresponds to stone in the urinary
bladder is not definitively seen. No stones over the renal beds or
course of the ureters. Normal bowel gas pattern with small volume of
stool. No concerning intraabdominal mass effect. Lung bases are
clear. Similar lumbar scoliosis.
IMPRESSION: Previous bladder stone is not definitively seen. No evidence of
urolithiasis on the current exam.

## 2022-02-03 ENCOUNTER — Ambulatory Visit: Payer: Self-pay

## 2022-02-03 ENCOUNTER — Encounter: Payer: Self-pay | Admitting: Physician Assistant

## 2022-02-03 ENCOUNTER — Ambulatory Visit: Payer: Medicaid Other | Admitting: Physician Assistant

## 2022-02-03 VITALS — BP 148/74 | HR 94 | Temp 98.2°F | Wt 181.0 lb

## 2022-02-03 DIAGNOSIS — J0100 Acute maxillary sinusitis, unspecified: Secondary | ICD-10-CM | POA: Diagnosis not present

## 2022-02-03 MED ORDER — CLINDAMYCIN HCL 300 MG PO CAPS: 300.0000 mg | ORAL_CAPSULE | Freq: Three times a day (TID) | ORAL | 0 refills | Status: AC

## 2022-02-03 NOTE — Progress Notes (Signed)
Acute Office Visit   Patient: Shirley [name]   DOB: March 02, 1971   51 y.o. Female  MRN: [id number] Visit Date: 02/03/2022  Today's healthcare provider: Dani Gobble Mumtaz Lovins, PA-C  Introduced myself to the patient as a Journalist, newspaper and provided education on APPs in clinical practice.    No chief complaint on file.  Subjective    HPI   Congestion Extensive abx allergies- review prior to rx- clindamycin appears to be only appropriate med that she isn't allergic to States this started a few days after July 4th Reports she has sore throat States she has productive cough with green sputum every morning States she has globus sensation and feels like she has lymph node swelling Denies fevers COVID testing at home: tested twice and both tests were negative Interventions: Delsym but no relief    Medications: Outpatient Medications Prior to Visit  Medication Sig   acyclovir (ZOVIRAX) 400 MG tablet Take 400 mg by mouth 3 (three) times daily.   albuterol (PROAIR HFA) 108 (90 Base) MCG/ACT inhaler Inhale 2 puffs into the lungs every 6 (six) hours as needed for wheezing or shortness of breath.   ALPRAZolam (XANAX) 1 MG tablet Take 1 mg by mouth 3 (three) times daily as needed for anxiety.   amitriptyline (ELAVIL) 50 MG tablet TAKE 1 TABLET BY MOUTH EVERYDAY AT BEDTIME   cetirizine (ZYRTEC) 10 MG tablet Take 1 tablet (10 mg total) by mouth daily.   Cholecalciferol (VITAMIN D3) 125 MCG (5000 UT) CAPS Take by mouth.   fluticasone (FLONASE) 50 MCG/ACT nasal spray SPRAY 2 SPRAYS INTO EACH NOSTRIL EVERY DAY   valsartan (DIOVAN) 80 MG tablet Take 1 tablet (80 mg total) by mouth daily.   VITAMIN D PO Take 1 capsule by mouth in the morning and at bedtime.   No facility-administered medications prior to visit.    Review of Systems  Constitutional:  Positive for fatigue. Negative for chills and fever.  HENT:  Positive for congestion, ear pain, rhinorrhea and sore throat. Negative for postnasal drip,  sinus pressure and sinus pain.   Respiratory:  Positive for cough and chest tightness. Negative for shortness of breath.   Cardiovascular:  Negative for chest pain.  Gastrointestinal:  Positive for diarrhea and nausea. Negative for vomiting.  Musculoskeletal:  Positive for myalgias.  Neurological:  Negative for dizziness and headaches.       Objective    BP (!) 148/74 (BP Location: Left Arm, Patient Position: Sitting, Cuff Size: Normal)   Pulse 94   Temp 98.2 F (36.8 C) (Oral)   Wt 181 lb (82.1 kg)   LMP  (LMP Unknown) Comment: last cycle over a year ago  SpO2 97%   BMI 29.21 kg/m    Physical Exam Vitals reviewed.  Constitutional:      General: She is awake.     Appearance: Normal appearance. She is well-developed, well-groomed and overweight.  HENT:     Head: Normocephalic and atraumatic.     Right Ear: Ear canal and external ear normal. A middle ear effusion is present.     Left Ear: Ear canal and external ear normal. A middle ear effusion is present.     Mouth/Throat:     Mouth: Mucous membranes are moist.     Pharynx: Uvula midline. Posterior oropharyngeal erythema present. No pharyngeal swelling, oropharyngeal exudate or uvula swelling.     Tonsils: No tonsillar exudate or tonsillar abscesses.  Eyes:  General: Lids are normal. Gaze aligned appropriately.     Pupils: Pupils are equal, round, and reactive to light.  Cardiovascular:     Rate and Rhythm: Normal rate and regular rhythm.     Pulses: Normal pulses.          Radial pulses are 2+ on the right side and 2+ on the left side.     Heart sounds: Normal heart sounds.  Pulmonary:     Effort: Pulmonary effort is normal.     Breath sounds: Normal breath sounds. No decreased breath sounds, wheezing, rhonchi or rales.  Musculoskeletal:     Right lower leg: No edema.     Left lower leg: No edema.  Lymphadenopathy:     Head:     Right side of head: No submental or submandibular adenopathy.     Left side of head:  No submental or submandibular adenopathy.     Upper Body:     Right upper body: No supraclavicular adenopathy.     Left upper body: No supraclavicular adenopathy.  Neurological:     Mental Status: She is alert.  Psychiatric:        Attention and Perception: Attention and perception normal.        Mood and Affect: Mood and affect normal.        Speech: Speech normal.        Behavior: Behavior normal. Behavior is cooperative.       No results found for any visits on 02/03/22.  Assessment & Plan      No follow-ups on file.     Problem List Items Addressed This Visit   None Visit Diagnoses     Acute maxillary sinusitis, recurrence not specified    -  Primary Acute, new problem Patient reports symptoms comprised of congestion, productive cough, nausea, myalgias, ear pain for 8 days with worsening severity  Suspect bacterial etiology at this time given lack of improvement and consolidation of symptoms to sinuses and productive cough Reviewed Allergies with patient and Clindamycin is okay for her to take  Will send in script for Clindamycin 300 mg PO TID x 7 days She can take OTC medications as tolerated to assist further with symptoms - recommend Coricidin, nasal saline sprays, and antihistamines to assist with symptoms as tolerated and excluding allergies.  Follow up as needed.     Relevant Medications   clindamycin (CLEOCIN) 300 MG capsule        No follow-ups on file.   I, Sumner Kirchman E Somya Jauregui, PA-C, have reviewed all documentation for this visit. The documentation on 02/03/22 for the exam, diagnosis, procedures, and orders are all accurate and complete.   Talitha Givens, MHS, PA-C [facility]

## 2022-02-03 NOTE — Patient Instructions (Addendum)
I am sending in a script for Clindamycin for you to take three times per day for 7 days  To help relieve your symptoms you can take a few OTC medications to help with your symptoms Coricidin can help a bit - and usually does not raise the heart rate.   I recommend nasal saline sprays and a humidifier at night to assist with cough and nasal discomfort Let us know if you have further concerns or worsening symptoms.

## 2022-02-03 NOTE — Telephone Encounter (Signed)
  Chief Complaint: congestion Symptoms: productive cough, green thick bloodish mucus, sore throat, congestion in chest, SOB, fever, fatigue Frequency: 3-4 days  Pertinent Negatives: NA Disposition: '[]'$ ED /'[]'$ Urgent Care (no appt availability in office) / '[x]'$ Appointment(In office/virtual)/ '[]'$  [facility]/ '[]'$ Home Care/ '[]'$ Refused Recommended Disposition /'[]'$ [place] [facility]/ '[]'$  Follow-up with PCP Additional Notes: pt states she felt this was cold at first but unsure if she has bronchitis or PNA at this point. Pt states she is unable to take anything OTC for symptoms. Went to walk in UC on Saturday but left d/t 4 hr wait. Pt was asking for appt today. Advised no appt with Dr. Raliegh Ip but was able to schedule with Junie Panning, [place] today at 1540.   Marland KitchenReason for Disposition  [1] MILD difficulty breathing (e.g., minimal/no SOB at rest, SOB with walking, pulse <100) AND [2] still present when not coughing  Answer Assessment - Initial Assessment Questions 1. ONSET: "When did the cough begin?"      3-4 days  3. SPUTUM: "Describe the color of your sputum" (none, dry cough; clear, white, yellow, green)     Greenish and bloody 5. DIFFICULTY BREATHING: "Are you having difficulty breathing?" If Yes, ask: "How bad is it?" (e.g., mild, moderate, severe)    - MILD: No SOB at rest, mild SOB with walking, speaks normally in sentences, can lie down, no retractions, pulse < 100.    - MODERATE: SOB at rest, SOB with minimal exertion and prefers to sit, cannot lie down flat, speaks in phrases, mild retractions, audible wheezing, pulse 100-120.    - SEVERE: Very SOB at rest, speaks in single words, struggling to breathe, sitting hunched forward, retractions, pulse > 120      SOB  6. FEVER: "Do you have a fever?" If Yes, ask: "What is your temperature, how was it measured, and when did it start?"     Low grade  10. OTHER SYMPTOMS: "Do you have any other symptoms?" (e.g., runny nose, wheezing, chest pain) Runny nose,  congestion in chest  Protocols used: Cough - Acute Productive-A-AH

## 2022-02-05 ENCOUNTER — Encounter: Payer: Self-pay | Admitting: Family Medicine

## 2022-02-05 ENCOUNTER — Telehealth (INDEPENDENT_AMBULATORY_CARE_PROVIDER_SITE_OTHER): Payer: Medicaid Other | Admitting: Family Medicine

## 2022-02-05 ENCOUNTER — Other Ambulatory Visit: Payer: Self-pay | Admitting: Family Medicine

## 2022-02-05 VITALS — Ht 66.0 in | Wt 181.0 lb

## 2022-02-05 DIAGNOSIS — R7309 Other abnormal glucose: Secondary | ICD-10-CM

## 2022-02-05 DIAGNOSIS — Z0000 Encounter for general adult medical examination without abnormal findings: Secondary | ICD-10-CM

## 2022-02-05 DIAGNOSIS — Z114 Encounter for screening for human immunodeficiency virus [HIV]: Secondary | ICD-10-CM

## 2022-02-05 DIAGNOSIS — F4310 Post-traumatic stress disorder, unspecified: Secondary | ICD-10-CM

## 2022-02-05 DIAGNOSIS — H1032 Unspecified acute conjunctivitis, left eye: Secondary | ICD-10-CM | POA: Diagnosis not present

## 2022-02-05 DIAGNOSIS — I10 Essential (primary) hypertension: Secondary | ICD-10-CM

## 2022-02-05 DIAGNOSIS — E785 Hyperlipidemia, unspecified: Secondary | ICD-10-CM

## 2022-02-05 MED ORDER — ERYTHROMYCIN 5 MG/GM OP OINT: 1.0000 | TOPICAL_OINTMENT | Freq: Four times a day (QID) | OPHTHALMIC | 0 refills | Status: DC

## 2022-02-05 NOTE — Patient Instructions (Addendum)
   Please schedule a Follow-up Appointment to: Return in about 4 weeks (around 03/05/2022) for 4 weeks labs and annual physical.  If you have any other questions or concerns, please feel free to call the office or send a message through [place]. You may also schedule an earlier appointment if necessary.  Additionally, you may be receiving a survey about your experience at our office within a few days to 1 week by e-mail or mail. We value your feedback.  [name], DO [place]

## 2022-02-05 NOTE — Progress Notes (Addendum)
Subjective:    Patient ID: Shirley [name], female    DOB: 1971-05-31, 51 y.o.   MRN: [id number]  Shirley [name] is a 51 y.o. female presenting on 02/05/2022 for Conjunctivitis and Insect Bite  Virtual / Telehealth Encounter - Video Visit via [place] The purpose of this virtual visit is to provide medical care while limiting exposure to the novel coronavirus (COVID19) for both patient and office staff.  Consent was obtained for remote visit:  Yes.   Answered questions that patient had about telehealth interaction:  Yes.   I discussed the limitations, risks, security and privacy concerns of performing an evaluation and management service by video/telephone. I also discussed with the patient that there may be a patient responsible charge related to this service. The patient expressed understanding and agreed to proceed.  Patient Location: Home Provider Location: [facility] (Office)  Participants in virtual visit: - Patient: SHANEISHA BURKEL - CMA: [name], CMA - Provider: Dr Parks Ranger   HPI  Conjunctivitis, L eye Recently seen here on 7/10 by Erin Mecum PA for sinusitis, placed on Clindamycin, patient's history notable for multiple antibiotics allergies. She has improved but now has L pink eye with irritation red eye itchy drainage. She has been on erythromycin ointment ophthal in the past with improvement. She has tolerated it well. Last flare 1 year ago.  Also admits tick bite on lower extremity 3 days ago, approx. Tick on for < 24 hours. Removed. Localized redness and itching. No spreading rash or fever or other concerns. She is allergic to doxycycline     10/02/2021    2:19 PM 08/26/2021    3:56 PM 04/03/2021    1:56 PM  Depression screen PHQ 2/9  Decreased Interest 0 1 1  Down, Depressed, Hopeless '1 1 1  '$ PHQ - 2 Score '1 2 2  '$ Altered sleeping '1 3 3  '$ Tired, decreased energy '1 3 3  '$ Change in appetite 3 0 3  Feeling bad or failure about yourself  '1 1 3   '$ Trouble concentrating '3 2 3  '$ Moving slowly or fidgety/restless 0 0 2  Suicidal thoughts 0 0 0  PHQ-9 Score '10 11 19  '$ Difficult doing work/chores Not difficult at all Somewhat difficult Somewhat difficult    Social History   Tobacco Use   Smoking status: Never    Passive exposure: Never   Smokeless tobacco: Never  Vaping Use   Vaping Use: Never used  Substance Use Topics   Alcohol use: Not Currently   Drug use: Never    Review of Systems Per HPI unless specifically indicated above     Objective:    Ht '5\' 6"'$  (1.676 m)   Wt 181 lb (82.1 kg)   LMP  (LMP Unknown) Comment: last cycle over a year ago  BMI 29.21 kg/m   Wt Readings from Last 3 Encounters:  02/05/22 181 lb (82.1 kg)  02/03/22 181 lb (82.1 kg)  01/01/22 186 lb (84.4 kg)    Physical Exam  Note examination was completely remotely via video observation objective data only  Gen - well-appearing, no acute distress or apparent pain, comfortable HEENT - Left eye conjunctival injection mild, no edema surrounding, mild discharge clear Heart/Lungs - cannot examine virtually - observed no evidence of coughing or labored breathing. Abd - cannot examine virtually  Skin - face visible today- no rash Neuro - awake, alert, oriented Psych - not anxious appearing   Results for orders placed or  performed during the hospital encounter of 56/43/32  Basic metabolic panel  Result Value Ref Range   Sodium 142 135 - 145 mmol/L   Potassium 3.5 3.5 - 5.1 mmol/L   Chloride 110 98 - 111 mmol/L   CO2 25 22 - 32 mmol/L   Glucose, Bld 137 (H) 70 - 99 mg/dL   BUN 13 6 - 20 mg/dL   Creatinine, Ser 0.77 0.44 - 1.00 mg/dL   Calcium 9.4 8.9 - 10.3 mg/dL   GFR, Estimated >60 >60 mL/min   Anion gap 7 5 - 15  CBC  Result Value Ref Range   WBC 7.1 4.0 - 10.5 K/uL   RBC 4.28 3.87 - 5.11 MIL/uL   Hemoglobin 12.4 12.0 - 15.0 g/dL   HCT 37.4 36.0 - 46.0 %   MCV 87.4 80.0 - 100.0 fL   MCH 29.0 26.0 - 34.0 pg   MCHC 33.2 30.0 - 36.0  g/dL   RDW 12.4 11.5 - 15.5 %   Platelets 283 150 - 400 K/uL   nRBC 0.0 0.0 - 0.2 %  Urinalysis, Routine w reflex microscopic  Result Value Ref Range   Color, Urine YELLOW (A) YELLOW   APPearance CLEAR (A) CLEAR   Specific Gravity, Urine 1.011 1.005 - 1.030   pH 6.0 5.0 - 8.0   Glucose, UA NEGATIVE NEGATIVE mg/dL   Hgb urine dipstick MODERATE (A) NEGATIVE   Bilirubin Urine NEGATIVE NEGATIVE   Ketones, ur NEGATIVE NEGATIVE mg/dL   Protein, ur NEGATIVE NEGATIVE mg/dL   Nitrite NEGATIVE NEGATIVE   Leukocytes,Ua NEGATIVE NEGATIVE   RBC / HPF 0-5 0 - 5 RBC/hpf   WBC, UA 0-5 0 - 5 WBC/hpf   Bacteria, UA NONE SEEN NONE SEEN   Squamous Epithelial / LPF 0-5 0 - 5   Mucus PRESENT   Brain natriuretic peptide  Result Value Ref Range   B Natriuretic Peptide 28.1 0.0 - 100.0 pg/mL  Troponin I (High Sensitivity)  Result Value Ref Range   Troponin I (High Sensitivity) 3 <18 ng/L      Assessment & Plan:   Problem List Items Addressed This Visit   None Visit Diagnoses     Acute bacterial conjunctivitis of left eye    -  Primary   Relevant Medications   erythromycin ophthalmic ointment       Acute L conjunctivitis for past 3 days, with mild scleral/conjunctival injection with clear discharge Suspected viral vs bacterial etiology some URI sinusitis Visualized on video today - No evidence of complication, foreign body, or extending eyelid / pre-septal cellulitis  Plan: 1. Reassurance, most likely self limited, empiric coverage with Start erythromycin opht ointment QID 7 days 2. Start moist warm compresses over Left eyelid 10-15 min at a time, up to 4-6 times daily until resolution 3. Frequent hand washing, avoid rubbing / scratching eye 4. Strict return precautions for spreading infection 5. Follow-up 1-2 weeks as needed  Future follow up Tick Bite if not resolved - signs of erythema migrans, or if 3+ weeks interested in lab for lyme ab  if new concerns or symptoms.  She is allergic  to Doxycycline, limited options. Already on Clindamcyin for sinuses   Meds ordered this encounter  Medications   erythromycin ophthalmic ointment    Sig: Place 1 Application into the left eye 4 (four) times daily. For 7 days    Dispense:  3.5 g    Refill:  0      Follow up plan: Return in  about 4 weeks (around 03/05/2022) for 4 weeks labs and annual physical.  Future labs ordered     Patient verbalizes understanding with the above medical recommendations including the limitation of remote medical advice.  Specific follow-up and call-back criteria were given for patient to follow-up or seek medical care more urgently if needed.  Total duration of direct patient care provided via video conference: 15 minutes    [name], [facility] 02/05/2022, 4:40 PM

## 2022-02-11 ENCOUNTER — Ambulatory Visit: Payer: Medicaid Other | Admitting: Urology

## 2022-03-11 ENCOUNTER — Encounter: Payer: Self-pay | Admitting: Family Medicine

## 2022-03-11 ENCOUNTER — Ambulatory Visit: Payer: Medicaid Other | Admitting: Family Medicine

## 2022-03-11 VITALS — BP 133/55 | HR 74 | Ht 66.0 in | Wt 184.6 lb

## 2022-03-11 DIAGNOSIS — N3001 Acute cystitis with hematuria: Secondary | ICD-10-CM | POA: Diagnosis not present

## 2022-03-11 DIAGNOSIS — R300 Dysuria: Secondary | ICD-10-CM | POA: Diagnosis not present

## 2022-03-11 LAB — POCT URINALYSIS DIPSTICK
Bilirubin, UA: NEGATIVE
Glucose, UA: NEGATIVE
Ketones, UA: NEGATIVE
Nitrite, UA: NEGATIVE
Protein, UA: POSITIVE — AB
Spec Grav, UA: 1.02 (ref 1.010–1.025)
Urobilinogen, UA: 0.2 E.U./dL
pH, UA: 5 (ref 5.0–8.0)

## 2022-03-11 MED ORDER — FOSFOMYCIN TROMETHAMINE 3 G PO PACK: 3.0000 g | PACK | Freq: Once | ORAL | 0 refills | Status: AC

## 2022-03-11 NOTE — Patient Instructions (Addendum)
Thank you for coming to the office today.  Please follow up with Dr Marcelline Mates to discuss next plan with visit and upcoming pap / [place] and further management.  You have a Urinary Tract Infection - this is very common, your symptoms are reassuring and you should get better within 1 week on the antibiotics Start Fosfomycin 1 x dose  - We sent urine for a culture, we will call you within next few days if we need to change antibiotics - Please drink plenty of fluids, improve hydration over next 1 week  If symptoms worsening, developing nausea / vomiting, worsening back pain, fevers / chills / sweats, then please return for re-evaluation sooner.  If you take AZO OTC - limit this to 2-3 days MAX to avoid affecting kidneys  D-Mannose is a natural supplement that can actually help bind to urinary bacteria and reduce their effectiveness it can help prevent UTI from forming, and may reduce some symptoms. It likely cannot cure an active UTI but it is worth a try and good to prevent them with. Try '500mg'$  twice a day at a full dose if you want, or check package instructions for more info   Please schedule a Follow-up Appointment to: Return if symptoms worsen or fail to improve.  If you have any other questions or concerns, please feel free to call the office or send a message through [place]. You may also schedule an earlier appointment if necessary.  Additionally, you may be receiving a survey about your experience at our office within a few days to 1 week by e-mail or mail. We value your feedback.  [name], DO [place]

## 2022-03-11 NOTE — Progress Notes (Signed)
Subjective:    Patient ID: Shirley [name], female    DOB: 01/11/1971, 51 y.o.   MRN: [id number]  Shirley [name] is a 51 y.o. female presenting on 03/11/2022 for Urinary Tract Infection  Patient presents for a same day appointment.  HPI  Recurrent UTI Reports symptoms started onset 3 days ago with dysuria frequency, history of interstitial cystitis. She has missed recent Urology apt, will re-schedule Not on recent antibiotic Fosfomycin has worked for her in the past Denies fever chills back or flank pain  Major stressor with Sister with pancreatic cancer 4 months to live. Mother also had pancreatic cancer passed       10/02/2021    2:19 PM 08/26/2021    3:56 PM 04/03/2021    1:56 PM  Depression screen PHQ 2/9  Decreased Interest 0 1 1  Down, Depressed, Hopeless '1 1 1  '$ PHQ - 2 Score '1 2 2  '$ Altered sleeping '1 3 3  '$ Tired, decreased energy '1 3 3  '$ Change in appetite 3 0 3  Feeling bad or failure about yourself  '1 1 3  '$ Trouble concentrating '3 2 3  '$ Moving slowly or fidgety/restless 0 0 2  Suicidal thoughts 0 0 0  PHQ-9 Score '10 11 19  '$ Difficult doing work/chores Not difficult at all Somewhat difficult Somewhat difficult    Social History   Tobacco Use   Smoking status: Never    Passive exposure: Never   Smokeless tobacco: Never  Vaping Use   Vaping Use: Never used  Substance Use Topics   Alcohol use: Not Currently   Drug use: Never    Review of Systems Per HPI unless specifically indicated above     Objective:    BP (!) 133/55   Pulse 74   Ht '5\' 6"'$  (1.676 m)   Wt 184 lb 9.6 oz (83.7 kg)   LMP  (LMP Unknown) Comment: last cycle over a year ago  SpO2 98%   BMI 29.80 kg/m   Wt Readings from Last 3 Encounters:  03/11/22 184 lb 9.6 oz (83.7 kg)  02/05/22 181 lb (82.1 kg)  02/03/22 181 lb (82.1 kg)    Physical Exam Vitals and nursing note reviewed.  Constitutional:      General: She is not in acute distress.    Appearance: Normal appearance. She is  well-developed. She is not diaphoretic.     Comments: Well-appearing, comfortable, cooperative  HENT:     Head: Normocephalic and atraumatic.  Eyes:     General:        Right eye: No discharge.        Left eye: No discharge.     Conjunctiva/sclera: Conjunctivae normal.  Cardiovascular:     Rate and Rhythm: Normal rate.  Pulmonary:     Effort: Pulmonary effort is normal.  Skin:    General: Skin is warm and dry.     Findings: No erythema or rash.  Neurological:     Mental Status: She is alert and oriented to person, place, and time.  Psychiatric:        Mood and Affect: Mood normal.        Behavior: Behavior normal.        Thought Content: Thought content normal.     Comments: Well groomed, good eye contact, normal speech and thoughts       Results for orders placed or performed in visit on 03/11/22  POCT Urinalysis Dipstick  Result Value Ref Range   Color,  UA Orange    Clarity, UA Cloudy    Glucose, UA Negative Negative   Bilirubin, UA Negative    Ketones, UA Negative    Spec Grav, UA 1.020 1.010 - 1.025   Blood, UA Moderate ++    pH, UA 5.0 5.0 - 8.0   Protein, UA Positive (A) Negative   Urobilinogen, UA 0.2 0.2 or 1.0 E.U./dL   Nitrite, UA Negative    Leukocytes, UA Trace (A) Negative   Appearance     Odor        Assessment & Plan:   Problem List Items Addressed This Visit     Dysuria - Primary   Relevant Medications   fosfomycin (MONUROL) 3 g PACK    Clinically consistent with UTI and confirmed on UA. No recent UTIs or abx courses last was 10/2021 Followed by Urology history of interstitial cystitis  Plan: 1. UA / micro - abnormal 2. Ordered Urine culture 3. Fosfomyci 3g 1 dose 4. Improve PO hydration 5. RTC if no improvement 1-2 weeks, red flags given to return sooner   Meds ordered this encounter  Medications   fosfomycin (MONUROL) 3 g PACK    Sig: Take 3 g by mouth once for 1 dose.    Dispense:  3 g    Refill:  0     Follow up  plan: Return if symptoms worsen or fail to improve.   [name], [facility] 03/11/2022, 11:40 AM

## 2022-03-12 ENCOUNTER — Encounter: Payer: Self-pay | Admitting: Obstetrics and Gynecology

## 2022-03-12 ENCOUNTER — Ambulatory Visit: Payer: Self-pay

## 2022-03-12 NOTE — Telephone Encounter (Signed)
I am not sure how to answer this question. The pharmacy can probably explain better than I can.  I ordered her 3g x 1 dose only. I do not know why she would be given 9g total or 3 doses.  It should only be 1 single dose for this med.  If she received extra she may save for future.  [name] CMA will message her on mychart with this information now  [name], [facility] 03/12/2022, 4:23 PM

## 2022-03-12 NOTE — Telephone Encounter (Signed)
  Chief Complaint: Shirley [name] if she should take additional doses Symptoms: UTI Frequency: ongoing Pertinent Negatives: Patient denies  Disposition: '[]'$ ED /'[]'$ Urgent Care (no appt availability in office) / '[]'$ Appointment(In office/virtual)/ '[]'$  [facility]/ '[]'$ Home Care/ '[]'$ Refused Recommended Disposition /'[]'$ [facility]/ '[x]'$  Follow-up with PCP Additional Notes:  Pt was seen in office yesterday and was prescribed Fosfomycin for infection> Prescription as seen below.  At the pharmacy pt was given 3 boxes of this medication for a total of 9 Grams.  Pt is unsure if she it to take all of this medicine that she was given.    PT was advised how rx was written and told not to take additional boxes unless Dr. Parks Ranger contacts her and tells her to do so.  PT states she will not take any additional medication. Please advise pt.            Meds ordered this encounter  Medications   fosfomycin (MONUROL) 3 g PACK      Sig: Take 3 g by mouth once for 1 dose.      Dispense:  3 g      Refill:  0        Follow up plan: Return if symptoms worsen or fail to improve.     [name], [facility] 03/11/2022, 11:40 AM       Summary: Fossomycin question?   Patient called in stating she saw the provider yesterday and he wrote her a prescription for Fossomycin. She normally gets 1 box but they gave her 3 boxes at the pharmacy. She has questions about how to take it because it says 3 grams by mouth for 1 dose. Please assist patient further as she wants to make sure she is supposed to just take the one box since she now has 3.      Reason for Disposition  [1] Caller has URGENT medicine question about med that PCP or specialist prescribed AND [2] triager unable to answer question  Answer Assessment - Initial Assessment Questions 1. NAME of MEDICINE: "What medicine(s) are you calling about?"     Fosfomycin 2. QUESTION:  "What is your question?" (e.g., double dose of medicine, side effect)     Pt was given 3  boxes of medication each box has 3 grams of medication.  3. PRESCRIBER: "Who prescribed the medicine?" Reason: if prescribed by specialist, call should be referred to that group.     Dr. Parks Ranger 4. SYMPTOMS: "Do you have any symptoms?" If Yes, ask: "What symptoms are you having?"  "How bad are the symptoms (e.g., mild, moderate, severe)     UTI 5. PREGNANCY:  "Is there any chance that you are pregnant?" "When was your last menstrual period?"     na  Protocols used: Medication Question Call-A-AH

## 2022-03-13 LAB — URINE CULTURE
MICRO NUMBER:: 13782634
SPECIMEN QUALITY:: ADEQUATE

## 2022-03-14 ENCOUNTER — Other Ambulatory Visit: Payer: Self-pay | Admitting: Obstetrics and Gynecology

## 2022-03-14 DIAGNOSIS — N83201 Unspecified ovarian cyst, right side: Secondary | ICD-10-CM

## 2022-03-14 DIAGNOSIS — R1030 Lower abdominal pain, unspecified: Secondary | ICD-10-CM

## 2022-03-14 DIAGNOSIS — R140 Abdominal distension (gaseous): Secondary | ICD-10-CM

## 2022-03-24 ENCOUNTER — Encounter: Payer: Self-pay | Admitting: Obstetrics and Gynecology

## 2022-04-01 ENCOUNTER — Ambulatory Visit: Payer: Self-pay

## 2022-04-01 ENCOUNTER — Ambulatory Visit
Admission: EM | Admit: 2022-04-01 | Discharge: 2022-04-01 | Disposition: A | Discharge status: Home / Self Care | Payer: Medicaid Other | Attending: Emergency Medicine | Admitting: Emergency Medicine

## 2022-04-01 DIAGNOSIS — T63421A Toxic effect of venom of ants, accidental (unintentional), initial encounter: Secondary | ICD-10-CM | POA: Diagnosis not present

## 2022-04-01 HISTORY — DX: Unspecified ovarian cyst, unspecified side: N83.209

## 2022-04-01 MED ORDER — TRIAMCINOLONE ACETONIDE 0.1 % EX CREA: 1.0000 | TOPICAL_CREAM | Freq: Two times a day (BID) | CUTANEOUS | 0 refills | Status: DC

## 2022-04-01 NOTE — Telephone Encounter (Signed)
For her symptoms, I would advise her to use topical Cortisone ointment, anti itch cream if need, and can use ibuprofen as needed for pain relief. Topical ice packs to help swelling as well.  She should pursue OTC regimen first and keep her apt on Friday 9/8 please  [name], [facility] 04/01/2022, 4:42 PM

## 2022-04-01 NOTE — Discharge Instructions (Signed)
Use Claritin, Zyrtec, or Allegra during the day as needed for itching.  Apply Triamcinolone ointment twice daily as needed for itching.  You can take 2 Benadryl at bedtime as needed for itching.  Return for new or worsening symptoms.

## 2022-04-01 NOTE — Telephone Encounter (Signed)
  Chief Complaint: right foot pain due to fire ant bites Symptoms: stepped into nest of fire ants and right foot swollen and toes. Red and blisters noted. Itching pain. Nausea  Frequency: yesterday  Pertinent Negatives: Patient denies chest pain no difficulty breathing . Has not checked temp. Disposition: '[x]'$ ED /'[]'$ Urgent Care (no appt availability in office) / '[]'$ Appointment(In office/virtual)/ '[]'$  [facility]/ '[]'$ Home Care/ '[]'$ Refused Recommended Disposition /'[]'$ [facility]/ '[]'$  Follow-up with PCP Additional Notes:   Recommended ED. Patient reports she has transportation issues driving that far. Can make it to practice with care but feels unsafe driving car due to breaking down. No appt available until 04/04/22 . Please advise  patient does not want to go to ED. Requesting a call back  Reason for Disposition  [1] Red or very tender (to touch) area AND [2] started over 24 hours after the sting  Answer Assessment - Initial Assessment Questions 1. SEVERITY: "How many stings are there?"     16 2. ONSET: "When did it occur?"      Yesterday  3. LOCATION: "Where is the sting located?"  "How many stings?"     Right foot 16 bites  4. SWELLING: "How big is the swelling?" (e.g., inches or cm)     Toes and foot swelling  5. REDNESS: "Is the area red or pink?" If Yes, ask: "What size is the area of redness?" (e.g., inches or cm). "When did the redness start?"     Red blisters  6. PAIN: "Is there any pain?" If Yes, ask: "How bad is it?"  (Scale 1-10; or mild, moderate, severe)     Pain with burning  7. ITCHING: "Is there any itching?" If Yes, ask: "How bad is it?"      Yes  8. RESPIRATORY DISTRESS: "Describe your breathing."     No  9. PRIOR REACTIONS: "Have you had any severe allergic reactions to stings in the past?" If Yes, ask: "What happened?"     Na  10. OTHER SYMPTOMS: "Do you have any other symptoms?" (e.g., abdomen pain, face or tongue swelling, new rash elsewhere,  vomiting)       Nausea unable to eat anxious , back pain sore throat  11. PREGNANCY: "Is there any chance you are pregnant?" "When was your last menstrual period?"       na  Protocols used: [employer]

## 2022-04-01 NOTE — ED Provider Notes (Signed)
[facility]    CSN: [id number] Arrival date & time: 04/01/22  1512      History   Chief Complaint Chief Complaint  Patient presents with   Insect Bite    HPI Shirley [name] is a 51 y.o. female.   HPI  51 year old female here for evaluation of insect bites.  Patient reports that she stepped in a nest of fire ants yesterday and received bites on both of her feet.  She states that she had some cramping in her toes earlier and she is complaining of redness.  She called her PCP who told her to come in to be evaluated.  Past Medical History:  Diagnosis Date   Abnormal Pap smear of cervix    ADHD (attention deficit hyperactivity disorder)    Anxiety    Frequent headaches    Hypertension    Mitral valve prolapse    Ovarian cyst    Spina bifida occulta    UTI (urinary tract infection)     Patient Active Problem List   Diagnosis Date Noted   Essential hypertension 01/01/2022   Generalized anxiety disorder with panic attacks 08/26/2021   Major depressive disorder, recurrent, moderate ([place]) 08/26/2021   Attention deficit hyperactivity disorder (ADHD), combined type 08/26/2021   PTSD (post-traumatic stress disorder) 08/26/2021   Mild persistent asthma without complication 18/56/3149   Dysuria 01/22/2021   Hyperlipidemia 12/31/2020   Renal stone 12/31/2020   Hidradenitis suppurativa of left axilla 12/31/2020   Interstitial cystitis (chronic) with hematuria 11/15/2020   Perimenopause 09/19/2016   Family history of ovarian cancer 09/19/2016   Family history of colon cancer 09/19/2016   Anxiety 09/19/2016   Cyst of right ovary 09/19/2016   Abnormal uterine bleeding (AUB) 09/19/2016   Status post LEEP (loop electrosurgical excision procedure) of cervix 09/19/2016    Past Surgical History:  Procedure Laterality Date   LEEP      OB History     Gravida  4   Para  2   Term  2   Preterm      AB  2   Living  2      SAB  2   IAB      Ectopic       Multiple      Live Births  2        Obstetric Comments  Reporst 6 pound first NSD and 8'10" forceps 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 Authorizing Provider  acyclovir (ZOVIRAX) 400 MG tablet Take 400 mg by mouth 3 (three) times daily. 08/09/21  Yes [provider]  albuterol (PROAIR HFA) 108 (90 Base) MCG/ACT inhaler Inhale 2 puffs into the lungs every 6 (six) hours as needed for wheezing or shortness of breath. 08/26/21  Yes Karamalegos, Devonne Doughty, DO  ALPRAZolam Duanne Moron) 1 MG tablet Take 1 mg by mouth 3 (three) times daily as needed for anxiety.   Yes [provider]  cetirizine (ZYRTEC) 10 MG tablet Take 1 tablet (10 mg total) by mouth daily. 01/07/21  Yes Montel Culver, MD  Cholecalciferol (VITAMIN D3) 125 MCG (5000 UT) CAPS Take by mouth.   Yes [provider]  fluticasone (FLONASE) 50 MCG/ACT nasal spray SPRAY 2 SPRAYS INTO EACH NOSTRIL EVERY DAY 01/29/21  Yes Montel Culver, MD  triamcinolone cream (KENALOG) 0.1 %  Apply 1 Application topically 2 (two) times daily. 04/01/22  Yes Margarette Canada, NP  VITAMIN D PO Take 1 capsule by mouth in the morning and at bedtime.   Yes [provider]    Family History Family History  Problem Relation Age of Onset   Ovarian cancer Mother    Pancreatic cancer Mother    Diabetes Mellitus II Mother    Stroke Father    Heart disease Father    Cancer Father    Diabetes Mellitus II Father    Coronary artery disease Father    Mental illness Father    Anxiety disorder Sister    Mitral valve prolapse Sister    Heart attack Sister    Drug abuse Daughter     Social History Social History   Tobacco Use   Smoking status: Never    Passive exposure: Never   Smokeless tobacco: Never  Vaping Use   Vaping Use: Never used  Substance Use Topics   Alcohol use: Not Currently    Drug use: Never     Allergies   Bactrim [sulfamethoxazole-trimethoprim], Gadolinium, Gadolinium derivatives, Iodinated contrast media, Iodine, Other, Sulfa antibiotics, Azithromycin, Clarithromycin, Furosemide, Guaifenesin, Macrobid [nitrofurantoin monohyd macro], Nitrofurantoin, Penicillins, Amoxicillin, Doxycycline, Keflex [cephalexin], and Levofloxacin   Review of Systems Review of Systems  Constitutional:  Negative for fever.  Respiratory:  Negative for shortness of breath.   Gastrointestinal:  Positive for nausea.  Skin:  Positive for color change and wound.  Hematological: Negative.   Psychiatric/Behavioral: Negative.       Physical Exam Triage Vital Signs ED Triage Vitals  Enc Vitals Group     BP 04/01/22 1610 (!) 142/92     Pulse Rate 04/01/22 1610 85     Resp 04/01/22 1610 18     Temp 04/01/22 1610 97.9 F (36.6 C)     Temp Source 04/01/22 1610 Oral     SpO2 04/01/22 1610 97 %     Weight 04/01/22 1606 180 lb (81.6 kg)     Height 04/01/22 1606 '5\' 6"'$  (1.676 m)     Head Circumference --      Peak Flow --      Pain Score 04/01/22 1606 10     Pain Loc --      Pain Edu? --      Excl. in [place]? --    No data found.  Updated Vital Signs BP (!) 142/92 (BP Location: Left Arm)   Pulse 85   Temp 97.9 F (36.6 C) (Oral)   Resp 18   Ht '5\' 6"'$  (1.676 m)   Wt 180 lb (81.6 kg)   LMP  (LMP Unknown) Comment: last cycle over a year ago  SpO2 97%   BMI 29.05 kg/m   Visual Acuity Right Eye Distance:   Left Eye Distance:   Bilateral Distance:    Right Eye Near:   Left Eye Near:    Bilateral Near:     Physical Exam Vitals and nursing note reviewed.  Constitutional:      Appearance: Normal appearance. She is not ill-appearing.  HENT:     Head: Normocephalic and atraumatic.  Skin:    General: Skin is warm.     Capillary Refill: Capillary refill takes less than 2 seconds.     Findings: Erythema and rash present.  Neurological:     General: No focal deficit  present.     Mental Status: She is alert and oriented to person, place, and time.  Psychiatric:  Mood and Affect: Mood normal.        Behavior: Behavior normal.        Thought Content: Thought content normal.        Judgment: Judgment normal.      UC Treatments / Results  Labs (all labs ordered are listed, but only abnormal results are displayed) Labs Reviewed - No data to display  EKG   Radiology No results found.  Procedures Procedures (including critical care time)  Medications Ordered in UC Medications - No data to display  Initial Impression / Assessment and Plan / UC Course  I have reviewed the triage vital signs and the nursing notes.  Pertinent labs & imaging results that were available during my care of the patient were reviewed by me and considered in my medical decision making (see chart for details).   Patient is a nontoxic-appearing 51 year old female here for evaluation for bites that she sustained to both feet yesterday.  She states that they are itching and that she had some cramping of her toes earlier.  She also endorses some mild nausea.  She came in here at the advice of her PCP per her report.  Patient vital signs are unremarkable.  She is mildly hypertensive but she states that she is currently out of her blood pressure medication.  She is afebrile with a temp of 97.9.      The patient's bites consist of 2 small erythematous pustules on the arch of her right foot, and erythematous maculopapular lesion on her anterior right ankle, and a small area of erythema on her left toe.  None of the areas of redness are warm to touch and are free of induration or fluctuance.  DP and PT pulses in both feet are 2+.  Patient exam is consistent with fire ant bites.  I will treat her with topical triamcinolone to help with itching twice daily, over-the-counter Claritin, Zyrtec, or Allegra during the day and over-the-counter Benadryl at night as needed for  itching.   Final Clinical Impressions(s) / UC Diagnoses   Final diagnoses:  Fire ant bite, accidental or unintentional, initial encounter     Discharge Instructions      Use Claritin, Zyrtec, or Allegra during the day as needed for itching.  Apply Triamcinolone ointment twice daily as needed for itching.  You can take 2 Benadryl at bedtime as needed for itching.  Return for new or worsening symptoms.      ED Prescriptions     Medication Sig Dispense Auth. Provider   triamcinolone cream (KENALOG) 0.1 % Apply 1 Application topically 2 (two) times daily. 30 g Margarette Canada, NP      PDMP not reviewed this encounter.   Margarette Canada, NP 04/01/22 [id number]

## 2022-04-01 NOTE — Telephone Encounter (Signed)
Patient called, left VM to return the call to the office to discuss symptoms with a nurse.     Summary: insect bites / rx req   The patient has received multiple insect bites on their foot   The patient shares that they can count roughly 16 bites on their foot   The patient shares that they're experiencing discomfort and irritation   The patient would like to be prescribed something for their discomfort   Please contact further when possible

## 2022-04-01 NOTE — ED Triage Notes (Signed)
Pt c/o fire ant bites along right foot. Pt is having nausea, headaches, and cramping.   Pt states that today her toes started to cramp, separate, and claw.   Pt states that she was bitten by 16 fireants.

## 2022-04-02 ENCOUNTER — Other Ambulatory Visit: Payer: Self-pay | Admitting: Family Medicine

## 2022-04-02 DIAGNOSIS — I10 Essential (primary) hypertension: Secondary | ICD-10-CM

## 2022-04-03 NOTE — Telephone Encounter (Signed)
Requested medication (s) are due for refill today: no  Requested medication (s) are on the active medication list: no  Last refill:  01/01/22  Future visit scheduled: no  Notes to clinic:  med was dc 'd by hospital provider Margarette Canada NP "Completed course"   Requested Prescriptions  Pending Prescriptions Disp Refills   valsartan (DIOVAN) 80 MG tablet [Pharmacy Med Name: VALSARTAN 80 MG TABLET] 90 tablet 1    Sig: TAKE 1 TABLET BY MOUTH EVERY DAY     Cardiovascular:  Angiotensin Receptor Blockers Failed - 04/02/2022  2:11 PM      Failed - Last BP in normal range    BP Readings from Last 1 Encounters:  04/01/22 (!) 142/92         Passed - Cr in normal range and within 180 days    Creatinine  Date Value Ref Range Status  04/18/2012 0.84 0.60 - 1.30 mg/dL Final   Creatinine, Ser  Date Value Ref Range Status  12/11/2021 0.77 0.44 - 1.00 mg/dL Final         Passed - K in normal range and within 180 days    Potassium  Date Value Ref Range Status  12/11/2021 3.5 3.5 - 5.1 mmol/L Final  04/18/2012 3.1 (L) 3.5 - 5.1 mmol/L Final         Passed - Patient is not pregnant      Passed - Valid encounter within last 6 months    Recent Outpatient Visits           3 weeks ago Acute cystitis with hematuria   [place], DO   1 month ago Acute bacterial conjunctivitis of left eye   [place], [place]   1 month ago Acute maxillary sinusitis, recurrence not specified   [place], [place]   3 months ago Essential hypertension   [place], DO   4 months ago Interstitial cystitis (chronic) with hematuria   [place], DO

## 2022-04-14 ENCOUNTER — Ambulatory Visit: Payer: Self-pay

## 2022-04-14 NOTE — Telephone Encounter (Signed)
2nd call, pt called, LVMTCB to discuss fall and knee pain. Will route to practice to follow up.

## 2022-04-14 NOTE — Telephone Encounter (Signed)
Pt called but disconnected prior to transfer to NT. Called pt back, LVMTCB to speak with nurse regarding fall and knee pain from today.

## 2022-04-15 NOTE — Telephone Encounter (Signed)
Called and left a message for her to call back to discuss. I will attempt to send her a secure my-chart message too.

## 2022-04-16 ENCOUNTER — Other Ambulatory Visit: Payer: Self-pay

## 2022-04-16 DIAGNOSIS — E785 Hyperlipidemia, unspecified: Secondary | ICD-10-CM

## 2022-04-16 DIAGNOSIS — Z114 Encounter for screening for human immunodeficiency virus [HIV]: Secondary | ICD-10-CM

## 2022-04-16 DIAGNOSIS — Z0000 Encounter for general adult medical examination without abnormal findings: Secondary | ICD-10-CM

## 2022-04-16 DIAGNOSIS — I10 Essential (primary) hypertension: Secondary | ICD-10-CM

## 2022-04-16 DIAGNOSIS — R7309 Other abnormal glucose: Secondary | ICD-10-CM

## 2022-04-17 ENCOUNTER — Other Ambulatory Visit: Payer: Medicaid Other

## 2022-04-17 ENCOUNTER — Other Ambulatory Visit: Payer: Self-pay

## 2022-04-17 DIAGNOSIS — Z114 Encounter for screening for human immunodeficiency virus [HIV]: Secondary | ICD-10-CM | POA: Diagnosis not present

## 2022-04-17 DIAGNOSIS — R7309 Other abnormal glucose: Secondary | ICD-10-CM | POA: Diagnosis not present

## 2022-04-17 DIAGNOSIS — Z0000 Encounter for general adult medical examination without abnormal findings: Secondary | ICD-10-CM | POA: Diagnosis not present

## 2022-04-17 DIAGNOSIS — Z1159 Encounter for screening for other viral diseases: Secondary | ICD-10-CM

## 2022-04-17 DIAGNOSIS — I10 Essential (primary) hypertension: Secondary | ICD-10-CM | POA: Diagnosis not present

## 2022-04-17 DIAGNOSIS — E785 Hyperlipidemia, unspecified: Secondary | ICD-10-CM | POA: Diagnosis not present

## 2022-04-18 LAB — COMPLETE METABOLIC PANEL WITH GFR
AG Ratio: 2.1 (calc) (ref 1.0–2.5)
ALT: 14 U/L (ref 6–29)
AST: 15 U/L (ref 10–35)
Albumin: 4.6 g/dL (ref 3.6–5.1)
Alkaline phosphatase (APISO): 85 U/L (ref 37–153)
BUN: 13 mg/dL (ref 7–25)
CO2: 28 mmol/L (ref 20–32)
Calcium: 9.4 mg/dL (ref 8.6–10.4)
Chloride: 106 mmol/L (ref 98–110)
Creat: 0.74 mg/dL (ref 0.50–1.03)
Globulin: 2.2 g/dL (calc) (ref 1.9–3.7)
Glucose, Bld: 82 mg/dL (ref 65–99)
Potassium: 4 mmol/L (ref 3.5–5.3)
Sodium: 141 mmol/L (ref 135–146)
Total Bilirubin: 0.4 mg/dL (ref 0.2–1.2)
Total Protein: 6.8 g/dL (ref 6.1–8.1)
eGFR: 97 mL/min/{1.73_m2} (ref >=60)

## 2022-04-18 LAB — CBC WITH DIFFERENTIAL/PLATELET
Absolute Monocytes: 432 cells/uL (ref 200–950)
Basophils Absolute: 38 cells/uL (ref 0–200)
Basophils Relative: 0.7 %
Eosinophils Absolute: 211 cells/uL (ref 15–500)
Eosinophils Relative: 3.9 %
HCT: 36.4 % (ref 35.0–45.0)
Hemoglobin: 12.3 g/dL (ref 11.7–15.5)
Lymphs Abs: 2079 cells/uL (ref 850–3900)
MCH: 28.9 pg (ref 27.0–33.0)
MCHC: 33.8 g/dL (ref 32.0–36.0)
MCV: 85.4 fL (ref 80.0–100.0)
MPV: 10.6 fL (ref 7.5–12.5)
Monocytes Relative: 8 %
Neutro Abs: 2641 cells/uL (ref 1500–7800)
Neutrophils Relative %: 48.9 %
Platelets: 251 10*3/uL (ref 140–400)
RBC: 4.26 10*6/uL (ref 3.80–5.10)
RDW: 13.1 % (ref 11.0–15.0)
Total Lymphocyte: 38.5 %
WBC: 5.4 10*3/uL (ref 3.8–10.8)

## 2022-04-18 LAB — LIPID PANEL
Cholesterol: 171 mg/dL (ref <200)
HDL: 56 mg/dL (ref >=50)
LDL Cholesterol (Calc): 90 mg/dL (calc)
Non-HDL Cholesterol (Calc): 115 mg/dL (calc) (ref <130)
Total CHOL/HDL Ratio: 3.1 (calc) (ref <5.0)
Triglycerides: 145 mg/dL (ref <150)

## 2022-04-18 LAB — HEMOGLOBIN A1C
Hgb A1c MFr Bld: 5.5 % of total Hgb (ref <5.7)
Mean Plasma Glucose: 111 mg/dL
eAG (mmol/L): 6.2 mmol/L

## 2022-04-18 LAB — HIV ANTIBODY (ROUTINE TESTING W REFLEX): HIV 1&2 Ab, 4th Generation: NONREACTIVE

## 2022-04-18 LAB — HEPATITIS C ANTIBODY: Hepatitis C Ab: NONREACTIVE

## 2022-04-21 ENCOUNTER — Ambulatory Visit: Payer: Self-pay | Admitting: *Deleted

## 2022-04-21 NOTE — Telephone Encounter (Signed)
Summary: medication reaction   Pt states she took fosfomycin at 7am for a possible uti and now is having diarrhea, dizziness, and back pain   Pt states medication was prescribed on 03-19-2022   Please fu w/ pt      Chief Complaint: diarrhea and dizziness after taking one dose Fosfomycin Symptoms: 5 watery stools Frequency: this morning  Pertinent Negatives: Patient denies fever Disposition: '[]'$ ED /'[]'$ Urgent Care (no appt availability in office) / '[x]'$ Appointment(In office/virtual)/ '[]'$  [facility]/ '[]'$ Home Care/ '[]'$ Refused Recommended Disposition /'[]'$ [facility]/ '[]'$  Follow-up with PCP Additional Notes: appt made for tomorrow. Pt wanting to know today if should take probiotic. Pt also cautioned if diarrhea returns and she deteriorates not to wait for appt, seek help UC/ED.   Reason for Disposition  MODERATE diarrhea (e.g., 4-6 times / day more than normal)  Answer Assessment - Initial Assessment Questions 1. ANTIBIOTIC: "What antibiotic are you taking?" "How many times per day?"     Fosfomycin 2. ANTIBIOTIC ONSET: "When was the antibiotic started?"     7am this morning 3. DIARRHEA SEVERITY: "How bad is the diarrhea?" "How many more stools have you had in the past 24 hours than normal?"    - NO DIARRHEA (SCALE 0)   - MILD (SCALE 1-3): Few loose or mushy BMs; increase of 1-3 stools over normal daily number of stools; mild increase in ostomy output.   -  MODERATE (SCALE 4-7): Increase of 4-6 stools daily over normal; moderate increase in ostomy output. * SEVERE (SCALE 8-10; OR 'WORST POSSIBLE'): Increase of 7 or more stools daily over normal; moderate increase in ostomy output; incontinence.     5 watery diarrhea afterwards 4. ONSET: "When did the diarrhea begin?"      Right after taking Fosfomycin 5. BM CONSISTENCY: "How loose or watery is the diarrhea?"      water 6. VOMITING: "Are you also vomiting?" If Yes, ask: "How many times in the past 24 hours?"      no 7.  ABDOMEN PAIN: "Are you having any abdomen pain?" If Yes, ask: "What does it feel like?" (e.g., crampy, dull, intermittent, constant)      Not today 8. ABDOMEN PAIN SEVERITY: If present, ask: "How bad is the pain?"  (e.g., Scale 1-10; mild, moderate, or severe)   - MILD (1-3): doesn't interfere with normal activities, abdomen soft and not tender to touch    - MODERATE (4-7): interferes with normal activities or awakens from sleep, abdomen tender to touch    - SEVERE (8-10): excruciating pain, doubled over, unable to do any normal activities       no 9. ORAL INTAKE: If vomiting, "Have you been able to drink liquids?" "How much liquids have you had in the past 24 hours?"     Yes, drinking gatorade and Pedialyite 10. HYDRATION: "Any signs of dehydration?" (e.g., dry mouth [not just dry lips], too weak to stand, dizziness, new weight loss) "When did you last urinate?"       no 11. EXPOSURE: "Have you traveled to a foreign country recently?" "Have you been exposed to anyone with diarrhea?" "Could you have eaten any food that was spoiled?"       na 12. OTHER SYMPTOMS: "Do you have any other symptoms?" (e.g., fever, blood in stool)       Urinary symptoms 13. PREGNANCY: "Is there any chance you are pregnant?" "When was your last menstrual period?"       no  Protocols used: Diarrhea  on Antibiotics-A-AH

## 2022-04-21 NOTE — Telephone Encounter (Signed)
Please contact patient Tues 9/26 AM to review with her before her apt.  I am not precisely sure if she needs to keep this apt.  If she needs the apt for the UTI, repeat urine testing and evaluation of that problem, that is fine.  But since she already treated with the Fosfomycin there may not be much else to do.  Diarrhea is a very common side effect of fosfomycin, documented at >10% of patients who take it.  I don't believe she would need apt for this side effect. It should resolve. Yes Probiotic is a good idea to help slow down diarrhea.  [name], [facility] 04/21/2022, 5:26 PM

## 2022-04-22 ENCOUNTER — Ambulatory Visit: Payer: Self-pay

## 2022-04-22 ENCOUNTER — Encounter: Payer: Self-pay | Admitting: Family Medicine

## 2022-04-22 ENCOUNTER — Ambulatory Visit: Payer: Medicaid Other | Admitting: Family Medicine

## 2022-04-22 VITALS — BP 112/50 | HR 68 | Temp 96.9°F | Ht 66.0 in | Wt 180.0 lb

## 2022-04-22 DIAGNOSIS — N3001 Acute cystitis with hematuria: Secondary | ICD-10-CM | POA: Diagnosis not present

## 2022-04-22 MED ORDER — FOSFOMYCIN TROMETHAMINE 3 G PO PACK: 3.0000 g | PACK | Freq: Once | ORAL | 0 refills | Status: AC

## 2022-04-22 NOTE — Telephone Encounter (Signed)
Patient presented for in office visit  Shirley [name], [facility] 04/22/2022, 3:18 PM

## 2022-04-22 NOTE — Telephone Encounter (Signed)
Patient scheduled and seen today for apt  Shirley [name], [facility] 04/22/2022, 3:17 PM

## 2022-04-22 NOTE — Telephone Encounter (Signed)
  Chief Complaint: hives anon legs and swelling, Symptoms: skin burning and tingling, diarrhea Frequency: day after starting inhaler Pertinent Negatives: Patient denies did not want to be triaged. Disposition: '[]'$ ED /'[]'$ Urgent Care (no appt availability in office) / '[]'$ Appointment(In office/virtual)/ '[]'$  [facility]/ '[]'$ Home Care/ '[x]'$ Refused Recommended Disposition /'[]'$ [facility]/ '[]'$  Follow-up with PCP Additional Notes:pt refused appt with PCP today. Pt stated that she spoke to a nurse who advised that she would cancel because PCP wont be able to make it better. Pt frustrated and upset that she canceled appt because of what a nurse advised. Pt refused ED visit- advised to go to urgent care appt. Pt stated she did not want to be triaged. Sent pt link to schedule Virtual UC Visit. [email or web address] Answer Assessment - Initial Assessment Questions 1. APPEARANCE of RASH: "Describe the rash." (e.g., spots, blisters, raised areas, skin peeling, scaly)     Refused triage 2. SIZE: "How big are the spots?" (e.g., tip of pen, eraser, coin; inches, centimeters)     Refused triage 3. LOCATION: "Where is the rash located?"     legs 4. COLOR: "What color is the rash?" (Note: It is difficult to assess rash color in people with darker-colored skin. When this situation occurs, simply ask the caller to describe what they see.)     Looks like hives 5. ONSET: "When did the rash begin?"     Day after starting Breztri 6. FEVER: "Do you have a fever?" If Yes, ask: "What is your temperature, how was it measured, and when did it start?"     Refused triage 7. ITCHING: "Does the rash itch?" If Yes, ask: "How bad is the itch?" (Scale 1-10; or mild, moderate, severe)     Refused triage 8. CAUSE: "What do you think is causing the rash?"     Inhaler Breztri 9. MEDICINE FACTORS: "Have you started any new medicines within the last 2 weeks?" (e.g., antibiotics)      no 10.  OTHER SYMPTOMS: "Do you have any other symptoms?" (e.g., dizziness, headache, sore throat, joint pain)       Dizziness, ringing ears,  11. PREGNANCY: "Is there any chance you are pregnant?" "When was your last menstrual period?"       N/a  Protocols used: Rash or Redness - [facility]

## 2022-04-22 NOTE — Progress Notes (Unsigned)
Subjective:    Patient ID: Shirley [name], female    DOB: [birth year], 51 y.o.   MRN: [id number]  Shirley [name] is a 51 y.o. female presenting on 04/22/2022 for Diarrhea and Urticaria   HPI  UTI Darker urine She took 1 dose at sign of UTI Fosfomycin reaction with diarrhea, throat issues and hives urticaria, flu like Urinary symptoms now improved Admits associated headache  She said now she has poor sleep and took the fosfomycin on empty stomach. Did not take probiotic.  Sister cancer Septic tank issues Significant stressors with her son, going to mental health / therapist. His father has history of bipolar, and parents on that side both killed themselves.      10/02/2021    2:19 PM 08/26/2021    3:56 PM 04/03/2021    1:56 PM  Depression screen PHQ 2/9  Decreased Interest 0 1 1  Down, Depressed, Hopeless '1 1 1  ' PHQ - 2 Score '1 2 2  ' Altered sleeping '1 3 3  ' Tired, decreased energy '1 3 3  ' Change in appetite 3 0 3  Feeling bad or failure about yourself  '1 1 3  ' Trouble concentrating '3 2 3  ' Moving slowly or fidgety/restless 0 0 2  Suicidal thoughts 0 0 0  PHQ-9 Score '10 11 19  ' Difficult doing work/chores Not difficult at all Somewhat difficult Somewhat difficult    Social History   Tobacco Use   Smoking status: Never    Passive exposure: Never   Smokeless tobacco: Never  Vaping Use   Vaping Use: Never used  Substance Use Topics   Alcohol use: Not Currently   Drug use: Never    Review of Systems Per HPI unless specifically indicated above     Objective:    BP (!) 112/50 (BP Location: Right Arm, Patient Position: Sitting, Cuff Size: Normal)   Pulse 68   Temp (!) 96.9 F (36.1 C) (Temporal)   Ht '5\' 6"'  (1.676 m)   Wt 180 lb (81.6 kg)   LMP  (LMP Unknown) Comment: last cycle over a year ago  SpO2 99%   BMI 29.05 kg/m   Wt Readings from Last 3 Encounters:  04/22/22 180 lb (81.6 kg)  04/01/22 180 lb (81.6 kg)  03/11/22 184 lb 9.6 oz (83.7 kg)    Physical  Exam Vitals and nursing note reviewed.  Constitutional:      General: She is not in acute distress.    Appearance: Normal appearance. She is well-developed. She is not diaphoretic.     Comments: Well-appearing, comfortable, cooperative  HENT:     Head: Normocephalic and atraumatic.  Eyes:     General:        Right eye: No discharge.        Left eye: No discharge.     Conjunctiva/sclera: Conjunctivae normal.  Cardiovascular:     Rate and Rhythm: Normal rate.  Pulmonary:     Effort: Pulmonary effort is normal.  Skin:    General: Skin is warm and dry.     Findings: No erythema or rash.  Neurological:     Mental Status: She is alert and oriented to person, place, and time.  Psychiatric:        Mood and Affect: Mood normal.        Behavior: Behavior normal.        Thought Content: Thought content normal.     Comments: Well groomed, good eye contact, normal speech and thoughts  Results for orders placed or performed in visit on 04/16/22  Hemoglobin A1c  Result Value Ref Range   Hgb A1c MFr Bld 5.5 <5.7 % of total Hgb   Mean Plasma Glucose 111 mg/dL   eAG (mmol/L) 6.2 mmol/L  Lipid panel  Result Value Ref Range   Cholesterol 171 <200 mg/dL   HDL 56 > OR = 50 mg/dL   Triglycerides 145 <150 mg/dL   LDL Cholesterol (Calc) 90 mg/dL (calc)   Total CHOL/HDL Ratio 3.1 <5.0 (calc)   Non-HDL Cholesterol (Calc) 115 <130 mg/dL (calc)  CBC with Differential/Platelet  Result Value Ref Range   WBC 5.4 3.8 - 10.8 Thousand/uL   RBC 4.26 3.80 - 5.10 Million/uL   Hemoglobin 12.3 11.7 - 15.5 g/dL   HCT 36.4 35.0 - 45.0 %   MCV 85.4 80.0 - 100.0 fL   MCH 28.9 27.0 - 33.0 pg   MCHC 33.8 32.0 - 36.0 g/dL   RDW 13.1 11.0 - 15.0 %   Platelets 251 140 - 400 Thousand/uL   MPV 10.6 7.5 - 12.5 fL   Neutro Abs 2,641 1,500 - 7,800 cells/uL   Lymphs Abs 2,079 850 - 3,900 cells/uL   Absolute Monocytes 432 200 - 950 cells/uL   Eosinophils Absolute 211 15 - 500 cells/uL   Basophils Absolute  38 0 - 200 cells/uL   Neutrophils Relative % 48.9 %   Total Lymphocyte 38.5 %   Monocytes Relative 8.0 %   Eosinophils Relative 3.9 %   Basophils Relative 0.7 %  COMPLETE METABOLIC PANEL WITH GFR  Result Value Ref Range   Glucose, Bld 82 65 - 99 mg/dL   BUN 13 7 - 25 mg/dL   Creat 0.74 0.50 - 1.03 mg/dL   eGFR 97 > OR = 60 mL/min/1.75m   BUN/Creatinine Ratio SEE NOTE: 6 - 22 (calc)   Sodium 141 135 - 146 mmol/L   Potassium 4.0 3.5 - 5.3 mmol/L   Chloride 106 98 - 110 mmol/L   CO2 28 20 - 32 mmol/L   Calcium 9.4 8.6 - 10.4 mg/dL   Total Protein 6.8 6.1 - 8.1 g/dL   Albumin 4.6 3.6 - 5.1 g/dL   Globulin 2.2 1.9 - 3.7 g/dL (calc)   AG Ratio 2.1 1.0 - 2.5 (calc)   Total Bilirubin 0.4 0.2 - 1.2 mg/dL   Alkaline phosphatase (APISO) 85 37 - 153 U/L   AST 15 10 - 35 U/L   ALT 14 6 - 29 U/L  HIV Antibody (routine testing w rflx)  Result Value Ref Range   HIV 1&2 Ab, 4th Generation NON-REACTIVE NON-REACTIVE  Hepatitis C antibody  Result Value Ref Range   Hepatitis C Ab NON-REACTIVE NON-REACTIVE      Assessment & Plan:   Problem List Items Addressed This Visit   None   No orders of the defined types were placed in this encounter.     Follow up plan: No follow-ups on file.  ***Future labs ordered for ***  A total of *** (lvl 3, 4, 5) 15, 25, 40 minutes was spent face-to-face with this patient. Greater than 50% (approximately *** minutes) of this time was spent in counseling and coordination of care with the patient. ***  ANobie Putnam DO [facility] 04/22/2022, 3:12 PM

## 2022-04-22 NOTE — Telephone Encounter (Signed)
Buford her that she may not need the appt today per Dr. Raliegh Ip. I will let her scheduled for now but PEC please advise if she calls back and cancel it if she is okay doing so.

## 2022-04-22 NOTE — Patient Instructions (Addendum)
Thank you for coming to the office today.  Fosfomycin as needed if UTI Use probiotic, take with food ONly take if worsening UTI For now, seems like UTI has resolved  Please schedule a Follow-up Appointment to: Return if symptoms worsen or fail to improve.  If you have any other questions or concerns, please feel free to call the office or send a message through [place]. You may also schedule an earlier appointment if necessary.  Additionally, you may be receiving a survey about your experience at our office within a few days to 1 week by e-mail or mail. We value your feedback.  [name], DO [place]

## 2022-04-24 DIAGNOSIS — Z79899 Other long term (current) drug therapy: Secondary | ICD-10-CM | POA: Diagnosis not present

## 2022-04-24 DIAGNOSIS — F900 Attention-deficit hyperactivity disorder, predominantly inattentive type: Secondary | ICD-10-CM | POA: Diagnosis not present

## 2022-04-24 DIAGNOSIS — F410 Panic disorder [episodic paroxysmal anxiety] without agoraphobia: Secondary | ICD-10-CM | POA: Diagnosis not present

## 2022-05-02 ENCOUNTER — Encounter: Payer: Self-pay | Admitting: Emergency Medicine

## 2022-05-02 ENCOUNTER — Ambulatory Visit
Admission: EM | Admit: 2022-05-02 | Discharge: 2022-05-02 | Disposition: A | Discharge status: Home / Self Care | Payer: Medicaid Other

## 2022-05-02 DIAGNOSIS — S30860A Insect bite (nonvenomous) of lower back and pelvis, initial encounter: Secondary | ICD-10-CM

## 2022-05-02 DIAGNOSIS — W57XXXA Bitten or stung by nonvenomous insect and other nonvenomous arthropods, initial encounter: Secondary | ICD-10-CM

## 2022-05-02 NOTE — ED Provider Notes (Signed)
[facility]    CSN: [id number] Arrival date & time: 05/02/22  1741      History   Chief Complaint Chief Complaint  Patient presents with   Insect Bite    HPI Shirley [name] is a 51 y.o. female.   HPI  51 year old female here for evaluation of possible tick bite.  Patient reports that she was bitten on the right side of her low back 4 days ago and did not pull a tick off but she did have a tick fall out of her hairline.  She states that since that time the area has both itched and burned.  She endorses chills, pain in her knees, headache, diarrhea, and nausea.  She denies any drainage or fever.  She is concerned that she may have tick fever.  She did take a home COVID test which was negative.  Past Medical History:  Diagnosis Date   Abnormal Pap smear of cervix    ADHD (attention deficit hyperactivity disorder)    Anxiety    Frequent headaches    Hypertension    Mitral valve prolapse    Ovarian cyst    Spina bifida occulta    UTI (urinary tract infection)     Patient Active Problem List   Diagnosis Date Noted   Essential hypertension 01/01/2022   Generalized anxiety disorder with panic attacks 08/26/2021   Major depressive disorder, recurrent, moderate ([place]) 08/26/2021   Attention deficit hyperactivity disorder (ADHD), combined type 08/26/2021   PTSD (post-traumatic stress disorder) 08/26/2021   Mild persistent asthma without complication 81/82/9937   Dysuria 01/22/2021   Hyperlipidemia 12/31/2020   Renal stone 12/31/2020   Hidradenitis suppurativa of left axilla 12/31/2020   Interstitial cystitis (chronic) with hematuria 11/15/2020   Perimenopause 09/19/2016   Family history of ovarian cancer 09/19/2016   Family history of colon cancer 09/19/2016   Anxiety 09/19/2016   Cyst of right ovary 09/19/2016   Abnormal uterine bleeding (AUB) 09/19/2016   Status post LEEP (loop electrosurgical excision procedure) of cervix 09/19/2016    Past Surgical  History:  Procedure Laterality Date   LEEP      OB History     Gravida  4   Para  2   Term  2   Preterm      AB  2   Living  2      SAB  2   IAB      Ectopic      Multiple      Live Births  2        Obstetric Comments  Reporst 6 pound first NSD and 8'10" forceps 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 Authorizing Provider  ALPRAZolam Duanne Moron) 1 MG tablet Take 1 mg by mouth 3 (three) times daily as needed for anxiety.   Yes [provider]  valsartan (DIOVAN) 80 MG tablet TAKE 1 TABLET BY MOUTH EVERY DAY 04/03/22  Yes Karamalegos, Devonne Doughty, DO  acyclovir (ZOVIRAX) 400 MG tablet Take 400 mg by mouth 3 (three) times daily. 08/09/21   [provider]  albuterol (PROAIR HFA) 108 (90 Base) MCG/ACT inhaler Inhale 2 puffs into the lungs every 6 (six) hours as needed for wheezing or shortness of breath. 08/26/21   Karamalegos, Devonne Doughty, DO  cetirizine (ZYRTEC) 10 MG tablet Take 1 tablet (10  mg total) by mouth daily. 01/07/21   Montel Culver, MD  Cholecalciferol (VITAMIN D3) 125 MCG (5000 UT) CAPS Take by mouth.    [provider]  fluticasone (FLONASE) 50 MCG/ACT nasal spray SPRAY 2 SPRAYS INTO EACH NOSTRIL EVERY DAY 01/29/21   Montel Culver, MD  triamcinolone cream (KENALOG) 0.1 % Apply 1 Application topically 2 (two) times daily. 04/01/22   Margarette Canada, NP  VITAMIN D PO Take 1 capsule by mouth in the morning and at bedtime.    [provider]    Family History Family History  Problem Relation Age of Onset   Ovarian cancer Mother    Pancreatic cancer Mother    Diabetes Mellitus II Mother    Stroke Father    Heart disease Father    Cancer Father    Diabetes Mellitus II Father    Coronary artery disease Father    Mental illness Father    Anxiety disorder Sister    Mitral valve prolapse Sister     Heart attack Sister    Drug abuse Daughter     Social History Social History   Tobacco Use   Smoking status: Never    Passive exposure: Never   Smokeless tobacco: Never  Vaping Use   Vaping Use: Never used  Substance Use Topics   Alcohol use: Not Currently   Drug use: Never     Allergies   Bactrim [sulfamethoxazole-trimethoprim], Gadolinium, Gadolinium derivatives, Iodinated contrast media, Iodine, Other, Sulfa antibiotics, Azithromycin, Clarithromycin, Furosemide, Guaifenesin, Macrobid [nitrofurantoin monohyd macro], Nitrofurantoin, Penicillins, Amoxicillin, Doxycycline, Keflex [cephalexin], and Levofloxacin   Review of Systems Review of Systems  Constitutional:  Positive for chills. Negative for fever.  Gastrointestinal:  Positive for diarrhea and nausea.  Musculoskeletal:  Positive for arthralgias.  Skin:  Positive for color change and wound.  Neurological:  Positive for headaches.     Physical Exam Triage Vital Signs ED Triage Vitals  Enc Vitals Group     BP 05/02/22 1756 123/78     Pulse Rate 05/02/22 1756 88     Resp 05/02/22 1756 14     Temp 05/02/22 1756 98.1 F (36.7 C)     Temp Source 05/02/22 1756 Temporal     SpO2 05/02/22 1756 96 %     Weight 05/02/22 1753 180 lb (81.6 kg)     Height 05/02/22 1753 '5\' 6"'$  (1.676 m)     Head Circumference --      Peak Flow --      Pain Score 05/02/22 1752 3     Pain Loc --      Pain Edu? --      Excl. in [place]? --    No data found.  Updated Vital Signs BP 123/78 (BP Location: Right Arm)   Pulse 88   Temp 98.1 F (36.7 C) (Temporal) Comment: Patient requested her temperature be taken with temporal thermometer  Resp 14   Ht '5\' 6"'$  (1.676 m)   Wt 180 lb (81.6 kg)   LMP  (LMP Unknown) Comment: last cycle over a year ago  SpO2 96%   BMI 29.05 kg/m   Visual Acuity Right Eye Distance:   Left Eye Distance:   Bilateral Distance:    Right Eye Near:   Left Eye Near:    Bilateral Near:     Physical  Exam Vitals and nursing note reviewed.  Constitutional:      Appearance: Normal appearance.  HENT:     Head: Normocephalic and atraumatic.  Skin:    General: Skin is warm and dry.     Capillary Refill: Capillary refill takes less than 2 seconds.     Findings: Erythema and lesion present.  Neurological:     General: No focal deficit present.     Mental Status: She is alert and oriented to person, place, and time.  Psychiatric:        Mood and Affect: Mood normal.        Behavior: Behavior normal.        Thought Content: Thought content normal.        Judgment: Judgment normal.      UC Treatments / Results  Labs (all labs ordered are listed, but only abnormal results are displayed) Labs Reviewed - No data to display  EKG   Radiology No results found.  Procedures Procedures (including critical care time)  Medications Ordered in UC Medications - No data to display  Initial Impression / Assessment and Plan / UC Course  I have reviewed the triage vital signs and the nursing notes.  Pertinent labs & imaging results that were available during my care of the patient were reviewed by me and considered in my medical decision making (see chart for details).   Patient is a nontoxic-appearing 51 year old female here requesting evaluation of possible tick bite on her right low back that has been there for the past 4 days.  The area in question is a small, nickel sized area of erythema that is edematous but it is not hot.  There is a dimpling effect in the center which may represent a site of envenomation but it is unclear if this is a tick bite.  There is no erythema migrans present.  There is no drainage from the wound.  Patient states that she is concerned about tickborne illness however she is allergic to doxycycline.  Epic states that the patient suffers a rash from doxycycline but she indicates that she has closing of her throat with doxycycline.  I advised the patient that there  is no other antibiotic for prophylaxis for tickborne illness.  There are secondary treatments for Lyme disease but they include cefuroxime and azithromycin and she is allergic to both of those classes of medications.  I advised her to keep an eye on the area and return if she has any increased redness, swelling, drainage from the wound, heat, or measured fever over 100.5.  She could also have blood drawn by her primary care provider should her symptoms continue to look for the presence of tickborne illness and they could discuss treatment options at that time.  I have advised the patient to keep the area clean and dry.  She can use topical Voltaren gel 4 times a day as needed for pain and inflammation.   Final Clinical Impressions(s) / UC Diagnoses   Final diagnoses:  Insect bite of lower back, initial encounter     Discharge Instructions      Keep the area clean and dry.  Apply topical Voltaren gel 4 times a day as needed for pain.  If you develop increased redness, swelling, fever, or drainage follow-up with your PCP.   ED Prescriptions   None    PDMP not reviewed this encounter.   Margarette Canada, NP 05/02/22 1816

## 2022-05-02 NOTE — ED Triage Notes (Signed)
Patient states that she has an insect bite to her right lower back since Monday.  Patient reports headache, diarrhea and nausea.  Patient denies fevers.

## 2022-05-02 NOTE — Discharge Instructions (Signed)
Keep the area clean and dry.  Apply topical Voltaren gel 4 times a day as needed for pain.  If you develop increased redness, swelling, fever, or drainage follow-up with your PCP.

## 2022-06-03 ENCOUNTER — Telehealth: Payer: Self-pay | Admitting: Obstetrics and Gynecology

## 2022-06-03 NOTE — Telephone Encounter (Signed)
Dr Marcelline Mates order ultrasound in Aug 2023. [facility] attempt to reach patient x2 to scheduled with no calls back. I contacted patient today and left message to see if patient would like to scheduled or cancelled ultrasound

## 2022-06-10 ENCOUNTER — Ambulatory Visit: Payer: Medicaid Other | Admitting: Family Medicine

## 2022-06-10 ENCOUNTER — Encounter: Payer: Self-pay | Admitting: Family Medicine

## 2022-06-10 VITALS — BP 138/85 | HR 68 | Ht 66.0 in | Wt 179.2 lb

## 2022-06-10 DIAGNOSIS — J0110 Acute frontal sinusitis, unspecified: Secondary | ICD-10-CM

## 2022-06-10 DIAGNOSIS — K047 Periapical abscess without sinus: Secondary | ICD-10-CM | POA: Diagnosis not present

## 2022-06-10 MED ORDER — CLINDAMYCIN HCL 300 MG PO CAPS: 300.0000 mg | ORAL_CAPSULE | Freq: Three times a day (TID) | ORAL | 0 refills | Status: DC

## 2022-06-10 NOTE — Progress Notes (Signed)
Subjective:    Patient ID: Shirley [name], female    DOB: [birth year], 51 y.o.   MRN: [id number]  Shirley [name] is a 51 y.o. female presenting on 06/10/2022 for dental infection  Patient presents for a same day appointment.  HPI  Sinusitis, acute Dental Infection Reports issue with dental infection recently worsening with dental decay, has seen dentist they plan to do tooth treatment but cannot get in yet. In past treated with antibiotics clindamycin has allergy to others. She also endorses sinusitis with bad odor breath and sinus pressure drainage congestion.      10/02/2021    2:19 PM 08/26/2021    3:56 PM 04/03/2021    1:56 PM  Depression screen PHQ 2/9  Decreased Interest 0 1 1  Down, Depressed, Hopeless _0 PHQ - 2 Score _1 Altered sleeping _2 Tired, decreased energy _3 Change in appetite 3 0 3  Feeling bad or failure about yourself  _4 Trouble concentrating _5 Moving slowly or fidgety/restless 0 0 2  Suicidal thoughts 0 0 0  PHQ-9 Score _6 Difficult doing work/chores Not difficult at all Somewhat difficult Somewhat difficult    Social History   Tobacco Use   Smoking status: Never    Passive exposure: Never   Smokeless tobacco: Never  Vaping Use   Vaping Use: Never used  Substance Use Topics   Alcohol use: Not Currently   Drug use: Never    Review of Systems Per HPI unless specifically indicated above     Objective:    BP 138/85   Pulse 68   Ht _7  (1.676 m)   Wt 179 lb 3.2 oz (81.3 kg)   LMP  (LMP Unknown) Comment: last cycle over a year ago  SpO2 100%   BMI 28.92 kg/m   Wt Readings from Last 3 Encounters:  06/10/22 179 lb 3.2 oz (81.3 kg)  05/02/22 180 lb (81.6 kg)  04/22/22 180 lb (81.6 kg)    Physical Exam Vitals and nursing note reviewed.  Constitutional:      General: She is not in acute distress.    Appearance: Normal appearance. She is well-developed. She is not diaphoretic.     Comments: Well-appearing,  comfortable, cooperative  HENT:     Head: Normocephalic and atraumatic.  Eyes:     General:        Right eye: No discharge.        Left eye: No discharge.     Conjunctiva/sclera: Conjunctivae normal.  Cardiovascular:     Rate and Rhythm: Normal rate.  Pulmonary:     Effort: Pulmonary effort is normal.  Skin:    General: Skin is warm and dry.     Findings: No erythema or rash.  Neurological:     Mental Status: She is alert and oriented to person, place, and time.  Psychiatric:        Mood and Affect: Mood normal.        Behavior: Behavior normal.        Thought Content: Thought content normal.     Comments: Well groomed, good eye contact, normal speech and thoughts      Results for orders placed or performed in visit on 04/16/22  Hemoglobin A1c  Result Value Ref Range   Hgb A1c MFr Bld 5.5 <5.7 % of total Hgb   Mean Plasma Glucose 111  mg/dL   eAG (mmol/L) 6.2 mmol/L  Lipid panel  Result Value Ref Range   Cholesterol 171 <200 mg/dL   HDL 56 > OR = 50 mg/dL   Triglycerides 145 <150 mg/dL   LDL Cholesterol (Calc) 90 mg/dL (calc)   Total CHOL/HDL Ratio 3.1 <5.0 (calc)   Non-HDL Cholesterol (Calc) 115 <130 mg/dL (calc)  CBC with Differential/Platelet  Result Value Ref Range   WBC 5.4 3.8 - 10.8 Thousand/uL   RBC 4.26 3.80 - 5.10 Million/uL   Hemoglobin 12.3 11.7 - 15.5 g/dL   HCT 36.4 35.0 - 45.0 %   MCV 85.4 80.0 - 100.0 fL   MCH 28.9 27.0 - 33.0 pg   MCHC 33.8 32.0 - 36.0 g/dL   RDW 13.1 11.0 - 15.0 %   Platelets 251 140 - 400 Thousand/uL   MPV 10.6 7.5 - 12.5 fL   Neutro Abs 2,641 1,500 - 7,800 cells/uL   Lymphs Abs 2,079 850 - 3,900 cells/uL   Absolute Monocytes 432 200 - 950 cells/uL   Eosinophils Absolute 211 15 - 500 cells/uL   Basophils Absolute 38 0 - 200 cells/uL   Neutrophils Relative % 48.9 %   Total Lymphocyte 38.5 %   Monocytes Relative 8.0 %   Eosinophils Relative 3.9 %   Basophils Relative 0.7 %  COMPLETE METABOLIC PANEL WITH GFR  Result Value  Ref Range   Glucose, Bld 82 65 - 99 mg/dL   BUN 13 7 - 25 mg/dL   Creat 0.74 0.50 - 1.03 mg/dL   eGFR 97 > OR = 60 mL/min/1.61m   BUN/Creatinine Ratio SEE NOTE: 6 - 22 (calc)   Sodium 141 135 - 146 mmol/L   Potassium 4.0 3.5 - 5.3 mmol/L   Chloride 106 98 - 110 mmol/L   CO2 28 20 - 32 mmol/L   Calcium 9.4 8.6 - 10.4 mg/dL   Total Protein 6.8 6.1 - 8.1 g/dL   Albumin 4.6 3.6 - 5.1 g/dL   Globulin 2.2 1.9 - 3.7 g/dL (calc)   AG Ratio 2.1 1.0 - 2.5 (calc)   Total Bilirubin 0.4 0.2 - 1.2 mg/dL   Alkaline phosphatase (APISO) 85 37 - 153 U/L   AST 15 10 - 35 U/L   ALT 14 6 - 29 U/L  HIV Antibody (routine testing w rflx)  Result Value Ref Range   HIV 1&2 Ab, 4th Generation NON-REACTIVE NON-REACTIVE  Hepatitis C antibody  Result Value Ref Range   Hepatitis C Ab NON-REACTIVE NON-REACTIVE      Assessment & Plan:   Problem List Items Addressed This Visit   None Visit Diagnoses     Acute non-recurrent frontal sinusitis    -  Primary   Relevant Medications   clindamycin (CLEOCIN) 300 MG capsule   Dental infection       Relevant Medications   clindamycin (CLEOCIN) 300 MG capsule       Clinically with sinusitis infection  Also having dental infection, previously treated in past with Clindamycin for something similar Multiple drug allergies  Has dentist but unable to get in to be seen soon. They will arrange scheduling for this.  Start Clindamycin 3039mTHREE TIMES A DAY x 10 day course.    Meds ordered this encounter  Medications   clindamycin (CLEOCIN) 300 MG capsule    Sig: Take 1 capsule (300 mg total) by mouth 3 (three) times daily. For 10 days    Dispense:  30 capsule    Refill:  0  Follow up plan: Return if symptoms worsen or fail to improve.   [name], [facility] 06/10/2022, 3:28 PM

## 2022-06-10 NOTE — Patient Instructions (Addendum)
Thank you for coming to the office today.  Start Clindamycin 3 times a day for 10 days for sinusitis and dental infection  Please schedule with dentist upcoming for further management.  Please schedule a Follow-up Appointment to: Return if symptoms worsen or fail to improve.  If you have any other questions or concerns, please feel free to call the office or send a message through [place]. You may also schedule an earlier appointment if necessary.  Additionally, you may be receiving a survey about your experience at our office within a few days to 1 week by e-mail or mail. We value your feedback.  [name], DO [place]

## 2022-06-26 ENCOUNTER — Ambulatory Visit: Payer: Self-pay | Admitting: *Deleted

## 2022-06-26 NOTE — Telephone Encounter (Signed)
Reason for Disposition  [1] Sinus pain (not just congestion) AND [2] fever    I am having pain in the right side of my nose but it hurts and the pain is going to my eye.  No congestion or mucus or coughing.  Answer Assessment - Initial Assessment Questions 1. ONSET: "When did the nasal discharge start?"      My nose feels like it has something in it.  It's been broken before.   I'm having pain in my nose.  It's weird pain.      It's the right side of my nose.  The pain is moved to my eye.   2. AMOUNT: "How much discharge is there?"      I don't have congestion or cough. 3. COUGH: "Do you have a cough?" If Yes, ask: "Describe the color of your sputum" (clear, white, yellow, green)     No 4. RESPIRATORY DISTRESS: "Describe your breathing."      No trouble breathing.     I'm having anxiety dur to the pain in my nose. L  I'm in the middle of moving, 5. FEVER: "Do you have a fever?" If Yes, ask: "What is your temperature, how was it measured, and when did it start?"     Not asked 6. SEVERITY: "Overall, how bad are you feeling right now?" (e.g., doesn't interfere with normal activities, staying home from school/work, staying in bed)      Just having pain in my nose. 7. OTHER SYMPTOMS: "Do you have any other symptoms?" (e.g., sore throat, earache, wheezing, vomiting)     No 8. PREGNANCY: "Is there any chance you are pregnant?" "When was your last menstrual period?"     Not asked  Protocols used: Common Cold-A-AH  Chief Complaint: pain in nose and to her eye.     Symptoms: above Frequency: Now Pertinent Negatives: Patient denies Congestion, coughing, sore throat or anything.   Just her nose hurts Disposition: '[]'$ ED /'[x]'$ Urgent Care (no appt availability in office) / '[]'$ Appointment(In office/virtual)/ '[]'$  [facility]/ '[]'$ Home Care/ '[]'$ Refused Recommended Disposition /'[]'$ [place] [facility]/ '[]'$  Follow-up with PCP Additional Notes: Pt refusing to go to the urgent.   She is sitting in the  parking lot of [facility] wanting to do a walk in.    She became very argumentative not understanding why she could not just come in now and be seen.   I also let her know the office is closed for lunch right now and they aren't seeing pts at this moment.     I finally told her I would leave a note for Dr. Parks Ranger and let him decide what he wanted to do for your situation.    She was agreeable to this.     (She is in the middle of moving but is in the parking lot of the practice).

## 2022-06-26 NOTE — Telephone Encounter (Signed)
Patient scheduled for tomorrow with Rollene Fare. CM

## 2022-06-27 ENCOUNTER — Ambulatory Visit: Payer: Medicaid Other | Admitting: Physician Assistant

## 2022-06-27 ENCOUNTER — Encounter: Payer: Self-pay | Admitting: Physician Assistant

## 2022-06-27 ENCOUNTER — Ambulatory Visit (INDEPENDENT_AMBULATORY_CARE_PROVIDER_SITE_OTHER): Payer: Medicaid Other | Admitting: Physician Assistant

## 2022-06-27 VITALS — BP 120/78 | HR 82 | Ht 66.0 in | Wt 175.8 lb

## 2022-06-27 DIAGNOSIS — J309 Allergic rhinitis, unspecified: Secondary | ICD-10-CM

## 2022-06-27 MED ORDER — CETIRIZINE HCL 10 MG PO TABS: 10.0000 mg | ORAL_TABLET | Freq: Every day | ORAL | 0 refills | Status: DC

## 2022-06-27 MED ORDER — FLUTICASONE PROPIONATE 50 MCG/ACT NA SUSP: 2.0000 | Freq: Every day | NASAL | 0 refills | Status: DC

## 2022-06-27 NOTE — Progress Notes (Signed)
Acute Office Visit   Patient: Shirley [name]   DOB: 21-Jun-1971   51 y.o. Female  MRN: [id number] Visit Date: 06/27/2022  Today's healthcare provider: Dani Gobble Chelisa Hennen, PA-C  Introduced myself to the patient as a Journalist, newspaper and provided education on APPs in clinical practice.    Chief Complaint  Patient presents with   Headache   Subjective    HPI    Reports she is having nasal congestion and pain behind her right eye  States she felt a bit better after taking Clindamycin but this is causing diarrhea States she has run out of her Zyrtec  She reports it has been about a month since she had her flonase   Reports she has tested for COVID at home and this was negative     Medications: Outpatient Medications Prior to Visit  Medication Sig   acyclovir (ZOVIRAX) 400 MG tablet Take 400 mg by mouth 3 (three) times daily.   albuterol (PROAIR HFA) 108 (90 Base) MCG/ACT inhaler Inhale 2 puffs into the lungs every 6 (six) hours as needed for wheezing or shortness of breath.   ALPRAZolam (XANAX) 1 MG tablet Take 1 mg by mouth 3 (three) times daily as needed for anxiety.   cetirizine (ZYRTEC) 10 MG tablet Take 1 tablet (10 mg total) by mouth daily.   Cholecalciferol (VITAMIN D3) 125 MCG (5000 UT) CAPS Take by mouth.   clindamycin (CLEOCIN) 300 MG capsule Take 1 capsule (300 mg total) by mouth 3 (three) times daily. For 10 days   fluticasone (FLONASE) 50 MCG/ACT nasal spray SPRAY 2 SPRAYS INTO EACH NOSTRIL EVERY DAY   triamcinolone cream (KENALOG) 0.1 % Apply 1 Application topically 2 (two) times daily.   valsartan (DIOVAN) 80 MG tablet TAKE 1 TABLET BY MOUTH EVERY DAY   VITAMIN D PO Take 1 capsule by mouth in the morning and at bedtime.   No facility-administered medications prior to visit.    Review of Systems  Constitutional:  Negative for chills, fatigue and fever.  HENT:  Positive for rhinorrhea, sinus pressure and sinus pain. Negative for congestion.   Respiratory:  Negative for  cough, shortness of breath and wheezing.   Gastrointestinal:  Positive for diarrhea. Negative for nausea and vomiting.  Musculoskeletal:  Negative for myalgias.  Neurological:  Positive for headaches. Negative for dizziness and light-headedness.       Objective    BP 120/78   Pulse 82   Ht '5\' 6"'$  (1.676 m)   Wt 175 lb 12.8 oz (79.7 kg)   LMP  (LMP Unknown) Comment: last cycle over a year ago  SpO2 98%   BMI 28.37 kg/m    Physical Exam Vitals reviewed.  Constitutional:      General: She is awake.     Appearance: Normal appearance.  HENT:     Head: Normocephalic and atraumatic.     Nose: No nasal deformity, signs of injury or nasal tenderness.     Right Turbinates: Swollen.     Left Turbinates: Swollen.     Mouth/Throat:     Lips: Pink.     Mouth: Mucous membranes are moist.     Pharynx: Uvula midline. No oropharyngeal exudate or posterior oropharyngeal erythema.  Eyes:     General: Lids are normal. Gaze aligned appropriately.     Extraocular Movements: Extraocular movements intact.     Right eye: Normal extraocular motion and no nystagmus.     Left eye:  Normal extraocular motion and no nystagmus.     Conjunctiva/sclera: Conjunctivae normal.  Cardiovascular:     Rate and Rhythm: Normal rate and regular rhythm.     Heart sounds: Normal heart sounds.  Pulmonary:     Effort: Pulmonary effort is normal.     Breath sounds: No decreased air movement. No decreased breath sounds, wheezing, rhonchi or rales.  Lymphadenopathy:     Head:     Right side of head: No submental, submandibular or preauricular adenopathy.     Left side of head: No submental, submandibular or preauricular adenopathy.     Cervical:     Right cervical: No superficial or posterior cervical adenopathy.    Left cervical: No superficial or posterior cervical adenopathy.  Neurological:     Mental Status: She is alert.  Psychiatric:        Behavior: Behavior is cooperative.       No results found for  any visits on 06/27/22.  Assessment & Plan      No follow-ups on file.       Problem List Items Addressed This Visit   None Visit Diagnoses     Allergic rhinitis, unspecified seasonality, unspecified trigger    -  Primary Acute, likely exacerbation of allergies due to moving and home situation Recommend she restart Zyrtec and Flonase- refills provided today to assist with administration  She can also use Nettipot and humidifier to further assist with symptoms  OTC pain relievers as needed for headaches  Follow up as needed for persistent or progressing symptoms     Relevant Medications   cetirizine (ZYRTEC) 10 MG tablet   fluticasone (FLONASE) 50 MCG/ACT nasal spray        No follow-ups on file.   I, Harneet Noblett E Dontrail Blackwell, PA-C, have reviewed all documentation for this visit. The documentation on 06/27/22 for the exam, diagnosis, procedures, and orders are all accurate and complete.   Talitha Givens, MHS, PA-C [facility]

## 2022-06-27 NOTE — Patient Instructions (Signed)
To help with your symptoms I recommend the following Restart your Cetirizine once by mouth every day Restart the Flonase, two sprays in each nostril, once per day  You can use a Nettipot with cooled, boiled water or distilled water to flush the nasal passages   You can also use a humidifier at night to help with the nasal irritation

## 2022-07-02 ENCOUNTER — Ambulatory Visit (INDEPENDENT_AMBULATORY_CARE_PROVIDER_SITE_OTHER): Payer: Medicaid Other

## 2022-07-02 ENCOUNTER — Other Ambulatory Visit: Payer: Self-pay | Admitting: Obstetrics and Gynecology

## 2022-07-02 DIAGNOSIS — R140 Abdominal distension (gaseous): Secondary | ICD-10-CM

## 2022-07-02 DIAGNOSIS — R1030 Lower abdominal pain, unspecified: Secondary | ICD-10-CM | POA: Diagnosis not present

## 2022-07-02 DIAGNOSIS — N83201 Unspecified ovarian cyst, right side: Secondary | ICD-10-CM

## 2022-07-03 ENCOUNTER — Other Ambulatory Visit: Payer: Self-pay | Admitting: Family Medicine

## 2022-07-03 DIAGNOSIS — J4530 Mild persistent asthma, uncomplicated: Secondary | ICD-10-CM

## 2022-07-03 NOTE — Telephone Encounter (Signed)
Requested Prescriptions  Pending Prescriptions Disp Refills   VENTOLIN HFA 108 (90 Base) MCG/ACT inhaler [Pharmacy Med Name: VENTOLIN HFA 90 MCG INHALER] 6.7 each 3    Sig: TAKE 2 PUFFS BY MOUTH EVERY 6 HOURS AS NEEDED FOR WHEEZE OR SHORTNESS OF BREATH     Pulmonology:  [facility] 2 Passed - 07/03/2022 12:12 PM      Passed - Last BP in normal range    BP Readings from Last 1 Encounters:  06/27/22 120/78         Passed - Last Heart Rate in normal range    Pulse Readings from Last 1 Encounters:  06/27/22 82         Passed - Valid encounter within last 12 months    Recent Outpatient Visits           6 days ago Allergic rhinitis, unspecified seasonality, unspecified trigger   [facility] [place], [place] E, PA-C   3 weeks ago Acute non-recurrent frontal sinusitis   [facility] [place], Devonne Doughty, DO   2 months ago Acute cystitis with hematuria   [place], DO   3 months ago Acute cystitis with hematuria   [place], DO   4 months ago Acute bacterial conjunctivitis of left eye   [place], Devonne Doughty, [place]

## 2022-07-08 ENCOUNTER — Telehealth: Payer: Self-pay

## 2022-07-08 DIAGNOSIS — H1033 Unspecified acute conjunctivitis, bilateral: Secondary | ICD-10-CM

## 2022-07-08 MED ORDER — OFLOXACIN 0.3 % OP SOLN: 1.0000 [drp] | Freq: Four times a day (QID) | OPHTHALMIC | 0 refills | Status: DC

## 2022-07-08 NOTE — Telephone Encounter (Signed)
Will send rx eye drops.  She has allergy to sulfa, cannot take Polytrim antibiotic eye drops. Also allergy to erythryomycin ointment.  Will send rx Ofloxacin FQ eye drops, she has rash with levaquin oral.  Course rx for 7 days  [name], [facility] 07/08/2022, 5:01 PM

## 2022-07-08 NOTE — Telephone Encounter (Signed)
Copied from [place] [id number]. Topic: General - Inquiry >> Jul 08, 2022  4:04 PM Teressa P wrote: Reason for CRM:  Pt called saying she thinks she has pink eye and want to know if you will send in some drops to [facility].  She is moving right now. CB@  [phone]

## 2022-07-18 ENCOUNTER — Ambulatory Visit: Payer: Self-pay | Admitting: *Deleted

## 2022-07-18 NOTE — Telephone Encounter (Signed)
  Chief Complaint: dizziness, anxiety  Symptoms: reports no dizziness now. Right side weakness before due to stress of "kicked out of home" but better now. Reports she feels like she may have had a stroke. Reports mold exposure.  Frequency: na  Pertinent Negatives: Patient denies chest pain no difficulty breathing no fever no weakness on right side now no dizziness.  Disposition: '[x]'$ ED /'[]'$ Urgent Care (no appt availability in office) / '[]'$ Appointment(In office/virtual)/ '[]'$  [facility]/ '[]'$ Home Care/ '[]'$ Refused Recommended Disposition /'[]'$ [place] [facility]/ '[]'$  Follow-up with PCP Additional Notes:   Recommended to be evaluated due to sx and go to ED. Patient reports she was "kicked out of home" and now moving . Anxiety noted and gave # to [facility]. Appt scheduled as requested for anxiety management and testing for mold. Reinforced to patient to go to UC/ED for balance issues and dizziness  unsure of disposition.     Reason for Disposition  [1] MODERATE dizziness (e.g., interferes with normal activities) AND [2] has NOT been evaluated by doctor (or NP/PA) for this  (Exception: Dizziness caused by heat exposure, sudden standing, or poor fluid intake.)  Answer Assessment - Initial Assessment Questions 1. DESCRIPTION: "Describe your dizziness."     Lightheaded and weakness on one side of body  2. LIGHTHEADED: "Do you feel lightheaded?" (e.g., somewhat faint, woozy, weak upon standing)     Losing balance right side  3. VERTIGO: "Do you feel like either you or the room is spinning or tilting?" (i.e. vertigo)     na 4. SEVERITY: "How bad is it?"  "Do you feel like you are going to faint?" "Can you stand and walk?"   - MILD: Feels slightly dizzy, but walking normally.   - MODERATE: Feels unsteady when walking, but not falling; interferes with normal activities (e.g., school, work).   - SEVERE: Unable to walk without falling, or requires assistance to walk without falling; feels  like passing out now.      Reports dizziness gone now  5. ONSET:  "When did the dizziness begin?"     Na  6. AGGRAVATING FACTORS: "Does anything make it worse?" (e.g., standing, change in head position)     na 7. HEART RATE: "Can you tell me your heart rate?" "How many beats in 15 seconds?"  (Note: not all patients can do this)       na 8. CAUSE: "What do you think is causing the dizziness?"     Not sure thinks she may have had a stroke 9. RECURRENT SYMPTOM: "Have you had dizziness before?" If Yes, ask: "When was the last time?" "What happened that time?"     Yes  10. OTHER SYMPTOMS: "Do you have any other symptoms?" (e.g., fever, chest pain, vomiting, diarrhea, bleeding)       Right side weakness but now feels better, anxiety reported due to moving out of home  11. PREGNANCY: "Is there any chance you are pregnant?" "When was your last menstrual period?"       na  Protocols used: Dizziness - Lightheadedness-A-AH

## 2022-07-18 NOTE — Telephone Encounter (Signed)
Summary: Diziness Advice   Pt is calling to report that she has diziness, cogestion, inner ear problems. No appts today. Wanting advice      2nd attempt to contact patient to review sx. No answer, LVMTCB [phone].

## 2022-07-18 NOTE — Telephone Encounter (Signed)
Summary: Diziness Advice   Pt is calling to report that she has diziness, cogestion, inner ear problems. No appts today. Wanting advice         Attempted to call patient- left message to call office.

## 2022-07-18 NOTE — Telephone Encounter (Signed)
3rd attempt to contact patient to review dizziness and congestion , inner ear problems. No answer, LVMTCB and to go to UC  or ED for worsening sx. Office will reopen 07/22/22.

## 2022-07-23 ENCOUNTER — Ambulatory Visit: Payer: Medicaid Other | Admitting: Family Medicine

## 2022-07-25 ENCOUNTER — Ambulatory Visit: Payer: Medicaid Other | Admitting: Family Medicine

## 2022-07-25 ENCOUNTER — Encounter: Payer: Self-pay | Admitting: Family Medicine

## 2022-07-29 ENCOUNTER — Ambulatory Visit: Payer: Self-pay | Admitting: *Deleted

## 2022-07-29 ENCOUNTER — Ambulatory Visit: Payer: Self-pay

## 2022-07-29 ENCOUNTER — Telehealth (INDEPENDENT_AMBULATORY_CARE_PROVIDER_SITE_OTHER): Payer: Medicaid Other | Admitting: Family Medicine

## 2022-07-29 ENCOUNTER — Encounter: Payer: Self-pay | Admitting: Family Medicine

## 2022-07-29 DIAGNOSIS — F4310 Post-traumatic stress disorder, unspecified: Secondary | ICD-10-CM | POA: Diagnosis not present

## 2022-07-29 DIAGNOSIS — F411 Generalized anxiety disorder: Secondary | ICD-10-CM | POA: Diagnosis not present

## 2022-07-29 DIAGNOSIS — F331 Major depressive disorder, recurrent, moderate: Secondary | ICD-10-CM | POA: Diagnosis not present

## 2022-07-29 DIAGNOSIS — F410 Panic disorder [episodic paroxysmal anxiety] without agoraphobia: Secondary | ICD-10-CM

## 2022-07-29 NOTE — Patient Instructions (Addendum)
   Please schedule a Follow-up Appointment to: Return if symptoms worsen or fail to improve.  If you have any other questions or concerns, please feel free to call the office or send a message through MyChart. You may also schedule an earlier appointment if necessary.  Additionally, you may be receiving a survey about your experience at our office within a few days to 1 week by e-mail or mail. We value your feedback.  Crawford Tamura, DO [facility], [facility] 

## 2022-07-29 NOTE — Progress Notes (Addendum)
Subjective:    Patient ID: Shirley [name], female    DOB: 09/04/1970, 52 y.o.   MRN: [id number]  Shirley [name] is a 52 y.o. female presenting on 07/29/2022 for Anxiety  Virtual / Telehealth Encounter - Telephone Visit via [place] The purpose of this virtual visit is to provide medical care while limiting exposure to the novel coronavirus (COVID19) for both patient and office staff.  Consent was obtained for remote visit:  Yes.   Answered questions that patient had about telehealth interaction:  Yes.   I discussed the limitations, risks, security and privacy concerns of performing an evaluation and management service by video/telephone. I also discussed with the patient that there may be a patient responsible charge related to this service. The patient expressed understanding and agreed to proceed.  Patient Location: DSS location Provider Location: [facility] (Office)  Participants in virtual visit: - Patient: Shirley [name] - CMA: [name], CMA - Provider: Dr Parks Ranger   HPI  Anxiety, Panic Attacks Depression recurrent Dizziness PTSD ADHD - Followed by Dr Carlos Levering [facility] psychiatry - Managed on medication, Alprazolam 38m TID PRN - Taking previously Adderall 164mBID, Citalopram 2038maily  Now she has had complicated recent stressors including housing security concerns, car troubles with car breaking down, she has not been eating well lately, financial stressors  She admits issue with stressor received no show letter from our office, with several same day cancellations.  She was worried she would lose our office as PCP if no showed today  She expresses concerns with her anxiety still and plans to follow up with provider / or urgent care ED if severe worsening panic and symptoms flaring.     07/29/2022    3:30 PM 10/02/2021    2:19 PM 08/26/2021    3:56 PM  Depression screen PHQ 2/9  Decreased Interest 0 0 1  Down, Depressed, Hopeless _0 PHQ - 2  Score _1 Altered sleeping _2 Tired, decreased energy _3 Change in appetite 3 3 0  Feeling bad or failure about yourself  _4 Trouble concentrating _5 Moving slowly or fidgety/restless 0 0 0  Suicidal thoughts 0 0 0  PHQ-9 Score _6 Difficult doing work/chores Somewhat difficult Not difficult at all Somewhat difficult    Social History   Tobacco Use   Smoking status: Never    Passive exposure: Never   Smokeless tobacco: Never  Vaping Use   Vaping Use: Never used  Substance Use Topics   Alcohol use: Not Currently   Drug use: Never    Review of Systems Per HPI unless specifically indicated above     Objective:    LMP  (LMP Unknown) Comment: last cycle over a year ago  Wt Readings from Last 3 Encounters:  06/27/22 175 lb 12.8 oz (79.7 kg)  06/10/22 179 lb 3.2 oz (81.3 kg)  05/02/22 180 lb (81.6 kg)    Physical Exam  Virtual visit on telephone.   Results for orders placed or performed in visit on 04/16/22  Hemoglobin A1c  Result Value Ref Range   Hgb A1c MFr Bld 5.5 <5.7 % of total Hgb   Mean Plasma Glucose 111 mg/dL   eAG (mmol/L) 6.2 mmol/L  Lipid panel  Result Value Ref Range   Cholesterol 171 <200 mg/dL   HDL 56 > OR = 50 mg/dL  Triglycerides 145 <150 mg/dL   LDL Cholesterol (Calc) 90 mg/dL (calc)   Total CHOL/HDL Ratio 3.1 <5.0 (calc)   Non-HDL Cholesterol (Calc) 115 <130 mg/dL (calc)  CBC with Differential/Platelet  Result Value Ref Range   WBC 5.4 3.8 - 10.8 Thousand/uL   RBC 4.26 3.80 - 5.10 Million/uL   Hemoglobin 12.3 11.7 - 15.5 g/dL   HCT 36.4 35.0 - 45.0 %   MCV 85.4 80.0 - 100.0 fL   MCH 28.9 27.0 - 33.0 pg   MCHC 33.8 32.0 - 36.0 g/dL   RDW 13.1 11.0 - 15.0 %   Platelets 251 140 - 400 Thousand/uL   MPV 10.6 7.5 - 12.5 fL   Neutro Abs 2,641 1,500 - 7,800 cells/uL   Lymphs Abs 2,079 850 - 3,900 cells/uL   Absolute Monocytes 432 200 - 950 cells/uL   Eosinophils Absolute 211 15 - 500 cells/uL   Basophils Absolute  38 0 - 200 cells/uL   Neutrophils Relative % 48.9 %   Total Lymphocyte 38.5 %   Monocytes Relative 8.0 %   Eosinophils Relative 3.9 %   Basophils Relative 0.7 %  COMPLETE METABOLIC PANEL WITH GFR  Result Value Ref Range   Glucose, Bld 82 65 - 99 mg/dL   BUN 13 7 - 25 mg/dL   Creat 0.74 0.50 - 1.03 mg/dL   eGFR 97 > OR = 60 mL/min/1.37m   BUN/Creatinine Ratio SEE NOTE: 6 - 22 (calc)   Sodium 141 135 - 146 mmol/L   Potassium 4.0 3.5 - 5.3 mmol/L   Chloride 106 98 - 110 mmol/L   CO2 28 20 - 32 mmol/L   Calcium 9.4 8.6 - 10.4 mg/dL   Total Protein 6.8 6.1 - 8.1 g/dL   Albumin 4.6 3.6 - 5.1 g/dL   Globulin 2.2 1.9 - 3.7 g/dL (calc)   AG Ratio 2.1 1.0 - 2.5 (calc)   Total Bilirubin 0.4 0.2 - 1.2 mg/dL   Alkaline phosphatase (APISO) 85 37 - 153 U/L   AST 15 10 - 35 U/L   ALT 14 6 - 29 U/L  HIV Antibody (routine testing w rflx)  Result Value Ref Range   HIV 1&2 Ab, 4th Generation NON-REACTIVE NON-REACTIVE  Hepatitis C antibody  Result Value Ref Range   Hepatitis C Ab NON-REACTIVE NON-REACTIVE      Assessment & Plan:   Problem List Items Addressed This Visit     Generalized anxiety disorder with panic attacks - Primary   Major depressive disorder, recurrent, moderate (HCC)   PTSD (post-traumatic stress disorder)    Emphasis on following with her existing Psychiatry for management of her chronic mental health and she is currently addressing some social stressors with DSS at this time as well. We can link her up with additional social work / care management if needed as well.  Discussed no show policy and same day cancellation, she understands and we will try to keep working with her, and she will seek care if needed for future to avoid this issue.  No orders of the defined types were placed in this encounter.    Follow up plan: Return if symptoms worsen or fail to improve.   Patient verbalizes understanding with the above medical recommendations including the limitation  of remote medical advice.  Specific follow-up and call-back criteria were given for patient to follow-up or seek medical care more urgently if needed.  Total duration of direct patient care provided via telephone: 8 minutes   ANobie Putnam  DO [facility] 07/29/2022, 3:53 PM
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
## 2022-07-29 NOTE — Telephone Encounter (Signed)
  Chief Complaint: chest pain and anxiety Symptoms: chest pain and increased anxiety, weight loss  Frequency: Pertinent Negatives: NA Disposition: '[]'$ ED /'[]'$ Urgent Care (no appt availability in office) / '[]'$ Appointment(In office/virtual)/ '[]'$  [facility]/ '[]'$ Home Care/ '[]'$ Refused Recommended Disposition /'[]'$ [place] [facility]/ '[]'$  Follow-up with PCP Additional Notes: pt has appt scheduled for today at 1520. Pt called to say she got no show letter and had car broke down at [place] office and unsure how she was going to make it to appt. Advised her she needed to speak with Caryl Pina, Practice Admin and called and transferred to her successfully.   Answer Assessment - Initial Assessment Questions 1. CONCERN: "Did anything happen that prompted you to call today?"      Anxiety and chest pain  2. ANXIETY SYMPTOMS: "Can you describe how you (your loved one; patient) have been feeling?" (e.g., tense, restless, panicky, anxious, keyed up, overwhelmed, sense of impending doom).      Feeling  3. ONSET: "How long have you been feeling this way?" (e.g., hours, days, weeks)     *No Answer* 4. SEVERITY: "How would you rate the level of anxiety?" (e.g., 0 - 10; or mild, moderate, severe).     *No Answer* 5. FUNCTIONAL IMPAIRMENT: "How have these feelings affected your ability to do daily activities?" "Have you had more difficulty than usual doing your normal daily activities?" (e.g., getting better, same, worse; self-care, school, work, interactions)     *No Answer* 6. HISTORY: "Have you felt this way before?" "Have you ever been diagnosed with an anxiety problem in the past?" (e.g., generalized anxiety disorder, panic attacks, PTSD). If Yes, ask: "How was this problem treated?" (e.g., medicines, counseling, etc.)     *No Answer* 7. RISK OF HARM - SUICIDAL IDEATION: "Do you ever have thoughts of hurting or killing yourself?" If Yes, ask:  "Do you have these feelings now?" "Do you have a plan on how you would  do this?"     *No Answer* 8. TREATMENT:  "What has been done so far to treat this anxiety?" (e.g., medicines, relaxation strategies). "What has helped?"     *No Answer* 9. TREATMENT - THERAPIST: "Do you have a counselor or therapist? Name?"     *No Answer* 10. POTENTIAL TRIGGERS: "Do you drink caffeinated beverages (e.g., coffee, colas, teas), and how much daily?" "Do you drink alcohol or use any drugs?" "Have you started any new medicines recently?"       *No Answer* 11. PATIENT SUPPORT: "Who is with you now?" "Who do you live with?" "Do you have family or friends who you can talk to?"        *No Answer* 12. OTHER SYMPTOMS: "Do you have any other symptoms?" (e.g., feeling depressed, trouble concentrating, trouble sleeping, trouble breathing, palpitations or fast heartbeat, chest pain, sweating, nausea, or diarrhea)       *No Answer* 13. PREGNANCY: "Is there any chance you are pregnant?" "When was your last menstrual period?"       *No Answer*  Protocols used: Anxiety and Panic Attack-A-AH

## 2022-08-07 ENCOUNTER — Ambulatory Visit: Payer: Self-pay | Admitting: *Deleted

## 2022-08-07 DIAGNOSIS — K047 Periapical abscess without sinus: Secondary | ICD-10-CM
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
## 2022-08-08 MED ORDER — CLINDAMYCIN HCL 300 MG PO CAPS: 300.0000 mg | ORAL_CAPSULE | Freq: Three times a day (TID) | ORAL | 0 refills | Status: DC

## 2022-08-08 NOTE — Telephone Encounter (Signed)
I sent her a mychart message letting her know what Dr. Raliegh Ip suggests.

## 2022-08-08 NOTE — Telephone Encounter (Signed)
Please notify patient that Clindamycin was sent to her pharmacy.  If she is unable to come in or go to Urgent Care or do virtual visit with [place], please suggest  Virtual Urgent Care in future.  For this issue, also reiterate that she will need dentist as we do not manage this issue long term.  Thank you  [name], DO [facility] 08/08/2022, 8:14 AM

## 2022-08-08 NOTE — Addendum Note (Signed)
Addended by: Olin Hauser on: 08/08/2022 08:14 AM   Modules accepted: Orders

## 2022-08-14 ENCOUNTER — Telehealth (INDEPENDENT_AMBULATORY_CARE_PROVIDER_SITE_OTHER): Payer: Medicaid Other | Admitting: Family Medicine

## 2022-08-14 ENCOUNTER — Encounter: Payer: Self-pay | Admitting: Family Medicine

## 2022-08-14 VITALS — Ht 66.0 in | Wt 175.0 lb

## 2022-08-14 DIAGNOSIS — Z85828 Personal history of other malignant neoplasm of skin: Secondary | ICD-10-CM

## 2022-08-14 DIAGNOSIS — D485 Neoplasm of uncertain behavior of skin: Secondary | ICD-10-CM | POA: Diagnosis not present

## 2022-08-14 DIAGNOSIS — L720 Epidermal cyst: Secondary | ICD-10-CM

## 2022-08-14 NOTE — Patient Instructions (Signed)
   Please schedule a Follow-up Appointment to: No follow-ups on file. ° °If you have any other questions or concerns, please feel free to call the office or send a message through MyChart. You may also schedule an earlier appointment if necessary. ° °Additionally, you may be receiving a survey about your experience at our office within a few days to 1 week by e-mail or mail. We value your feedback. ° °Eden Rho, DO °[facility] °

## 2022-08-14 NOTE — Progress Notes (Signed)
Subjective:    Patient ID: Shirley [name], female    DOB: 1971/05/28, 52 y.o.   MRN: [id number]  Shirley [name] is a 52 y.o. female presenting on 08/14/2022 for Dental Pain  Virtual / Telehealth Encounter - Video Visit via [place] The purpose of this virtual visit is to provide medical care while limiting exposure to the novel coronavirus (COVID19) for both patient and office staff.  Consent was obtained for remote visit:  Yes.   Answered questions that patient had about telehealth interaction:  Yes.   I discussed the limitations, risks, security and privacy concerns of performing an evaluation and management service by video/telephone. I also discussed with the patient that there may be a patient responsible charge related to this service. The patient expressed understanding and agreed to proceed.  Patient Location: Home Provider Location: Shirley [name] (Office)  Participants in virtual visit: - Patient: Shirley [name] - CMA: Shirley [name], CMA - Provider: Dr Parks [name]   HPI  Left Tooth infection, some improved on Clindamycin Trying to find dentist on Medicaid  Knot on L Side of Scalp Feels like a "BB" under it. Does not hurt. No injury. Possible cyst  Skin Lesion / Forehead area scalp Previously seen [facility] - Shirley [name] years prior History of basal cell skin cancer.  Vertigo / Balance / Episodes in November previously seen. Seems could have been related to sinus Admits dizziness with her dental pain at times.      07/29/2022    3:30 PM 10/02/2021    2:19 PM 08/26/2021    3:56 PM  Depression screen PHQ 2/9  Decreased Interest 0 0 1  Down, Depressed, Hopeless '1 1 1  '$ PHQ - 2 Score '1 1 2  '$ Altered sleeping '1 1 3  '$ Tired, decreased energy '1 1 3  '$ Change in appetite 3 3 0  Feeling bad or failure about yourself  '1 1 1  '$ Trouble concentrating '3 3 2  '$ Moving slowly or fidgety/restless 0 0 0  Suicidal thoughts 0 0 0  PHQ-9 Score '10 10 11   '$ Difficult doing work/chores Somewhat difficult Not difficult at all Somewhat difficult    Social History   Tobacco Use   Smoking status: Never    Passive exposure: Never   Smokeless tobacco: Never  Vaping Use   Vaping Use: Never used  Substance Use Topics   Alcohol use: Not Currently   Drug use: Never    Review of Systems Per HPI unless specifically indicated above     Objective:    Ht '5\' 6"'$  (1.676 m)   Wt 175 lb (79.4 kg)   LMP  (LMP Unknown) Comment: last cycle over a year ago  BMI 28.25 kg/m   Wt Readings from Last 3 Encounters:  08/14/22 175 lb (79.4 kg)  06/27/22 175 lb 12.8 oz (79.7 kg)  06/10/22 179 lb 3.2 oz (81.3 kg)    Physical Exam  Note examination was completely remotely via video observation objective data only  Gen - well-appearing, no acute distress or apparent pain, comfortable HEENT - eyes appear clear without discharge or redness Heart/Lungs - cannot examine virtually - observed no evidence of coughing or labored breathing. Abd - cannot examine virtually  Skin - skin lesion on forehead difficult to discern on video, also scalp nodule difficult to see Neuro - awake, alert, oriented Psych - not anxious appearing   Results for orders placed or performed in visit on 04/16/22  Hemoglobin A1c  Result Value Ref Range   Hgb A1c MFr Bld 5.5 <5.7 % of total Hgb   Mean Plasma Glucose 111 mg/dL   eAG (mmol/L) 6.2 mmol/L  Lipid panel  Result Value Ref Range   Cholesterol 171 <200 mg/dL   HDL 56 > OR = 50 mg/dL   Triglycerides 145 <150 mg/dL   LDL Cholesterol (Calc) 90 mg/dL (calc)   Total CHOL/HDL Ratio 3.1 <5.0 (calc)   Non-HDL Cholesterol (Calc) 115 <130 mg/dL (calc)  CBC with Differential/Platelet  Result Value Ref Range   WBC 5.4 3.8 - 10.8 Thousand/uL   RBC 4.26 3.80 - 5.10 Million/uL   Hemoglobin 12.3 11.7 - 15.5 g/dL   HCT 36.4 35.0 - 45.0 %   MCV 85.4 80.0 - 100.0 fL   MCH 28.9 27.0 - 33.0 pg   MCHC 33.8 32.0 - 36.0 g/dL   RDW 13.1  11.0 - 15.0 %   Platelets 251 140 - 400 Thousand/uL   MPV 10.6 7.5 - 12.5 fL   Neutro Abs 2,641 1,500 - 7,800 cells/uL   Lymphs Abs 2,079 850 - 3,900 cells/uL   Absolute Monocytes 432 200 - 950 cells/uL   Eosinophils Absolute 211 15 - 500 cells/uL   Basophils Absolute 38 0 - 200 cells/uL   Neutrophils Relative % 48.9 %   Total Lymphocyte 38.5 %   Monocytes Relative 8.0 %   Eosinophils Relative 3.9 %   Basophils Relative 0.7 %  COMPLETE METABOLIC PANEL WITH GFR  Result Value Ref Range   Glucose, Bld 82 65 - 99 mg/dL   BUN 13 7 - 25 mg/dL   Creat 0.74 0.50 - 1.03 mg/dL   eGFR 97 > OR = 60 mL/min/1.52m   BUN/Creatinine Ratio SEE NOTE: 6 - 22 (calc)   Sodium 141 135 - 146 mmol/L   Potassium 4.0 3.5 - 5.3 mmol/L   Chloride 106 98 - 110 mmol/L   CO2 28 20 - 32 mmol/L   Calcium 9.4 8.6 - 10.4 mg/dL   Total Protein 6.8 6.1 - 8.1 g/dL   Albumin 4.6 3.6 - 5.1 g/dL   Globulin 2.2 1.9 - 3.7 g/dL (calc)   AG Ratio 2.1 1.0 - 2.5 (calc)   Total Bilirubin 0.4 0.2 - 1.2 mg/dL   Alkaline phosphatase (APISO) 85 37 - 153 U/L   AST 15 10 - 35 U/L   ALT 14 6 - 29 U/L  HIV Antibody (routine testing w rflx)  Result Value Ref Range   HIV 1&2 Ab, 4th Generation NON-REACTIVE NON-REACTIVE  Hepatitis C antibody  Result Value Ref Range   Hepatitis C Ab NON-REACTIVE NON-REACTIVE      Assessment & Plan:   Problem List Items Addressed This Visit   None Visit Diagnoses     Epidermoid cyst of skin of scalp    -  Primary   Neoplasm of uncertain behavior of skin of face       History of basal cell carcinoma (BCC)           Limited by video visit for dermatologic complaints, but scalp lesion appears to be a cyst based on history. Does not sound consistent with abscess or infection. Possible hematoma if injury but she denied injury.  Non healing lesion on face / pigmented skin spot of uncertain behavior  [facility] management, given her past  history of skin cancer BCC  Dental infection - reviewed updates, she should still pursue dental evaluation, she  has list of dentists locally that can assist, she will check medicaid coverage  No orders of the defined types were placed in this encounter.     Follow up plan: No follow-ups on file.   Patient verbalizes understanding with the above medical recommendations including the limitation of remote medical advice.  Specific follow-up and call-back criteria were given for patient to follow-up or seek medical care more urgently if needed.  Total duration of direct patient care provided via video conference: 10 minutes   [name], [facility] 08/14/2022, 11:55 AM

## 2022-08-23 ENCOUNTER — Other Ambulatory Visit: Payer: Self-pay | Admitting: Physician Assistant

## 2022-08-23 DIAGNOSIS — J309 Allergic rhinitis, unspecified: Secondary | ICD-10-CM

## 2022-08-25 NOTE — Telephone Encounter (Signed)
Requested medication (s) are due for refill today - unsure  Requested medication (s) are on the active medication list -yes  Future visit scheduled -no  Last refill: 06/27/22 17m  Notes to clinic: Rx for acute visit- request sent for review of RF request  Requested Prescriptions  Pending Prescriptions Disp Refills   fluticasone (FLONASE) 50 MCG/ACT nasal spray [Pharmacy Med Name: FLUTICASONE PROP 50 MCG SPRAY] 16 mL 0    Sig: SPRAY 2 SPRAYS INTO EACH NOSTRIL EVERY DAY     Ear, Nose, and Throat: Nasal Preparations - Corticosteroids Passed - 08/23/2022  2:45 PM      Passed - Valid encounter within last 12 months    Recent Outpatient Visits           1 week ago Epidermoid cyst of skin of scalp   [place]   3 weeks ago Generalized anxiety disorder with panic attacks   [place]   1 month ago Allergic rhinitis, unspecified seasonality, unspecified trigger   [facility] [place]  2 months ago Acute non-recurrent frontal sinusitis   [facility]   4 months ago Acute cystitis with hematuria   [place]                 Requested Prescriptions  Pending Prescriptions Disp Refills   fluticasone (FLONASE) 50 MCG/ACT nasal spray [Pharmacy Med Name: FLUTICASONE PROP 50 MCG SPRAY] 16 mL 0    Sig: SPRAY 2 SPRAYS INTO EACH NOSTRIL EVERY DAY     Ear, Nose, and Throat: Nasal Preparations - Corticosteroids Passed - 08/23/2022  2:45 PM      Passed - Valid encounter within last 12 months    Recent Outpatient Visits           1 week ago Epidermoid cyst of skin of scalp   [place] [place] DO   3 weeks ago Generalized anxiety disorder with panic attacks   [place]   1 month ago Allergic rhinitis, unspecified seasonality, unspecified trigger   [facility] [place]  2 months ago Acute non-recurrent frontal sinusitis   [place] DO   4 months ago Acute cystitis with hematuria   [place]

## 2022-08-27 ENCOUNTER — Ambulatory Visit: Payer: Medicaid Other | Admitting: Family Medicine

## 2022-08-27 ENCOUNTER — Encounter: Payer: Self-pay | Admitting: Family Medicine

## 2022-08-27 VITALS — BP 118/80 | HR 84 | Ht 66.0 in | Wt 174.0 lb

## 2022-08-27 DIAGNOSIS — S0003XS Contusion of scalp, sequela: Secondary | ICD-10-CM

## 2022-08-27 DIAGNOSIS — L720 Epidermal cyst: Secondary | ICD-10-CM

## 2022-08-27 DIAGNOSIS — K047 Periapical abscess without sinus: Secondary | ICD-10-CM | POA: Diagnosis not present

## 2022-08-27 DIAGNOSIS — Z85828 Personal history of other malignant neoplasm of skin: Secondary | ICD-10-CM

## 2022-08-27 DIAGNOSIS — D485 Neoplasm of uncertain behavior of skin: Secondary | ICD-10-CM | POA: Diagnosis not present

## 2022-08-27 LAB — CBC WITH DIFFERENTIAL/PLATELET
Absolute Monocytes: 437 cells/uL (ref 200–950)
Basophils Absolute: 57 cells/uL (ref 0–200)
Basophils Relative: 1.1 %
Eosinophils Absolute: 187 cells/uL (ref 15–500)
Eosinophils Relative: 3.6 %
HCT: 36.4 % (ref 35.0–45.0)
Hemoglobin: 12.3 g/dL (ref 11.7–15.5)
Lymphs Abs: 1758 cells/uL (ref 850–3900)
MCH: 29.4 pg (ref 27.0–33.0)
MCHC: 33.8 g/dL (ref 32.0–36.0)
MCV: 87.1 fL (ref 80.0–100.0)
MPV: 10.3 fL (ref 7.5–12.5)
Monocytes Relative: 8.4 %
Neutro Abs: 2761 cells/uL (ref 1500–7800)
Neutrophils Relative %: 53.1 %
Platelets: 252 10*3/uL (ref 140–400)
RBC: 4.18 10*6/uL (ref 3.80–5.10)
RDW: 13 % (ref 11.0–15.0)
Total Lymphocyte: 33.8 %
WBC: 5.2 10*3/uL (ref 3.8–10.8)

## 2022-08-27 MED ORDER — CLINDAMYCIN HCL 300 MG PO CAPS: 300.0000 mg | ORAL_CAPSULE | Freq: Three times a day (TID) | ORAL | 0 refills | Status: DC

## 2022-08-27 NOTE — Patient Instructions (Addendum)
Thank you for coming to the office today.  Start Clindamycin when ready with apt for dentist  Knot on scalp appears more cyst or hematoma, and does not appear to be infectious.  Referral back to [facility]  You should be cleared to proceed w/ Dentist to treat / pull tooth.  Lab today STAT rushed to get CBC Blood Count, we can contact you with result.  Please schedule a Follow-up Appointment to: Return if symptoms worsen or fail to improve.  If you have any other questions or concerns, please feel free to call the office or send a message through [place]. You may also schedule an earlier appointment if necessary.  Additionally, you may be receiving a survey about your experience at our office within a few days to 1 week by e-mail or mail. We value your feedback.  [name], DO [place]
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
## 2022-09-01 ENCOUNTER — Telehealth: Payer: Self-pay

## 2022-09-01 NOTE — Telephone Encounter (Signed)
Vicente Males calling from [employer] to see if Dr. Raliegh Ip has reviewed results from CBC on 08/27/22. Manuella Ghazi that provider has reviewed and sent FU to pt via Mychart. No further assistance needed.

## 2022-09-13 ENCOUNTER — Other Ambulatory Visit: Payer: Self-pay | Admitting: Physician Assistant

## 2022-09-13 ENCOUNTER — Other Ambulatory Visit: Payer: Self-pay | Admitting: Urology

## 2022-09-13 DIAGNOSIS — J309 Allergic rhinitis, unspecified: Secondary | ICD-10-CM

## 2022-09-15 NOTE — Telephone Encounter (Signed)
Requested Prescriptions  Pending Prescriptions Disp Refills   cetirizine (ZYRTEC) 10 MG tablet [Pharmacy Med Name: CETIRIZINE HCL 10 MG TABLET] 90 tablet 0    Sig: TAKE 1 TABLET BY MOUTH EVERY DAY     Ear, Nose, and Throat:  Antihistamines 2 Passed - 09/13/2022  6:15 PM      Passed - Cr in normal range and within 360 days    Creat  Date Value Ref Range Status  04/17/2022 0.74 0.50 - 1.03 mg/dL Final         Passed - Valid encounter within last 12 months    Recent Outpatient Visits           2 weeks ago Dental infection   [place], DO   1 month ago Epidermoid cyst of skin of scalp   [place], DO   1 month ago Generalized anxiety disorder with panic attacks   [place], DO   2 months ago Allergic rhinitis, unspecified seasonality, unspecified trigger   [facility], [place], [place]   3 months ago Acute non-recurrent frontal sinusitis   [place], [place]

## 2022-09-17 ENCOUNTER — Telehealth: Payer: Self-pay

## 2022-09-17 ENCOUNTER — Encounter: Payer: Self-pay | Admitting: Family Medicine

## 2022-09-17 DIAGNOSIS — H1033 Unspecified acute conjunctivitis, bilateral: Secondary | ICD-10-CM

## 2022-09-17 NOTE — Telephone Encounter (Signed)
Patient requesting to switch referral from [facility] to [facility].  Will route to [facility] for review.  [name], [facility] 09/17/2022, 5:48 PM

## 2022-09-17 NOTE — Telephone Encounter (Signed)
Copied from [place] [id number]. Topic: Referral - Request for Referral >> Sep 17, 2022  3:04 PM Leone Payor F wrote: Has patient seen PCP for this complaint? Yes.    Referral for which specialty: Dermatology   Preferred provider/office: [facility]- [place], [place], [place] [address]  Reason for referral: Patient was referred to [facility] and she says the staff was very rude. She wants to be transferred to [facility]

## 2022-09-24 ENCOUNTER — Encounter: Payer: Self-pay | Admitting: Family Medicine

## 2022-09-24 ENCOUNTER — Ambulatory Visit: Payer: Self-pay | Admitting: *Deleted

## 2022-09-24 ENCOUNTER — Telehealth (INDEPENDENT_AMBULATORY_CARE_PROVIDER_SITE_OTHER): Payer: Medicaid Other | Admitting: Family Medicine

## 2022-09-24 VITALS — Ht 66.0 in | Wt 174.0 lb

## 2022-09-24 DIAGNOSIS — F411 Generalized anxiety disorder: Secondary | ICD-10-CM | POA: Diagnosis not present

## 2022-09-24 DIAGNOSIS — I10 Essential (primary) hypertension: Secondary | ICD-10-CM | POA: Diagnosis not present

## 2022-09-24 DIAGNOSIS — F410 Panic disorder [episodic paroxysmal anxiety] without agoraphobia: Secondary | ICD-10-CM

## 2022-09-24 MED ORDER — VALSARTAN 160 MG PO TABS: 160.0000 mg | ORAL_TABLET | Freq: Every day | ORAL | 1 refills | Status: DC

## 2022-09-24 NOTE — Telephone Encounter (Signed)
  Chief Complaint: shortness of breath  Symptoms: chest tightness, shortness of breath even at rest since OV today after going up stairs.  Frequency: this am  Pertinent Negatives: Patient denies difficulty breathing no wheezing reported. No rapid heart rate. No dizziness or lightheadedness Disposition: []$ ED /[x]$ Urgent Care (no appt availability in office) / []$ Appointment(In office/virtual)/ []$  [place] [facility]/ []$ Home Care/ []$ Refused Recommended Disposition /[]$ [place] [facility]/ []$  Follow-up with PCP Additional Notes:   Recommended if sx constant to go to UC or ED for evaluation. Last seen in [place] today . Patient requesting if medications could be sent to a different pharmacy. [facility] if possible .       Reason for Disposition  [1] MILD difficulty breathing (e.g., minimal/no SOB at rest, SOB with walking, pulse <100) AND [2] NEW-onset or WORSE than normal  Answer Assessment - Initial Assessment Questions 1. RESPIRATORY STATUS: "Describe your breathing?" (e.g., wheezing, shortness of breath, unable to speak, severe coughing)      Shortness of breath  2. ONSET: "When did this breathing problem begin?"      After going up steps prior to OV today  3. PATTERN "Does the difficult breathing come and go, or has it been constant since it started?"      Constant  4. SEVERITY: "How bad is your breathing?" (e.g., mild, moderate, severe)    - MILD: No SOB at rest, mild SOB with walking, speaks normally in sentences, can lie down, no retractions, pulse < 100.    - MODERATE: SOB at rest, SOB with minimal exertion and prefers to sit, cannot lie down flat, speaks in phrases, mild retractions, audible wheezing, pulse 100-120.    - SEVERE: Very SOB at rest, speaks in single words, struggling to breathe, sitting hunched forward, retractions, pulse > 120      Feels like tightness in chest and shortness of breath at rest. 5. RECURRENT  SYMPTOM: "Have you had difficulty breathing before?" If Yes, ask: "When was the last time?" and "What happened that time?"      Yes  6. CARDIAC HISTORY: "Do you have any history of heart disease?" (e.g., heart attack, angina, bypass surgery, angioplasty)      See hx  7. LUNG HISTORY: "Do you have any history of lung disease?"  (e.g., pulmonary embolus, asthma, emphysema)     Asthma  8. CAUSE: "What do you think is causing the breathing problem?"      Na  9. OTHER SYMPTOMS: "Do you have any other symptoms? (e.g., dizziness, runny nose, cough, chest pain, fever)     Chest pain tightness in the middle, anxiety SOB with exertion and rest .  10. O2 SATURATION MONITOR:  "Do you use an oxygen saturation monitor (pulse oximeter) at home?" If Yes, ask: "What is your reading (oxygen level) today?" "What is your usual oxygen saturation reading?" (e.g., 95%)       na 11. PREGNANCY: "Is there any chance you are pregnant?" "When was your last menstrual period?"       na 12. TRAVEL: "Have you traveled out of the country in the last month?" (e.g., travel history, exposures)       na  Protocols used: Breathing Difficulty-A-AH

## 2022-09-24 NOTE — Progress Notes (Signed)
Subjective:    Patient ID: Shirley [name], female    DOB: July 10, 1971, 52 y.o.   MRN: [id number]  Shirley [name] is a 52 y.o. female presenting on 09/24/2022 for Hypertension  Virtual / Telehealth Encounter - Video Visit via MyChart The purpose of this virtual visit is to provide medical care while limiting exposure to the novel coronavirus (COVID19) for both patient and office staff.  Consent was obtained for remote visit:  Yes.   Answered questions that patient had about telehealth interaction:  Yes.   I discussed the limitations, risks, security and privacy concerns of performing an evaluation and management service by video/telephone. I also discussed with the patient that there may be a patient responsible charge related to this service. The patient expressed understanding and agreed to proceed.  Patient Location: Home Provider Location: [facility] (Office)  Participants in virtual visit: - Patient: Shirley [name] - CMA: [name], CMA - Provider: Dr Parks Ranger   HPI  Hypertension Generalized Anxiety w Panic Attacks  She has multiple stressors to report today all related to social situation and barriers currently experiencing with her move. Admits Lead paint, environmental health came out. Case was opened in 2019 but no further issues. She said no peeling paint.  She admits stress is raising her BP. DBP up to 100-120 at times. Readings in office were better. On Valsartan '80mg'$  daily, asking about dosage  Admits stress related asthma, stress related BP Poor sleep, nightmares  Denies headache chest pain dyspnea     07/29/2022    3:30 PM 10/02/2021    2:19 PM 08/26/2021    3:56 PM  Depression screen PHQ 2/9  Decreased Interest 0 0 1  Down, Depressed, Hopeless '1 1 1  '$ PHQ - 2 Score '1 1 2  '$ Altered sleeping '1 1 3  '$ Tired, decreased energy '1 1 3  '$ Change in appetite 3 3 0  Feeling bad or failure about yourself  '1 1 1  '$ Trouble concentrating '3 3 2  '$ Moving  slowly or fidgety/restless 0 0 0  Suicidal thoughts 0 0 0  PHQ-9 Score '10 10 11  '$ Difficult doing work/chores Somewhat difficult Not difficult at all Somewhat difficult    Social History   Tobacco Use   Smoking status: Never    Passive exposure: Never   Smokeless tobacco: Never  Vaping Use   Vaping Use: Never used  Substance Use Topics   Alcohol use: Not Currently   Drug use: Never    Review of Systems Per HPI unless specifically indicated above     Objective:    Ht '5\' 6"'$  (1.676 m)   Wt 174 lb (78.9 kg)   LMP  (LMP Unknown) Comment: last cycle over a year ago  BMI 28.08 kg/m   Wt Readings from Last 3 Encounters:  09/24/22 174 lb (78.9 kg)  08/27/22 174 lb (78.9 kg)  08/14/22 175 lb (79.4 kg)    Physical Exam  Note examination was completely remotely via video observation objective data only  Gen - well-appearing, no acute distress or apparent pain, comfortable HEENT - eyes appear clear without discharge or redness Heart/Lungs - cannot examine virtually - observed no evidence of coughing or labored breathing. Abd - cannot examine virtually  Skin - face visible today- no rash Neuro - awake, alert, oriented Psych - anxious, rapid speech  Results for orders placed or performed in visit on 08/27/22  CBC with Differential/Platelet  Result Value Ref Range  WBC 5.2 3.8 - 10.8 Thousand/uL   RBC 4.18 3.80 - 5.10 Million/uL   Hemoglobin 12.3 11.7 - 15.5 g/dL   HCT 36.4 35.0 - 45.0 %   MCV 87.1 80.0 - 100.0 fL   MCH 29.4 27.0 - 33.0 pg   MCHC 33.8 32.0 - 36.0 g/dL   RDW 13.0 11.0 - 15.0 %   Platelets 252 140 - 400 Thousand/uL   MPV 10.3 7.5 - 12.5 fL   Neutro Abs 2,761 1,500 - 7,800 cells/uL   Lymphs Abs 1,758 850 - 3,900 cells/uL   Absolute Monocytes 437 200 - 950 cells/uL   Eosinophils Absolute 187 15 - 500 cells/uL   Basophils Absolute 57 0 - 200 cells/uL   Neutrophils Relative % 53.1 %   Total Lymphocyte 33.8 %   Monocytes Relative 8.4 %   Eosinophils  Relative 3.6 %   Basophils Relative 1.1 %      Assessment & Plan:   Problem List Items Addressed This Visit     Essential hypertension - Primary   Relevant Medications   valsartan (DIOVAN) 160 MG tablet   Generalized anxiety disorder with panic attacks    HYPERTENSION Elevated due to stress and anxiety Double dose Valsartan from '80mg'$  daily to '160mg'$  daily New rx sent  Continue with Psychiatry for Anxiety treatment.  Meds ordered this encounter  Medications   valsartan (DIOVAN) 160 MG tablet    Sig: Take 1 tablet (160 mg total) by mouth daily.    Dispense:  90 tablet    Refill:  1    Follow up plan: Return if symptoms worsen or fail to improve.  Patient verbalizes understanding with the above medical recommendations including the limitation of remote medical advice.  Specific follow-up and call-back criteria were given for patient to follow-up or seek medical care more urgently if needed.  Total duration of direct patient care provided via video conference: 18 minutes   [name], [facility] 09/24/2022, 4:32 PM

## 2022-09-24 NOTE — Patient Instructions (Addendum)
   Please schedule a Follow-up Appointment to: Return if symptoms worsen or fail to improve.  If you have any other questions or concerns, please feel free to call the office or send a message through MyChart. You may also schedule an earlier appointment if necessary.  Additionally, you may be receiving a survey about your experience at our office within a few days to 1 week by e-mail or mail. We value your feedback.  Alina Gilkey, DO [facility] 

## 2022-09-25 ENCOUNTER — Ambulatory Visit: Payer: Self-pay

## 2022-09-25 NOTE — Telephone Encounter (Signed)
  Chief Complaint: diarrhea Symptoms: diarrhea, abdominal pain, nausea and vomiting Frequency: 2 days Pertinent Negatives: NA Disposition: []$ ED /[x]$ Urgent Care (no appt availability in office) / []$ Appointment(In office/virtual)/ []$  [facility]/ []$ Home Care/ []$ Refused Recommended Disposition /[]$ [place] [facility]/ []$  Follow-up with PCP Additional Notes: pt asked if a stomach virus was going around based on sx. She states she is trying to drink Gatorade but feels unable to keep down d/t nausea. Pt states she has had diarrhea x 20 times now. I advised her to go to UC but pt states she wants to see how sx go tonight and will see how she feels in morning.   Reason for Disposition  [1] Drinking very little AND [2] dehydration suspected (e.g., no urine > 12 hours, very dry mouth, very lightheaded)  Answer Assessment - Initial Assessment Questions 1. DIARRHEA SEVERITY: "How bad is the diarrhea?" "How many more stools have you had in the past 24 hours than normal?"    - NO DIARRHEA (SCALE 0)   - MILD (SCALE 1-3): Few loose or mushy BMs; increase of 1-3 stools over normal daily number of stools; mild increase in ostomy output.   -  MODERATE (SCALE 4-7): Increase of 4-6 stools daily over normal; moderate increase in ostomy output.   -  SEVERE (SCALE 8-10; OR "WORST POSSIBLE"): Increase of 7 or more stools daily over normal; moderate increase in ostomy output; incontinence.     5-10 2. ONSET: "When did the diarrhea begin?"      2 days  3. BM CONSISTENCY: "How loose or watery is the diarrhea?"      diarrhea 4. VOMITING: "Are you also vomiting?" If Yes, ask: "How many times in the past 24 hours?"      yes 5. ABDOMEN PAIN: "Are you having any abdomen pain?" If Yes, ask: "What does it feel like?" (e.g., crampy, dull, intermittent, constant)      At times  6. ABDOMEN PAIN SEVERITY: If present, ask: "How bad is the pain?"  (e.g., Scale 1-10; mild, moderate, or severe)   - MILD (1-3): doesn't  interfere with normal activities, abdomen soft and not tender to touch    - MODERATE (4-7): interferes with normal activities or awakens from sleep, abdomen tender to touch    - SEVERE (8-10): excruciating pain, doubled over, unable to do any normal activities       moderate 7. ORAL INTAKE: If vomiting, "Have you been able to drink liquids?" "How much liquids have you had in the past 24 hours?"     Yes 8. HYDRATION: "Any signs of dehydration?" (e.g., dry mouth [not just dry lips], too weak to stand, dizziness, new weight loss) "When did you last urinate?"     Yes at times  Protocols used: [facility]

## 2022-09-25 NOTE — Telephone Encounter (Signed)
I spoke with her on virtual video visit within 30 minutes of this phone call. It looks like she called 30 minutes later. I am unsure why she is calling back with this complaint now.  I did order higher BP medication dose Valsartan [facility] [place].  I do not see a listed other [facility] pharmacy to transfer.  I am not sure what else I can do to help her at the moment. She can be seen at urgent care if worsening or severe symptoms.  [name], DO [facility] 09/25/2022, 10:01 AM

## 2022-09-27 ENCOUNTER — Encounter: Payer: Self-pay | Admitting: Intensive Care

## 2022-09-27 ENCOUNTER — Emergency Department
Admission: EM | Admit: 2022-09-27 | Discharge: 2022-09-27 | Disposition: A | Discharge status: Home / Self Care | Payer: Medicaid Other | Attending: Emergency Medicine | Admitting: Emergency Medicine

## 2022-09-27 ENCOUNTER — Other Ambulatory Visit: Payer: Self-pay

## 2022-09-27 DIAGNOSIS — L03112 Cellulitis of left axilla: Secondary | ICD-10-CM | POA: Diagnosis not present

## 2022-09-27 LAB — CBC
HCT: 38.1 % (ref 36.0–46.0)
Hemoglobin: 12.6 g/dL (ref 12.0–15.0)
MCH: 28.7 pg (ref 26.0–34.0)
MCHC: 33.1 g/dL (ref 30.0–36.0)
MCV: 86.8 fL (ref 80.0–100.0)
Platelets: 295 10*3/uL (ref 150–400)
RBC: 4.39 MIL/uL (ref 3.87–5.11)
RDW: 12.9 % (ref 11.5–15.5)
WBC: 7 10*3/uL (ref 4.0–10.5)
nRBC: 0 % (ref 0.0–0.2)

## 2022-09-27 LAB — BASIC METABOLIC PANEL
Anion gap: 9 (ref 5–15)
BUN: 14 mg/dL (ref 6–20)
CO2: 27 mmol/L (ref 22–32)
Calcium: 9.1 mg/dL (ref 8.9–10.3)
Chloride: 103 mmol/L (ref 98–111)
Creatinine, Ser: 0.75 mg/dL (ref 0.44–1.00)
GFR, Estimated: >60 mL/min (ref >60)
Glucose, Bld: 95 mg/dL (ref 70–99)
Potassium: 3.6 mmol/L (ref 3.5–5.1)
Sodium: 139 mmol/L (ref 135–145)

## 2022-09-27 MED ORDER — CLINDAMYCIN HCL 300 MG PO CAPS: 300.0000 mg | ORAL_CAPSULE | Freq: Four times a day (QID) | ORAL | 0 refills | Status: AC

## 2022-09-27 NOTE — ED Provider Notes (Signed)
[facility] Provider Note  Patient Contact: 8:51 PM (approximate)   History   Cellulitis   HPI  Shirley [name] is a 52 y.o. female who presents the emergency department complaining of a rash to the left axilla.  Patient states that she has had some recurrent issues with cellulitis.  She had a bad blood drawn that resulted in a severe infection several years ago, since then she has noticed that every time she has a small cut or abrasion she tends to get cellulitis.  Patient was shaving with a razor few days ago, felt like she nicked her armpit and has developed some erythema and edema in the armpit itself.  Patient also has had shingles in the past, had some left over acyclovir as well as leftover prescription for clindamycin.  She started both, went to urgent care today and was referred to the ED for further evaluation.  No fevers, chills, chest pain, shortness of breath.     Physical Exam   Triage Vital Signs: ED Triage Vitals  Enc Vitals Group     BP 09/27/22 1858 111/86     Pulse Rate 09/27/22 1858 88     Resp 09/27/22 1858 20     Temp 09/27/22 1858 (!) 97.4 F (36.3 C)     Temp src --      SpO2 09/27/22 1858 95 %     Weight 09/27/22 1841 170 lb (77.1 kg)     Height 09/27/22 1841 '5\' 6"'$  (1.676 m)     Head Circumference --      Peak Flow --      Pain Score 09/27/22 1841 10     Pain Loc --      Pain Edu? --      Excl. in [place]? --     Most recent vital signs: Vitals:   09/27/22 1858  BP: 111/86  Pulse: 88  Resp: 20  Temp: (!) 97.4 F (36.3 C)  SpO2: 95%     General: Alert and in no acute distress.  Cardiovascular:  Good peripheral perfusion Respiratory: Normal respiratory effort without tachypnea or retractions. Lungs CTAB.  Musculoskeletal: Full range of motion to all extremities.  Neurologic:  No gross focal neurologic deficits are appreciated.  Skin:   No rash noted.  Visualization of the left axilla reveals some mild erythematous skin  changes consistent with mild cellulitis.  No fluctuance or induration.  No drainage.  Area slightly tender to palpation. Other:   ED Results / Procedures / Treatments   Labs (all labs ordered are listed, but only abnormal results are displayed) Labs Reviewed  CBC  BASIC METABOLIC PANEL     EKG     RADIOLOGY    No results found.  PROCEDURES:  Critical Care performed: No  Procedures   MEDICATIONS ORDERED IN ED: Medications - No data to display   IMPRESSION / MDM / [place] / ED COURSE  I reviewed the triage vital signs and the nursing notes.                                 Differential diagnosis includes, but is not limited to, cellulitis, dermatitis, folliculitis  Patient's presentation is most consistent with acute presentation with potential threat to life or bodily function.   Patient's diagnosis is consistent with cellulitis.  Patient presents emergency department with a rash to the left armpit.  This occurred  after shaving.  Findings appear to be folliculitis versus cellulitis.  No evidence of abscess.  Patient is allergic to multiple antibiotics but can take clindamycin.  As such I will prescribe clindamycin for the patient.  She has taken 1 dose earlier today..  Patient had labs drawn tonight with reassuring results.  No indication for further workup.  No indication for IV antibiotics.  Patient is given ED precautions to return to the ED for any worsening or new symptoms.     FINAL CLINICAL IMPRESSION(S) / ED DIAGNOSES   Final diagnoses:  Cellulitis of left axilla     Rx / DC Orders   ED Discharge Orders          Ordered    clindamycin (CLEOCIN) 300 MG capsule  4 times daily        09/27/22 2121             Note:  This document was prepared using Dragon voice recognition software and may include unintentional dictation errors.   Brynda Peon 09/27/22 2121    Naaman Plummer, MD 10/06/22 [phone]

## 2022-09-27 NOTE — ED Triage Notes (Signed)
Patient reports rash under left axilla. Hot to touch. Reports history of cellulitis

## 2022-09-30 ENCOUNTER — Ambulatory Visit: Payer: Self-pay | Admitting: *Deleted

## 2022-09-30 NOTE — Telephone Encounter (Signed)
  Chief Complaint: rash spreading from left axilla Symptoms: see ED visit from 09/27/22. Cellulitis dx after cutting self with razor shaving left armpit. Rash spreading and area burning. Can't sleep. Unable to perform normal activities.  Frequency: 1 week  Pertinent Negatives: Patient denies fever. Disposition: '[]'$ ED /'[]'$ Urgent Care (no appt availability in office) / '[x]'$ Appointment(In office/virtual)/ '[]'$  [place] [facility]/ '[]'$ Home Care/ '[]'$ Refused Recommended Disposition /'[]'$  Mobile Bus/ '[]'$  Follow-up with PCP Additional Notes:   Appt already scheduled for tomorrow with PCP. Recommended if sx worsen go to ED .     Reason for Disposition  [1] Looks infected (spreading redness, pus) AND [2] no fever  Answer Assessment - Initial Assessment Questions 1. APPEARANCE of RASH: "Describe the rash."      Redness under left axilla "blotches" 2. LOCATION: "Where is the rash located?"      Left armpit area 3. NUMBER: "How many spots are there?"      na 4. SIZE: "How big are the spots?" (Inches, centimeters or compare to size of a coin)      na 5. ONSET: "When did the rash start?"      After cutting skin shaving with razor  6. ITCHING: "Does the rash itch?" If Yes, ask: "How bad is the itch?"  (Scale 0-10; or none, mild, moderate, severe)     Yes  7. PAIN: "Does the rash hurt?" If Yes, ask: "How bad is the pain?"  (Scale 0-10; or none, mild, moderate, severe)    - NONE (0): no pain    - MILD (1-3): doesn't interfere with normal activities     - MODERATE (4-7): interferes with normal activities or awakens from sleep     - SEVERE (8-10): excruciating pain, unable to do any normal activities     Pain to left arm with c/o not able to perform daily tasks 8. OTHER SYMPTOMS: "Do you have any other symptoms?" (e.g., fever)     Can't sleep. Rash seems to be spreading since 1 week ago  9. PREGNANCY: "Is there any chance you are pregnant?" "When was your last menstrual period?"      na  Protocols used: Rash or Redness - Localized-A-AH

## 2022-10-01 ENCOUNTER — Ambulatory Visit (INDEPENDENT_AMBULATORY_CARE_PROVIDER_SITE_OTHER): Payer: Medicaid Other | Admitting: Family Medicine

## 2022-10-01 ENCOUNTER — Ambulatory Visit
Admission: RE | Admit: 2022-10-01 | Discharge: 2022-10-01 | Disposition: A | Discharge status: Home / Self Care | Payer: Medicaid Other | Attending: Family Medicine | Admitting: Family Medicine

## 2022-10-01 ENCOUNTER — Ambulatory Visit
Admission: RE | Admit: 2022-10-01 | Discharge: 2022-10-01 | Disposition: A | Discharge status: Home / Self Care | Payer: Medicaid Other | Source: Ambulatory Visit | Attending: Family Medicine | Admitting: Family Medicine

## 2022-10-01 ENCOUNTER — Encounter: Payer: Self-pay | Admitting: Family Medicine

## 2022-10-01 VITALS — BP 114/76 | HR 75 | Temp 96.6°F | Wt 179.0 lb

## 2022-10-01 DIAGNOSIS — B029 Zoster without complications: Secondary | ICD-10-CM | POA: Diagnosis not present

## 2022-10-01 DIAGNOSIS — L739 Follicular disorder, unspecified: Secondary | ICD-10-CM

## 2022-10-01 DIAGNOSIS — M898X1 Other specified disorders of bone, shoulder: Secondary | ICD-10-CM | POA: Diagnosis not present

## 2022-10-01 DIAGNOSIS — S4992XA Unspecified injury of left shoulder and upper arm, initial encounter: Secondary | ICD-10-CM | POA: Diagnosis not present

## 2022-10-01 MED ORDER — GABAPENTIN 100 MG PO CAPS: 100.0000 mg | ORAL_CAPSULE | Freq: Every day | ORAL | 0 refills | Status: DC
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
## 2022-10-01 NOTE — Patient Instructions (Addendum)
Thank you for coming to the office today.  Finish current Clindamycin just incase cellulitis or folliculitis  May take existing Acyclovir 3 times a day for 7 days or until finished in case this is actually Shingles flare  Ordered Gabapentin '100mg'$  nightly printed, to use if still stinging burning symptoms. Can cause sedation.   Please schedule a Follow-up Appointment to: Return if symptoms worsen or fail to improve.  If you have any other questions or concerns, please feel free to call the office or send a message through [place]. You may also schedule an earlier appointment if necessary.  Additionally, you may be receiving a survey about your experience at our office within a few days to 1 week by e-mail or mail. We value your feedback.  [name], DO [place]

## 2022-10-08 MED ORDER — ERYTHROMYCIN 5 MG/GM OP OINT: 1.0000 | TOPICAL_OINTMENT | Freq: Four times a day (QID) | OPHTHALMIC | 0 refills | Status: DC

## 2022-10-08 NOTE — Addendum Note (Signed)
Addended by: Olin Hauser on: 10/08/2022 06:23 PM   Modules accepted: Orders

## 2022-10-14 ENCOUNTER — Ambulatory Visit (INDEPENDENT_AMBULATORY_CARE_PROVIDER_SITE_OTHER): Payer: Medicaid Other | Admitting: Family Medicine

## 2022-10-14 ENCOUNTER — Encounter: Payer: Self-pay | Admitting: Family Medicine

## 2022-10-14 VITALS — BP 120/76 | HR 79 | Ht 66.0 in | Wt 176.4 lb

## 2022-10-14 DIAGNOSIS — R519 Headache, unspecified: Secondary | ICD-10-CM | POA: Diagnosis not present

## 2022-10-14 DIAGNOSIS — N3011 Interstitial cystitis (chronic) with hematuria: Secondary | ICD-10-CM | POA: Diagnosis not present

## 2022-10-14 DIAGNOSIS — R42 Dizziness and giddiness: Secondary | ICD-10-CM | POA: Diagnosis not present

## 2022-10-14 DIAGNOSIS — N3001 Acute cystitis with hematuria: Secondary | ICD-10-CM

## 2022-10-14 LAB — POCT URINALYSIS DIPSTICK
Bilirubin, UA: NEGATIVE
Glucose, UA: NEGATIVE
Ketones, UA: NEGATIVE
Nitrite, UA: NEGATIVE
Protein, UA: NEGATIVE
Spec Grav, UA: 1.02 (ref 1.010–1.025)
Urobilinogen, UA: 0.2 E.U./dL
pH, UA: 6 (ref 5.0–8.0)

## 2022-10-14 MED ORDER — FOSFOMYCIN TROMETHAMINE 3 G PO PACK: 3.0000 g | PACK | Freq: Once | ORAL | 0 refills | Status: AC

## 2022-10-14 NOTE — Patient Instructions (Addendum)
Thank you for coming to the office today.  Urine looks consistent with infection  Ordered Fosfomycin antibiotic  Ordered Head CT today. They will contact you to schedule this at hospital [facility].  Please discuss with Dr Kasandra Knudsen coming up about possibility of Bipolar Disorder given the lack of sleep and racing thoughts.  Please schedule a Follow-up Appointment to: Return if symptoms worsen or fail to improve.  If you have any other questions or concerns, please feel free to call the office or send a message through [place]. You may also schedule an earlier appointment if necessary.  Additionally, you may be receiving a survey about your experience at our office within a few days to 1 week by e-mail or mail. We value your feedback.  [name], DO [place]
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
## 2022-10-15 LAB — URINE CULTURE
MICRO NUMBER:: 14712536
SPECIMEN QUALITY:: ADEQUATE

## 2022-10-21 ENCOUNTER — Ambulatory Visit
Admission: RE | Admit: 2022-10-21 | Discharge: 2022-10-21 | Disposition: A | Discharge status: Home / Self Care | Payer: Medicaid Other | Source: Ambulatory Visit | Attending: Family Medicine | Admitting: Family Medicine

## 2022-10-21 DIAGNOSIS — R519 Headache, unspecified: Secondary | ICD-10-CM | POA: Diagnosis not present

## 2022-10-21 DIAGNOSIS — R42 Dizziness and giddiness: Secondary | ICD-10-CM | POA: Diagnosis not present

## 2022-11-05 ENCOUNTER — Encounter: Payer: Self-pay | Admitting: Family Medicine

## 2022-11-05 ENCOUNTER — Ambulatory Visit: Payer: Self-pay | Admitting: *Deleted

## 2022-11-05 NOTE — Telephone Encounter (Signed)
Summary: Irritated and red eye, nausea   The patient states her eye is irritated and very red. She has also had some nausea. She states she has been taking care of her son who had upper respiratory symptoms including swollen tonsils. She has been prescribed medication in the past for pink eye if this is what it is. Please assist patient further.

## 2022-11-05 NOTE — Telephone Encounter (Signed)
  Chief Complaint: "Pink eye" Symptoms: Drainage left eye, "Crusted shut this AM." Lid slightly swollen, "Burning" Sclera red Frequency: This AM Pertinent Negatives: Patient denies  Disposition: [] ED /[] Urgent Care (no appt availability in office) / [] Appointment(In office/virtual)/ []  [place] [facility]/ [] Home Care/ [] Refused Recommended Disposition /[] [place] [facility]/ [x]  Follow-up with PCP Additional Notes: Declines appt. States "I get this a lot and usually have drops but they got thrown away." Requesting drops be called in. If appropriate [facility]. Reason for Disposition  [1] Eye with yellow or green discharge, or eyelashes stick together AND [2] NO PCP standing order to call in antibiotic eye drops   (Exception: [place]; continue triage.)  Answer Assessment - Initial Assessment Questions 1. EYE DISCHARGE: "Is the discharge in one or both eyes?" "What color is it?" "How much is there?" "When did the discharge start?"      Left eye, yesterday. 2. REDNESS OF SCLERA: "Is the redness in one or both eyes?" "When did the redness start?"      red 3. EYELIDS: "Are the eyelids red or swollen?" If Yes, ask: "How much?"      Swelling of lid 4. VISION: "Is there any difficulty seeing clearly?"      No 5. PAIN: "Is there any pain? If Yes, ask: "How bad is it?" (Scale 1-10; or mild, moderate, severe)    - MILD (1-3): doesn't interfere with normal activities     - MODERATE (4-7): interferes with normal activities or awakens from sleep    - SEVERE (8-10): excruciating pain, unable to do any normal activities       Burning 6. CONTACT LENS: "Do you wear contacts?"     no 7. OTHER SYMPTOMS: "Do you have any other symptoms?" (e.g., fever, runny nose, cough)     Runny nose "Allergies" Nausea  Protocols used: Eye - Pus or Discharge-A-AH

## 2022-11-06 NOTE — Telephone Encounter (Signed)
Pt has already sent a My Chart message about this.  I have advised her she needs to be seen for her symptoms.    Thanks,   -Vernona Rieger

## 2022-11-14 ENCOUNTER — Encounter: Payer: Self-pay | Admitting: Family Medicine

## 2022-11-14 ENCOUNTER — Telehealth (INDEPENDENT_AMBULATORY_CARE_PROVIDER_SITE_OTHER): Payer: Medicaid Other | Admitting: Family Medicine

## 2022-11-14 DIAGNOSIS — Z808 Family history of malignant neoplasm of other organs or systems: Secondary | ICD-10-CM | POA: Diagnosis not present

## 2022-11-14 DIAGNOSIS — D489 Neoplasm of uncertain behavior, unspecified: Secondary | ICD-10-CM

## 2022-11-14 DIAGNOSIS — D485 Neoplasm of uncertain behavior of skin: Secondary | ICD-10-CM | POA: Diagnosis not present

## 2022-11-14 DIAGNOSIS — N3001 Acute cystitis with hematuria: Secondary | ICD-10-CM | POA: Diagnosis not present

## 2022-11-14 MED ORDER — CLINDAMYCIN HCL 300 MG PO CAPS: 300.0000 mg | ORAL_CAPSULE | Freq: Three times a day (TID) | ORAL | 0 refills | Status: DC

## 2022-11-14 NOTE — Progress Notes (Addendum)
Subjective:    Patient ID: Shirley [name], female    DOB: 1971/02/23, 52 y.o.   MRN: [id number]  Shirley [name] is a 52 y.o. female presenting on 11/14/2022 for Nevus (Pt concern about a crusty mole on left side of head that have increased in size and changed in colors. The pt is requesting a referral to dermatology. )  Patient presents for a same day appointment.  Virtual / Telehealth Encounter - Video Visit via MyChart The purpose of this virtual visit is to provide medical care while limiting exposure to the novel coronavirus (COVID19) for both patient and office staff.  Consent was obtained for remote visit:  Yes.   Answered questions that patient had about telehealth interaction:  Yes.   I discussed the limitations, risks, security and privacy concerns of performing an evaluation and management service by video/telephone. I also discussed with the patient that there may be a patient responsible charge related to this service. The patient expressed understanding and agreed to proceed.  Patient Location: Home Provider Location: [facility] (Office)  Participants in virtual visit: - Patient: Shirley [name] - CMA: Darrol Angel, CMA - Provider: Dr Althea Charon   HPI  Abnormal mole / uncertain behavior Fam history skin cancer melanoma  Pt concern about a crusty mole on left side of head that have increased in size and changed in colors. The pt is requesting a referral to dermatology, she was going to return to Dermatology clinic but soonest available is September. She will need a new referral to get in sooner She admits Scratched mole on forehead and it was scab now. She has fam history of melanoma  UTI Cystitis Recurrent issue Has not returned to Urology She has allergy to all antibiotics except Fosfomycin, last taken 10/14/22 without relief, has tried Clindamycin without problem before, she is requesting trial on this  Behavioral Health Following Dr Janeece Riggers On  medication Only sleeping 3 hours at night      10/14/2022    4:08 PM 07/29/2022    3:30 PM 10/02/2021    2:19 PM  Depression screen PHQ 2/9  Decreased Interest 3 0 0  Down, Depressed, Hopeless 3 1 1   PHQ - 2 Score 6 1 1   Altered sleeping 3 1 1   Tired, decreased energy 3 1 1   Change in appetite 3 3 3   Feeling bad or failure about yourself  3 1 1   Trouble concentrating 0 3 3  Moving slowly or fidgety/restless 3 0 0  Suicidal thoughts 0 0 0  PHQ-9 Score 21 10 10   Difficult doing work/chores Extremely dIfficult Somewhat difficult Not difficult at all    Social History   Tobacco Use   Smoking status: Never    Passive exposure: Never   Smokeless tobacco: Never  Vaping Use   Vaping Use: Never used  Substance Use Topics   Alcohol use: Not Currently   Drug use: Never    Review of Systems Per HPI unless specifically indicated above     Objective:    LMP  (LMP Unknown) Comment: last cycle over a year ago  Wt Readings from Last 3 Encounters:  10/14/22 176 lb 6.4 oz (80 kg)  10/01/22 179 lb (81.2 kg)  09/27/22 170 lb (77.1 kg)    Physical Exam  Note examination was completely remotely via video observation objective data only  Gen - well-appearing, no acute distress or apparent pain, comfortable HEENT - eyes appear clear without discharge or redness Heart/Lungs -  cannot examine virtually - observed no evidence of coughing or labored breathing. Skin - left upper forehead near scalp with 1 cm mole with abnormal appearance, some scab present now from scratch  Neuro - awake, alert, oriented Psych - not anxious appearing   Results for orders placed or performed in visit on 10/14/22  Urine Culture   Specimen: Urine  Result Value Ref Range   MICRO NUMBER: [id number]    SPECIMEN QUALITY: Adequate    Sample Source URINE    STATUS: FINAL    Result:      Less than 10,000 CFU/mL of single Gram positive organism isolated. No further testing will be performed. If clinically  indicated, recollection using a method to minimize contamination, with prompt transfer to Urine Culture Transport Tube, is recommended.  POCT Urinalysis Dipstick  Result Value Ref Range   Color, UA Yellow    Clarity, UA Cloudy    Glucose, UA Negative Negative   Bilirubin, UA Negative    Ketones, UA Negative    Spec Grav, UA 1.020 1.010 - 1.025   Blood, UA Moderate (A)    pH, UA 6.0 5.0 - 8.0   Protein, UA Negative Negative   Urobilinogen, UA 0.2 0.2 or 1.0 E.U./dL   Nitrite, UA Negative    Leukocytes, UA Trace (A) Negative   Appearance     Odor        Assessment & Plan:   Problem List Items Addressed This Visit   None Visit Diagnoses     Acute cystitis with hematuria    -  Primary   Relevant Medications   clindamycin (CLEOCIN) 300 MG capsule   Neoplasm of uncertain behavior          Acute cystitis / vs recurrent cystitis? Difficulty without urinalysis / urine culture at this time virtual visit Prior treatment last month insufficient result Very limited due to extensive antibiotic allergies  Offered Cipro vs repeat Fosfomycin. She is concerned to try cipro due to levaquin allergy. She has tolerated Cipro. I advised her that it is unlikely to be as effective for UTI. She prefers to try this and is very worried about safety and allergy with other meds.  urgent referral to Dermatology due to changing mole left forehead near scalp line, recent reported discoloration change in size, she has fam history of melanoma, requesting consultation sooner, will need urgent referral to be worked in sooner   Orders Placed This Encounter  Procedures   Ambulatory referral to Dermatology    Referral Priority:   Urgent    Referral Type:   Consultation    Referral Reason:   Specialty Services Required    Requested Specialty:   Dermatology    Number of Visits Requested:   1     Meds ordered this encounter  Medications   clindamycin (CLEOCIN) 300 MG capsule    Sig: Take 1 capsule (300  mg total) by mouth 3 (three) times daily. For 7 days    Dispense:  21 capsule    Refill:  0     Follow up plan: No follow-ups on file.   Patient verbalizes understanding with the above medical recommendations including the limitation of remote medical advice.  Specific follow-up and call-back criteria were given for patient to follow-up or seek medical care more urgently if needed.  Total duration of direct patient care provided via video conference: 10 minutes   Saralyn Pilar, DO [facility] 11/14/2022, 4:38 PM

## 2022-11-14 NOTE — Patient Instructions (Signed)
Thank you for coming to the office today.    Please schedule a Follow-up Appointment to: No follow-ups on file.  If you have any other questions or concerns, please feel free to call the office or send a message through MyChart. You may also schedule an earlier appointment if necessary.  Additionally, you may be receiving a survey about your experience at our office within a few days to 1 week by e-mail or mail. We value your feedback.  Chardae Mulkern, DO [facility], [facility] 

## 2022-11-19 DIAGNOSIS — L538 Other specified erythematous conditions: Secondary | ICD-10-CM | POA: Diagnosis not present

## 2022-11-19 DIAGNOSIS — L820 Inflamed seborrheic keratosis: Secondary | ICD-10-CM | POA: Diagnosis not present

## 2022-11-19 DIAGNOSIS — L821 Other seborrheic keratosis: Secondary | ICD-10-CM | POA: Diagnosis not present

## 2022-11-19 DIAGNOSIS — D1801 Hemangioma of skin and subcutaneous tissue: Secondary | ICD-10-CM | POA: Diagnosis not present

## 2022-11-19 DIAGNOSIS — L578 Other skin changes due to chronic exposure to nonionizing radiation: Secondary | ICD-10-CM | POA: Diagnosis not present

## 2022-11-19 DIAGNOSIS — Z808 Family history of malignant neoplasm of other organs or systems: Secondary | ICD-10-CM | POA: Diagnosis not present

## 2022-11-22 ENCOUNTER — Other Ambulatory Visit: Payer: Self-pay | Admitting: Urology

## 2022-12-02 ENCOUNTER — Other Ambulatory Visit: Payer: Self-pay | Admitting: Family Medicine

## 2022-12-02 DIAGNOSIS — B009 Herpesviral infection, unspecified: Secondary | ICD-10-CM

## 2022-12-02 MED ORDER — ACYCLOVIR 400 MG PO TABS: 400.0000 mg | ORAL_TABLET | Freq: Three times a day (TID) | ORAL | 2 refills | Status: DC

## 2022-12-02 NOTE — Telephone Encounter (Signed)
Medication Refill - Medication: acyclovir (ZOVIRAX) 400 MG tablet  Denied on April 27 says, refill not appropiate  Has the patient contacted their pharmacy? yes (Agent: If no, request that the patient contact the pharmacy for the refill. If patient does not wish to contact the pharmacy document the reason why and proceed with request.) (Agent: If yes, when and what did the pharmacy advise?)ycontact pcp   Preferred Pharmacy (with phone number or street name):  [id number] - [place], [place] [address] Phone: [phone]  Fax: [phone]     Has the patient been seen for an appointment in the last year OR does the patient have an upcoming appointment? yes  Agent: Please be advised that RX refills may take up to 3 business days. We ask that you follow-up with your pharmacy.

## 2022-12-02 NOTE — Telephone Encounter (Signed)
Requested medication (s) are due for refill today: yes  Requested medication (s) are on the active medication list: no- historical med  Last refill:  08/26/21  Future visit scheduled: no  Notes to clinic:  historical provider- pt called back to check on refill of med.    Requested Prescriptions  Pending Prescriptions Disp Refills   acyclovir (ZOVIRAX) 400 MG tablet      Sig: Take 1 tablet (400 mg total) by mouth as needed.     Antimicrobials:  Antiviral Agents - Anti-Herpetic Passed - 12/02/2022  2:38 PM      Passed - Valid encounter within last 12 months    Recent Outpatient Visits           2 weeks ago Neoplasm of uncertain behavior   [facility] [place], [name], DO   1 month ago Acute cystitis with hematuria   [place] [facility] [place], [name], DO   2 months ago Folliculitis of left axilla   [place] [facility] Smitty Cords, DO   2 months ago Essential hypertension   [place] [facility] Smitty Cords, DO   3 months ago Dental infection   [place] [facility] [place], [name], [place]

## 2022-12-03 ENCOUNTER — Ambulatory Visit: Payer: Self-pay

## 2022-12-03 NOTE — Telephone Encounter (Signed)
  Chief Complaint: UTI Symptoms: urgency, frequency, burning, back pain, abdominal pain, urine odor, kidney pain Frequency: ongoing since beginning of May Pertinent Negatives: NA Disposition: [] ED /[] Urgent Care (no appt availability in office) / [x] Appointment(In office/virtual)/ []  [facility]/ [] Home Care/ [] Refused Recommended Disposition /[] [facility]/ []  Follow-up with PCP Additional Notes: pt states she has been taking clindamycin on and off since VV on 11/14/22 d/t it causes bad diarrhea but pt thinks she still has UTI or kidney infection based on sx. Also states she had bad panic attack last night and still feels shaky today. She scheduled OV tomorrow at 1020 with PCP. Pt also reports that her BP and HR have been elevated. Advised pt that if she has infection for long period of time without being properly treated can result in sepsis and if pt feeling that bad with all above sx going to ED would be better than OV. Pt states she would try to convence her son to take her to ED but unsure if he would go with her or not since has behavioral/anxiety issues as well. Pt states she will try to come to OV tomorrow and if goes to ED tonight then DR. K would hopefully understand why not. No further assistance noted.   Summary: lower back discomfort and urinary concern   The patient shares that they have recently began to experience stomach and back discomfort  The patient shares they believe the back discomfort is related to a recent prescription  The patient would like to be prescribed something for their lower back discomfort and frequent urination  The patient shares that they have a history of urinary discomfort  Please contact the patient when possible     Reason for Disposition  Side (flank) or lower back pain present  Answer Assessment - Initial Assessment Questions 1. SYMPTOM: "What's the main symptom you're concerned about?" (e.g., frequency, incontinence)      urgency 2. ONSET: "When did the  sx  start?"     Since beginning of May 3. PAIN: "Is there any pain?" If Yes, ask: "How bad is it?" (Scale: 1-10; mild, moderate, severe)     Yes back pain  4. CAUSE: "What do you think is causing the symptoms?"     UTI 5. OTHER SYMPTOMS: "Do you have any other symptoms?" (e.g., blood in urine, fever, flank pain, pain with urination)     Anxiety and panic attack, urine odor, pain and burning with urination, kidney pain  Protocols used: Urinary Symptoms-A-AH

## 2022-12-04 ENCOUNTER — Ambulatory Visit: Payer: Medicaid Other | Admitting: Family Medicine

## 2022-12-04 ENCOUNTER — Encounter: Payer: Self-pay | Admitting: Family Medicine

## 2022-12-04 VITALS — BP 112/62 | HR 85 | Ht 66.0 in | Wt 178.0 lb

## 2022-12-04 DIAGNOSIS — F5104 Psychophysiologic insomnia: Secondary | ICD-10-CM

## 2022-12-04 DIAGNOSIS — F4310 Post-traumatic stress disorder, unspecified: Secondary | ICD-10-CM

## 2022-12-04 DIAGNOSIS — N3010 Interstitial cystitis (chronic) without hematuria: Secondary | ICD-10-CM | POA: Diagnosis not present

## 2022-12-04 DIAGNOSIS — R350 Frequency of micturition: Secondary | ICD-10-CM

## 2022-12-04 LAB — POCT URINALYSIS DIPSTICK
Bilirubin, UA: NEGATIVE
Glucose, UA: NEGATIVE
Ketones, UA: NEGATIVE
Leukocytes, UA: NEGATIVE
Nitrite, UA: NEGATIVE
Protein, UA: NEGATIVE
Spec Grav, UA: 1.025 (ref 1.010–1.025)
Urobilinogen, UA: 0.2 E.U./dL
pH, UA: 6 (ref 5.0–8.0)

## 2022-12-04 MED ORDER — FOSFOMYCIN TROMETHAMINE 3 G PO PACK: 3.0000 g | PACK | Freq: Once | ORAL | 0 refills | Status: AC

## 2022-12-04 MED ORDER — AMITRIPTYLINE HCL 50 MG PO TABS: 50.0000 mg | ORAL_TABLET | Freq: Every day | ORAL | 2 refills | Status: DC

## 2022-12-04 NOTE — Progress Notes (Signed)
Subjective:    Patient ID: Shirley [name], female    DOB: 04-04-1971, 52 y.o.   MRN: [id number]  Shirley [name] is a 52 y.o. female presenting on 12/04/2022 for Urinary Frequency   HPI  UTI Cystitis Recurrent issue Has not returned to Urology for past 1 year. They diagnosed cystitis before and advised unlikely bacterial, trial on Amitriptyline but she did not pursue this.  She has allergy to all antibiotics except Fosfomycin Recently took the Clindamycin. Thought to be causing diarrhea  Past history of bladder dysfunction. With pregnancy in past, she had to wear foley catheter for months after she had complications from delivery.  Prior CT Renal Stone Study 10/2020 with small bladder stone only.   PTSD Insomnia Behavioral Health Following Dr Janeece Riggers They are adjusting Xanax, she has been on a long time She did not want to take Trazodone Only sleeping 3 hours at night      12/04/2022   12:41 PM 10/14/2022    4:08 PM 07/29/2022    3:30 PM  Depression screen PHQ 2/9  Decreased Interest 3 3 0  Down, Depressed, Hopeless 2 3 1   PHQ - 2 Score 5 6 1   Altered sleeping 3 3 1   Tired, decreased energy 3 3 1   Change in appetite 2 3 3   Feeling bad or failure about yourself  3 3 1   Trouble concentrating 1 0 3  Moving slowly or fidgety/restless 3 3 0  Suicidal thoughts 0 0 0  PHQ-9 Score 20 21 10   Difficult doing work/chores Somewhat difficult Extremely dIfficult Somewhat difficult       Social History   Tobacco Use   Smoking status: Never    Passive exposure: Never   Smokeless tobacco: Never  Vaping Use   Vaping Use: Never used  Substance Use Topics   Alcohol use: Not Currently   Drug use: Never    Review of Systems Per HPI unless specifically indicated above     Objective:    BP 112/62   Pulse 85   Ht 5\' 6"  (1.676 m)   Wt 178 lb (80.7 kg)   LMP  (LMP Unknown) Comment: last cycle over a year ago  SpO2 98%   BMI 28.73 kg/m   Wt Readings from Last 3 Encounters:   12/04/22 178 lb (80.7 kg)  10/14/22 176 lb 6.4 oz (80 kg)  10/01/22 179 lb (81.2 kg)    Physical Exam Vitals and nursing note reviewed.  Constitutional:      General: She is not in acute distress.    Appearance: Normal appearance. She is well-developed. She is not diaphoretic.     Comments: Well-appearing, comfortable, cooperative  HENT:     Head: Normocephalic and atraumatic.  Eyes:     General:        Right eye: No discharge.        Left eye: No discharge.     Conjunctiva/sclera: Conjunctivae normal.  Cardiovascular:     Rate and Rhythm: Normal rate.  Pulmonary:     Effort: Pulmonary effort is normal.  Skin:    General: Skin is warm and dry.     Findings: No erythema or rash.  Neurological:     Mental Status: She is alert and oriented to person, place, and time.  Psychiatric:        Mood and Affect: Mood normal.        Behavior: Behavior normal.        Thought Content: Thought  content normal.     Comments: Well groomed, good eye contact, normal speech and thoughts     I have personally reviewed the radiology report from 10/2021 CT RENAL  CLINICAL DATA:  Lower back pain x3 weeks history of frequent UTIs   EXAM: CT ABDOMEN AND PELVIS WITHOUT CONTRAST   TECHNIQUE: Multidetector CT imaging of the abdomen and pelvis was performed following the standard protocol without IV contrast.   COMPARISON:  CT June 04, 2017   FINDINGS: Lower chest: No acute abnormality.   Hepatobiliary: Unremarkable noncontrast appearance of the hepatic parenchyma gallbladder is decompressed otherwise unremarkable. No biliary ductal dilation.   Pancreas: Within normal limits.   Spleen: Within normal limits.   Adrenals/Urinary Tract: Bilateral adrenal glands are unremarkable. No hydronephrosis. No contour deforming renal masses. No renal or ureteral calculi visualized. 2 mm stone in the bladder on image 72/5.   Stomach/Bowel: Stomach is within normal limits. Appendix is  not definitely visualized however there is no pericecal inflammation. No evidence of bowel wall thickening, distention, or inflammatory changes.   Vascular/Lymphatic: No significant vascular findings are present. No enlarged abdominal or pelvic lymph nodes.   Reproductive: Right ovarian cyst measuring 5.7 cm, similar in size to pelvic ultrasound September 12, 2020. Left ovary is unremarkable.   Other: No abdominopelvic ascites.   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 No acute osseous abnormality.   IMPRESSION: 1. 2 mm stone in the bladder. No hydronephrosis. No renal or ureteral calculi visualized 2. Right ovarian cyst measuring 5.7 cm, similar in size to pelvic ultrasound September 12, 2020. Follow-up by [place] is recommended in 3-6 months. Note: This recommendation does not apply to premenarchal patients and to those with increased risk (genetic, family history, elevated tumor markers or other high-risk factors) of ovarian cancer. Reference: JACR 2020 Feb; 17(2):248-254     Electronically Signed   By: Maudry Mayhew MD   On: 11/20/2020 18:23  Results for orders placed or performed in visit on 12/04/22  POCT urinalysis dipstick  Result Value Ref Range   Color, UA yellow    Clarity, UA     Glucose, UA Negative Negative   Bilirubin, UA negative    Ketones, UA negative    Spec Grav, UA 1.025 1.010 - 1.025   Blood, UA large    pH, UA 6.0 5.0 - 8.0   Protein, UA Negative Negative   Urobilinogen, UA 0.2 0.2 or 1.0 E.U./dL   Nitrite, UA neg    Leukocytes, UA Negative Negative   Appearance     Odor        Assessment & Plan:   Problem List Items Addressed This Visit     Psychophysiological insomnia   PTSD (post-traumatic stress disorder)   Relevant Medications   amitriptyline (ELAVIL) 50 MG tablet   Other Visit Diagnoses     Chronic interstitial cystitis    -   Primary   Relevant Medications   fosfomycin (MONUROL) 3 g PACK   amitriptyline (ELAVIL) 50 MG tablet   Urinary frequency       Relevant Orders   POCT urinalysis dipstick (Completed)      PTSD Insomnia Keep up with [facility] Psychiatry Continue with Alprazolam as they are managing this, we discussed dosing. She has been long term BDZ therapy They have offered Trazodone for insomnia. I advised she should pursue additional therapy either Trazodone or Amitriptyline as we discussed. She opts  to hold Trazodone and take the Amitriptyline now for bladder cystitis  Cystitis, chronic Previously followed by urology, has not returned in 1 year. They advised not infection but cystitis and started Amitriptyline 50mg  nightly but she was not adherent to it  If this does not work, I would suggest switching to Trazodone in future for insomnia.  Will agree to re order Fosfomycin if she needs if worsening or new concerns.  Route chart to Dr Lonna Cobb BUA for review and re-scheduling.   Meds ordered this encounter  Medications   fosfomycin (MONUROL) 3 g PACK    Sig: Take 3 g by mouth once for 1 dose.    Dispense:  3 g    Refill:  0   amitriptyline (ELAVIL) 50 MG tablet    Sig: Take 1 tablet (50 mg total) by mouth at bedtime.    Dispense:  30 tablet    Refill:  2     Follow up plan: Return in about 4 months (around 04/06/2023) for 4 month Annual Physical AM apt fasting lab AFTER.   Saralyn Pilar, DO [facility] 12/04/2022, 10:34 AM

## 2022-12-04 NOTE — Patient Instructions (Addendum)
Thank you for coming to the office today.  Keep up with [facility] Continue with Alprazolam as they are managing  ADD Amitriptyline 50mg  nightly for sleep and cystitis. It may take several weeks for it to take effect.  If this does not work, I would suggest switching to Trazodone in future.  Re order the Fosfomycin course if you need it.  I will try to link you back up with Dr Lonna Cobb  DUE for FASTING BLOOD WORK (no food or drink after midnight before the lab appointment, only water or coffee without cream/sugar on the morning of)  Next visit after visit.   Please schedule a Follow-up Appointment to: Return in about 4 months (around 04/06/2023) for 4 month Annual Physical AM apt fasting lab AFTER.  If you have any other questions or concerns, please feel free to call the office or send a message through MyChart. You may also schedule an earlier appointment if necessary.  Additionally, you may be receiving a survey about your experience at our office within a few days to 1 week by e-mail or mail. We value your feedback.  Shirley Pilar, DO [facility] [place], [place]

## 2022-12-05 ENCOUNTER — Telehealth: Payer: Self-pay | Admitting: Family Medicine

## 2022-12-05 NOTE — Telephone Encounter (Signed)
-----   Message from Riki Altes, MD sent at 12/05/2022  2:53 PM EDT ----- Please schedule follow-up appointment with Carollee Herter.  (Put in scheduling notes "per PCP interested in starting a trial of amitriptyline") ----- Message ----- From: Smitty Cords, DO Sent: 12/04/2022  12:51 PM EDT To: Riki Altes, MD  Dr Lonna Cobb, I am routing this chart of mutual patient to you for review. She has been challenging to treat with frequent / recurrent acute cystitis concerns. From chart review, it looks like 10/2021 she was advised by your office unlikely to be bacterial and trial of amitriptyline. She has not been on this therapy. She has extensive antibiotic allergies. I am trying to work with her and her Psychiatry. She is now willing to try the Amitriptyline. History sounds like she has some underlying chronic bladder dysfunction regardless. Would you be able to have your office contact/re-schedule her, so she can follow up with you? Thank you!

## 2022-12-05 NOTE — Telephone Encounter (Signed)
Shirley Altes, MD sent to Shirley [name], CMA Please schedule follow-up appointment with Shirley [name].  (Put in scheduling notes "per PCP interested in starting a trial of amitriptyline") LMOM for patient to return call.

## 2022-12-05 NOTE — Telephone Encounter (Signed)
Spoke with patient and advised she does not need a referral she is already a patient. Pt states her PCP wanted her to see stoioff because he was her previous urologist. I tried explaining to pt that we can schedule her with sninsky in [place] this month. Pt states she will call back.  Per pt she saw stoioff in 2005   [facility] with stoioff and pt needs to schedule with sninsky)

## 2022-12-10 ENCOUNTER — Telehealth (INDEPENDENT_AMBULATORY_CARE_PROVIDER_SITE_OTHER): Payer: Medicaid Other | Admitting: Family Medicine

## 2022-12-10 ENCOUNTER — Ambulatory Visit: Payer: Self-pay | Admitting: *Deleted

## 2022-12-10 ENCOUNTER — Encounter: Payer: Self-pay | Admitting: Family Medicine

## 2022-12-10 DIAGNOSIS — H1032 Unspecified acute conjunctivitis, left eye: Secondary | ICD-10-CM | POA: Diagnosis not present

## 2022-12-10 MED ORDER — ERYTHROMYCIN 5 MG/GM OP OINT: TOPICAL_OINTMENT | OPHTHALMIC | 1 refills | Status: DC

## 2022-12-10 NOTE — Telephone Encounter (Signed)
  Chief Complaint: possible pink eye Symptoms: left eye red, burning, clear discharge Frequency: 2 days Pertinent Negatives: Patient denies fever, cough, vomiting Disposition: [] ED /[] Urgent Care (no appt availability in office) / [x] Appointment(In office/virtual)/ []  [place] [facility]/ [] Home Care/ [] Refused Recommended Disposition /[] [place] [facility]/ []  Follow-up with PCP Additional Notes: Patient thinks she has reinfected her eye by using old ointment in the eye- appointment scheduled- patient states she is in process of moving and can not come to office- VV scheduled.

## 2022-12-10 NOTE — Patient Instructions (Addendum)
Ordered Erythromycin eye ointment for you again, with +1 refill if you need new tube in future.  Please call back to Urology and schedule w/ Dr Richardo Hanks in [place].   Please schedule a Follow-up Appointment to: Return if symptoms worsen or fail to improve.  If you have any other questions or concerns, please feel free to call the office or send a message through MyChart. You may also schedule an earlier appointment if necessary.  Additionally, you may be receiving a survey about your experience at our office within a few days to 1 week by e-mail or mail. We value your feedback.  Saralyn Pilar, DO [facility], [place]

## 2022-12-10 NOTE — Telephone Encounter (Signed)
Message from Cory Munch sent at 12/10/2022 10:48 AM EDT  Summary: [name]   Pink Eye         Reason for Disposition  [1] Only 1 eye is red AND [2] present > 48 hours  Answer Assessment - Initial Assessment Questions 1. LOCATION: Location: "What's red, the eyeball or the outer eyelids?" (Note: when callers say the eye is red, they usually mean the sclera is red)       Left eye- redness in sclera 2. REDNESS OF SCLERA: "Is the redness in one or both eyes?" "When did the redness start?"      Left eye 3. ONSET: "When did the eye become red?" (e.g., hours, days)      2 days 4. EYELIDS: "Are the eyelids red or swollen?" If Yes, ask: "How much?"      yes 5. VISION: "Is there any difficulty seeing clearly?"      Not on left 6. ITCHING: "Does it feel itchy?" If so ask: "How bad is it" (e.g., Scale 1-10; or mild, moderate, severe)     no 7. PAIN: "Is there any pain? If Yes, ask: "How bad is it?" (e.g., Scale 1-10; or mild, moderate, severe)     Burning with eye drops 8. CONTACT LENS: "Do you wear contacts?"     no 9. CAUSE: "What do you think is causing the redness?"     Reinfection, stress 10. OTHER SYMPTOMS: "Do you have any other symptoms?" (e.g., fever, runny nose, cough, vomiting)       Diarrhea, cold symptoms  Protocols used: Eye - Red Without Pus-A-AH

## 2022-12-10 NOTE — Progress Notes (Signed)
Subjective:    Patient ID: Shirley [name], female    DOB: [birth year], 52 y.o.   MRN: [id number]  Shirley [name] is a 52 y.o. female presenting on 12/10/2022 for Eye Problem  Virtual / Telehealth Encounter - Video Visit via MyChart The purpose of this virtual visit is to provide medical care while limiting exposure to the novel coronavirus (COVID19) for both patient and office staff.  Consent was obtained for remote visit:  Yes.   Answered questions that patient had about telehealth interaction:  Yes.   I discussed the limitations, risks, security and privacy concerns of performing an evaluation and management service by video/telephone. I also discussed with the patient that there may be a patient responsible charge related to this service. The patient expressed understanding and agreed to proceed.  Patient Location: Home Provider Location: [facility] (Office)  Participants in virtual visit: - Patient: Shirley [name] - CMA: Darrol Angel CMA - Provider: Dr Althea Charon   HPI  Conjunctivitis acute L eye History of stomach bug Diarrhea recently Monurol helped the UTI, better than Clindamycin She will call to schedule Dr Tomie China [place] for chronic cystitis Used topical erythromycin eye ointment leftover and she says used same makeup and maybe reinfected Admits eye irritation Tried OTC drops Has not seen Eye doctor in years No sick contact Admits eye drainage Denies URI symptoms fever       12/04/2022   12:41 PM 10/14/2022    4:08 PM 07/29/2022    3:30 PM  Depression screen PHQ 2/9  Decreased Interest 3 3 0  Down, Depressed, Hopeless 2 3 1   PHQ - 2 Score 5 6 1   Altered sleeping 3 3 1   Tired, decreased energy 3 3 1   Change in appetite 2 3 3   Feeling bad or failure about yourself  3 3 1   Trouble concentrating 1 0 3  Moving slowly or fidgety/restless 3 3 0  Suicidal thoughts 0 0 0  PHQ-9 Score 20 21 10   Difficult doing work/chores Somewhat difficult  Extremely dIfficult Somewhat difficult    Social History   Tobacco Use   Smoking status: Never    Passive exposure: Never   Smokeless tobacco: Never  Vaping Use   Vaping Use: Never used  Substance Use Topics   Alcohol use: Not Currently   Drug use: Never    Review of Systems Per HPI unless specifically indicated above     Objective:    LMP  (LMP Unknown) Comment: last cycle over a year ago  Wt Readings from Last 3 Encounters:  12/04/22 178 lb (80.7 kg)  10/14/22 176 lb 6.4 oz (80 kg)  10/01/22 179 lb (81.2 kg)    Physical Exam  Note examination was completely remotely via video observation objective data only  Gen - well-appearing, no acute distress or apparent pain, comfortable HEENT - LEFT Eye conjunctival injection mild Heart/Lungs - cannot examine virtually - observed no evidence of coughing or labored breathing. Abd - cannot examine virtually  Skin - face visible today- no rash Neuro - awake, alert, oriented Psych - not anxious appearing   Results for orders placed or performed in visit on 12/04/22  POCT urinalysis dipstick  Result Value Ref Range   Color, UA yellow    Clarity, UA     Glucose, UA Negative Negative   Bilirubin, UA negative    Ketones, UA negative    Spec Grav, UA 1.025 1.010 - 1.025   Blood, UA large  pH, UA 6.0 5.0 - 8.0   Protein, UA Negative Negative   Urobilinogen, UA 0.2 0.2 or 1.0 E.U./dL   Nitrite, UA neg    Leukocytes, UA Negative Negative   Appearance     Odor        Assessment & Plan:   Problem List Items Addressed This Visit   None Visit Diagnoses     Acute conjunctivitis of left eye, unspecified acute conjunctivitis type    -  Primary   Relevant Medications   erythromycin ophthalmic ointment       Re order Erythryomycin eye ointment as prescribed, add 1 refill for future  Recommend eye doctor routine evaluation if recurrent problem.  Meds ordered this encounter  Medications   erythromycin ophthalmic  ointment    Sig: Place 1 Application into both eyes 4 (four) times daily. For 7-10 day    Dispense:  3.5 g    Refill:  1      Follow up plan: Return if symptoms worsen or fail to improve.   Patient verbalizes understanding with the above medical recommendations including the limitation of remote medical advice.  Specific follow-up and call-back criteria were given for patient to follow-up or seek medical care more urgently if needed.  Total duration of direct patient care provided via video conference: 10 minutes   Saralyn Pilar, DO [facility] 12/10/2022, 4:24 PM

## 2022-12-18 DIAGNOSIS — L820 Inflamed seborrheic keratosis: Secondary | ICD-10-CM | POA: Diagnosis not present

## 2022-12-23 IMAGING — CR DG CHEST 2V
2 series · 2 of 2 positions shown · non-contrast
Comparison: PA Lat 01/31/2020

CLINICAL DATA: Chest pain and shortness of breath.

EXAM:
CHEST - 2 VIEW

[chest lat]
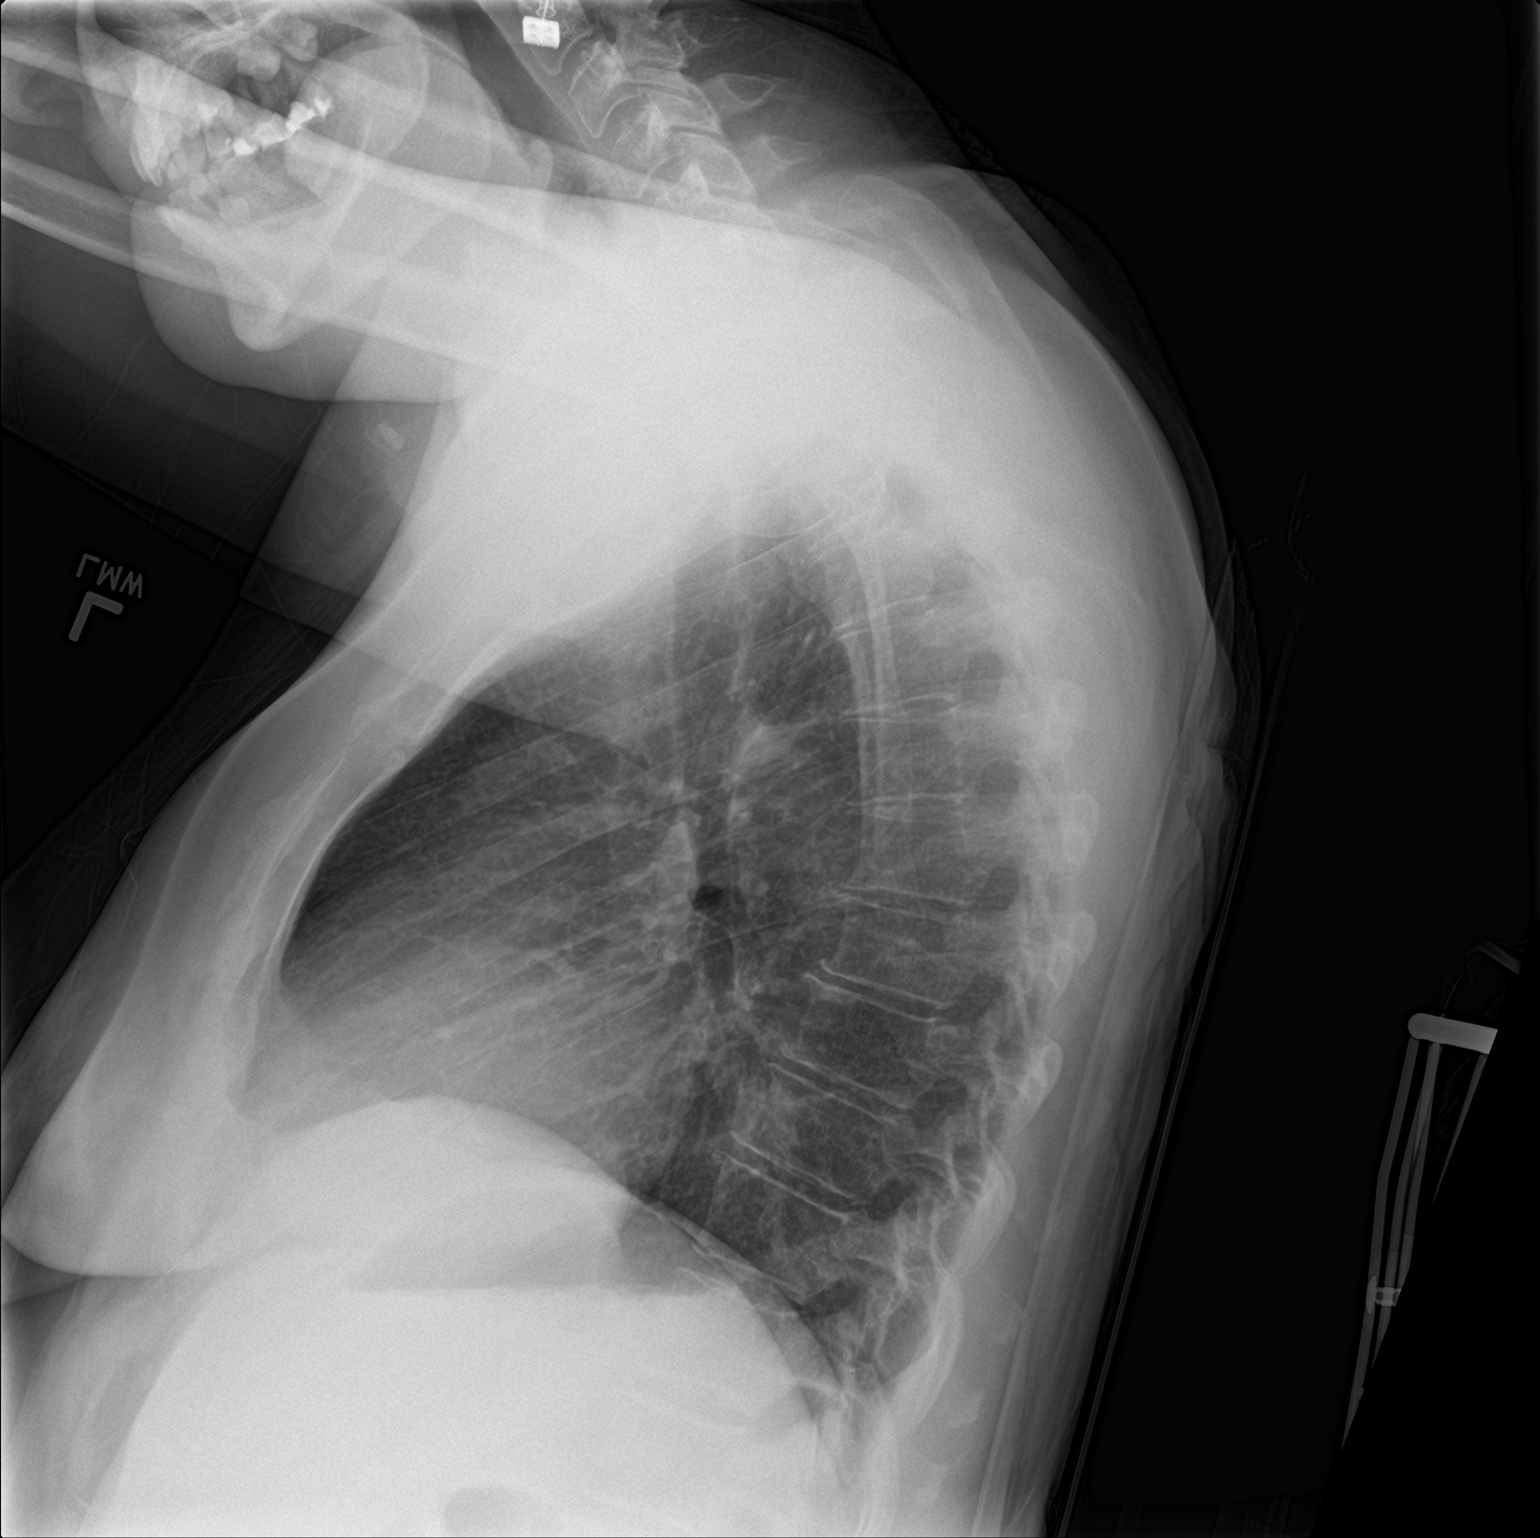

[chest ap]
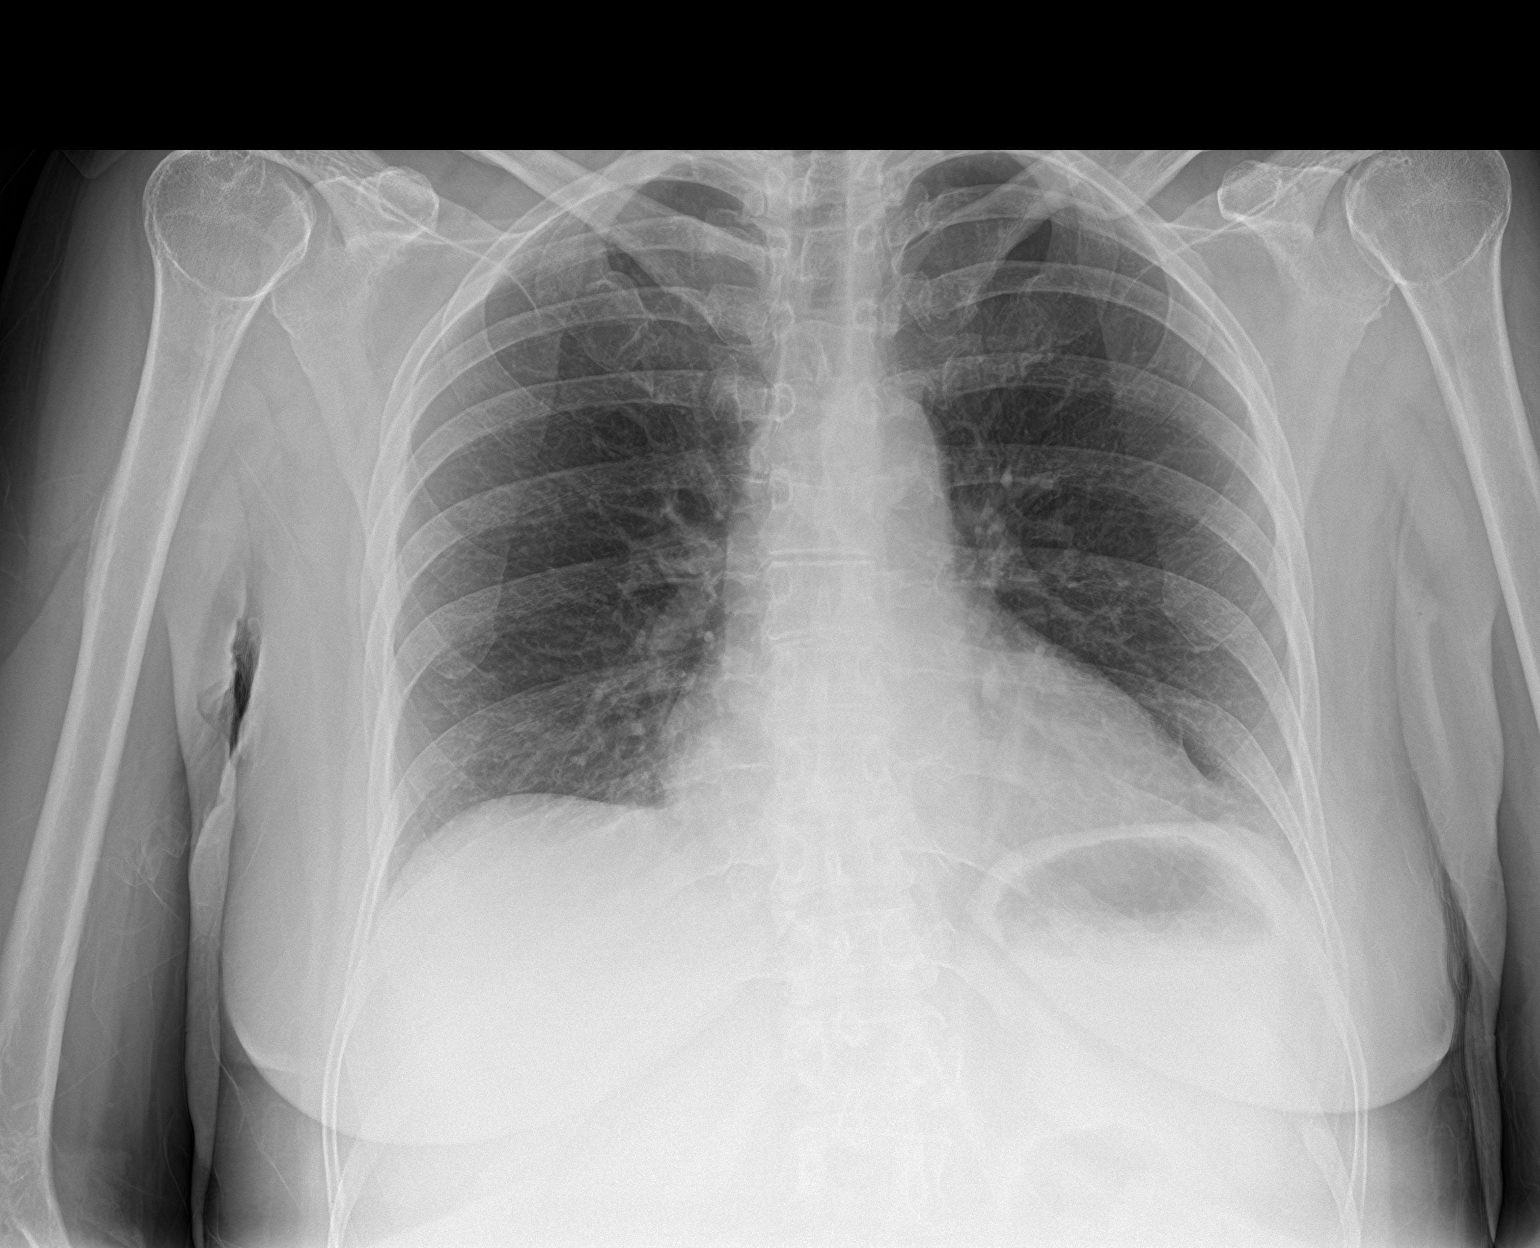

[2 of 2 positions shown; findings below may reference images not displayed]

FINDINGS: The heart size and mediastinal contours are within normal limits.
Both lungs are clear. The visualized skeletal structures are
unremarkable apart from a chronic healed fracture deformity of the
mid shaft left clavicle.
IMPRESSION: No evidence of acute chest disease or interval changes.

## 2023-01-01 ENCOUNTER — Other Ambulatory Visit: Payer: Self-pay | Admitting: Family Medicine

## 2023-01-01 DIAGNOSIS — N3010 Interstitial cystitis (chronic) without hematuria: Secondary | ICD-10-CM

## 2023-01-01 NOTE — Telephone Encounter (Signed)
Unable to refill per protocol, Rx request is too soon. Last refill 12/04/22 for 30 and 2 refills.  Requested Prescriptions  Pending Prescriptions Disp Refills   amitriptyline (ELAVIL) 50 MG tablet [Pharmacy Med Name: AMITRIPTYLINE HCL 50 MG TAB] 90 tablet 1    Sig: TAKE 1 TABLET BY MOUTH EVERYDAY AT BEDTIME     Psychiatry:  Antidepressants - Heterocyclics (TCAs) Passed - 01/01/2023  9:32 AM      Passed - Completed PHQ-2 or PHQ-9 in the last 360 days      Passed - Valid encounter within last 6 months    Recent Outpatient Visits           3 weeks ago Acute conjunctivitis of left eye, unspecified acute conjunctivitis type   [place] [facility] [place], [name], DO   4 weeks ago Chronic interstitial cystitis   [place] Smitty Cords, DO   1 month ago Neoplasm of uncertain behavior   [place] [facility] Smitty Cords, DO   2 months ago Acute cystitis with hematuria   [place] [facility] [place], [name], DO   3 months ago Folliculitis of left axilla   [place] [facility] [place], [name], DO       Future Appointments             In 3 months Shirley [name], [name], DO [place] [facility], [facility]

## 2023-01-02 ENCOUNTER — Other Ambulatory Visit: Payer: Self-pay | Admitting: Family Medicine

## 2023-01-02 DIAGNOSIS — N3010 Interstitial cystitis (chronic) without hematuria: Secondary | ICD-10-CM

## 2023-01-02 NOTE — Telephone Encounter (Signed)
Requested medications are due for refill today.  unsure  Requested medications are on the active medications list.  yes  Last refill. 12/04/2022  Future visit scheduled.   yes  Notes to clinic.  Medication is listed as historical.    Requested Prescriptions  Pending Prescriptions Disp Refills   fosfomycin (MONUROL) 3 g PACK [Pharmacy Med Name: FOSFOMYCIN 3 GM SACHET]      Sig: TAKE 3 G BY MOUTH ONCE FOR 1 DOSE.     Off-Protocol Failed - 01/02/2023  3:45 PM      Failed - Medication not assigned to a protocol, review manually.      Passed - Valid encounter within last 12 months    Recent Outpatient Visits           3 weeks ago Acute conjunctivitis of left eye, unspecified acute conjunctivitis type   [place] [facility] [place], [name], DO   4 weeks ago Chronic interstitial cystitis   [place] [facility] Smitty Cords, DO   1 month ago Neoplasm of uncertain behavior   [place] [facility] Smitty Cords, DO   2 months ago Acute cystitis with hematuria   [place] [facility] [place], [name], DO   3 months ago Folliculitis of left axilla   [place] [facility] Althea Charon, [name], DO       Future Appointments             In 3 months Althea Charon, [name], DO [place] [facility], [facility]

## 2023-01-19 ENCOUNTER — Ambulatory Visit: Payer: Self-pay

## 2023-01-19 ENCOUNTER — Emergency Department
Admission: EM | Admit: 2023-01-19 | Discharge: 2023-01-19 | Discharge status: Against Medical Advice | Payer: Medicaid Other | Attending: Emergency Medicine | Admitting: Emergency Medicine

## 2023-01-19 DIAGNOSIS — R110 Nausea: Secondary | ICD-10-CM | POA: Diagnosis not present

## 2023-01-19 DIAGNOSIS — R197 Diarrhea, unspecified: Secondary | ICD-10-CM | POA: Insufficient documentation

## 2023-01-19 DIAGNOSIS — R1011 Right upper quadrant pain: Secondary | ICD-10-CM | POA: Insufficient documentation

## 2023-01-19 DIAGNOSIS — Z5321 Procedure and treatment not carried out due to patient leaving prior to being seen by health care provider: Secondary | ICD-10-CM | POA: Insufficient documentation

## 2023-01-19 LAB — CBG MONITORING, ED: Glucose-Capillary: 92 mg/dL (ref 70–99)

## 2023-01-19 NOTE — ED Triage Notes (Signed)
Patient presents with RUQ pain, nausea and diarrhea that started earlier today; Patient then states that she has chronic diarrhea from stress but this diarrhea is different; Refusing labs in triage

## 2023-01-19 NOTE — ED Notes (Signed)
Pt called multiple times and is no where to be found. Pt did states about going to [facility] earlier

## 2023-01-19 NOTE — Telephone Encounter (Signed)
Will review ED note once available. 

## 2023-01-19 NOTE — Telephone Encounter (Signed)
  Chief Complaint: Lower right side abdominal pain, diarrhea 8x today, nearly passed out in [employer] Symptoms: above Frequency: last night into today Pertinent Negatives: Patient denies  Disposition: [x] ED /[] Urgent Care (no appt availability in office) / [] Appointment(In office/virtual)/ []  [place] Virtual [facility]/ [] Home Care/ [] Refused Recommended Disposition /[] [place] [facility]/ []  Follow-up with PCP Additional Notes: Pt states that she started feeling a bit off last night. Today right lower abdominal pain that radiates to leg.Diarrhea 8x and nearly passed out. Pt is on her way to ED.    Summary: dizzy, stomach pain   Patient called stated she almost fainted in [place], she has had constant diarrhea, dizzy and lower right pain in her stomach. She has an appt and said she was going to the ER but she wanted the nurse to call anyway.     Reason for Disposition  [1] MILD-MODERATE pain AND [2] constant AND [3] present > 2 hours  Answer Assessment - Initial Assessment Questions 1. LOCATION: "Where does it hurt?"      Right side under rib cage 2. RADIATION: "Does the pain shoot anywhere else?" (e.g., chest, back)     Down her leg 3. ONSET: "When did the pain begin?" (e.g., minutes, hours or days ago)      Yesterday evening 4. SUDDEN: "Gradual or sudden onset?"     Gradual 6. SEVERITY: "How bad is the pain?"  (e.g., Scale 1-10; mild, moderate, or severe)    - MILD (1-3): Doesn't interfere with normal activities, abdomen soft and not tender to touch.     - MODERATE (4-7): Interferes with normal activities or awakens from sleep, abdomen tender to touch.     - SEVERE (8-10): Excruciating pain, doubled over, unable to do any normal activities.       Moderate 7. RECURRENT SYMPTOM: "Have you ever had this type of stomach pain before?" If Yes, ask: "When was the last time?" and "What happened that time?"      no 10. OTHER SYMPTOMS: "Do you have any other symptoms?" (e.g., back pain,  diarrhea, fever, urination pain, vomiting)       Diarrhea x 8 brown water, neatly passed out in [employer]  Protocols used: Abdominal Pain - [facility]

## 2023-01-21 ENCOUNTER — Encounter: Payer: Self-pay | Admitting: Family Medicine

## 2023-01-21 ENCOUNTER — Telehealth (INDEPENDENT_AMBULATORY_CARE_PROVIDER_SITE_OTHER): Payer: Medicaid Other | Admitting: Family Medicine

## 2023-01-21 DIAGNOSIS — F4310 Post-traumatic stress disorder, unspecified: Secondary | ICD-10-CM | POA: Diagnosis not present

## 2023-01-21 DIAGNOSIS — R197 Diarrhea, unspecified: Secondary | ICD-10-CM | POA: Diagnosis not present

## 2023-01-21 DIAGNOSIS — F5104 Psychophysiologic insomnia: Secondary | ICD-10-CM

## 2023-01-21 NOTE — Progress Notes (Signed)
Subjective:    Patient ID: Shirley [name], female    DOB: 10-Sep-1970, 52 y.o.   MRN: [id number]  CAMILLE DRAGAN is a 52 y.o. female presenting on 01/21/2023 for Diarrhea (Intermittent diarrhea x 1 yr ago. Pt reports that she has white color stools and then dark, bloody stools. ), Allergic Reaction (Pt reports that since moving in her new home her allergies has got worse. Watery eyes, puffy eyes and lack of energy ), and Insomnia (Pt reports she has not been able to sleep over the past few days because of the abdominal pain, diarrhea and anxiety. Pt also complains of several panic attacks after her sister passed away 01/14/23 )  Patient presents for a same day appointment.  Virtual / Telehealth Encounter - Video Visit via MyChart The purpose of this virtual visit is to provide medical care while limiting exposure to the novel coronavirus (COVID19) for both patient and office staff.  Consent was obtained for remote visit:  Yes.   Answered questions that patient had about telehealth interaction:  Yes.   I discussed the limitations, risks, security and privacy concerns of performing an evaluation and management service by video/telephone. I also discussed with the patient that there may be a patient responsible charge related to this service. The patient expressed understanding and agreed to proceed.  Patient Location: Home Provider Location: [facility] (Office)  Participants in virtual visit: - Patient: Shirley [name] - CMA: Laurel Dimmer CMA - Provider: Dr Althea Charon   HPI  Diarrhea Episodic Reports worse from digestion and anxiety with panic attacks Episodic diarrhea frequency, changes in stool color Admits episodes of hoarse voice when wake up due to stomach acid reflux Taking Imodium AD every other week  She relocated housing now.  PTSD Insomnia Depression / Anxiety Behavioral Health Following Dr Janeece Riggers  On Alprazolam 1mg  THREE TIMES A DAY AS NEEDED  They are  adjusting Xanax, she has been on a long time She did not want to take Trazodone Only sleeping 3 hours at night       12/04/2022   12:41 PM 10/14/2022    4:08 PM 07/29/2022    3:30 PM  Depression screen PHQ 2/9  Decreased Interest 3 3 0  Down, Depressed, Hopeless 2 3 1   PHQ - 2 Score 5 6 1   Altered sleeping 3 3 1   Tired, decreased energy 3 3 1   Change in appetite 2 3 3   Feeling bad or failure about yourself  3 3 1   Trouble concentrating 1 0 3  Moving slowly or fidgety/restless 3 3 0  Suicidal thoughts 0 0 0  PHQ-9 Score 20 21 10   Difficult doing work/chores Somewhat difficult Extremely dIfficult Somewhat difficult    Social History   Tobacco Use   Smoking status: Never    Passive exposure: Never   Smokeless tobacco: Never  Vaping Use   Vaping Use: Never used  Substance Use Topics   Alcohol use: Not Currently   Drug use: Never    Review of Systems Per HPI unless specifically indicated above     Objective:    LMP  (LMP Unknown) Comment: last cycle over a year ago  Wt Readings from Last 3 Encounters:  12/04/22 178 lb (80.7 kg)  10/14/22 176 lb 6.4 oz (80 kg)  10/01/22 179 lb (81.2 kg)    Physical Exam  Note examination was completely remotely via video observation objective data only  Gen - well-appearing, no acute distress or apparent  pain, comfortable HEENT - eyes appear clear without discharge or redness Heart/Lungs - cannot examine virtually - observed no evidence of coughing or labored breathing. Abd - cannot examine virtually  Skin - face visible today- no rash Neuro - awake, alert, oriented Psych - anxious   Results for orders placed or performed in visit on 12/04/22  POCT urinalysis dipstick  Result Value Ref Range   Color, UA yellow    Clarity, UA     Glucose, UA Negative Negative   Bilirubin, UA negative    Ketones, UA negative    Spec Grav, UA 1.025 1.010 - 1.025   Blood, UA large    pH, UA 6.0 5.0 - 8.0   Protein, UA Negative Negative    Urobilinogen, UA 0.2 0.2 or 1.0 E.U./dL   Nitrite, UA neg    Leukocytes, UA Negative Negative   Appearance     Odor        Assessment & Plan:   Problem List Items Addressed This Visit     Psychophysiological insomnia   PTSD (post-traumatic stress disorder) - Primary   Other Visit Diagnoses     Diarrhea, unspecified type           Diarrhea uncertain etiology, discussed variable symptoms On Imodium AS NEEDED If persistent symptoms or worsening, can offer stool study vs KUB / CT imaging in future.  PTSD Anxiety Insomnia Followed by Psychiatry at [facility] On Alprazolam chronic BDZ, she discusses anxiety and PTSD triggers, benefits from Alprazolam I advised her that I would be unlikely to manage this medication for her and she will follow up with Psychiatry for dose adjustment and other adjunct therapy  No orders of the defined types were placed in this encounter.     Follow up plan: Return if symptoms worsen or fail to improve.  Patient verbalizes understanding with the above medical recommendations including the limitation of remote medical advice.  Specific follow-up and call-back criteria were given for patient to follow-up or seek medical care more urgently if needed.  Total duration of direct patient care provided via video conference: 18 minutes    Saralyn Pilar, DO [facility] 01/21/2023, 4:23 PM

## 2023-01-21 NOTE — Patient Instructions (Signed)
° °  Please schedule a Follow-up Appointment to: No follow-ups on file. ° °If you have any other questions or concerns, please feel free to call the office or send a message through MyChart. You may also schedule an earlier appointment if necessary. ° °Additionally, you may be receiving a survey about your experience at our office within a few days to 1 week by e-mail or mail. We value your feedback. ° °Anhad Sheeley, DO °[facility] °

## 2023-01-23 ENCOUNTER — Ambulatory Visit: Payer: Self-pay

## 2023-01-23 ENCOUNTER — Other Ambulatory Visit: Payer: Self-pay

## 2023-01-23 ENCOUNTER — Ambulatory Visit: Payer: Medicaid Other | Admitting: Physician Assistant

## 2023-01-23 ENCOUNTER — Encounter: Payer: Self-pay | Admitting: Physician Assistant

## 2023-01-23 ENCOUNTER — Emergency Department
Admission: EM | Admit: 2023-01-23 | Discharge: 2023-01-23 | Discharge status: Against Medical Advice | Payer: Medicaid Other | Attending: Emergency Medicine | Admitting: Emergency Medicine

## 2023-01-23 VITALS — BP 115/75 | HR 76 | Temp 98.6°F | Wt 181.8 lb

## 2023-01-23 DIAGNOSIS — R079 Chest pain, unspecified: Secondary | ICD-10-CM

## 2023-01-23 DIAGNOSIS — S50861A Insect bite (nonvenomous) of right forearm, initial encounter: Secondary | ICD-10-CM | POA: Diagnosis not present

## 2023-01-23 DIAGNOSIS — W57XXXA Bitten or stung by nonvenomous insect and other nonvenomous arthropods, initial encounter: Secondary | ICD-10-CM

## 2023-01-23 DIAGNOSIS — R519 Headache, unspecified: Secondary | ICD-10-CM | POA: Diagnosis not present

## 2023-01-23 DIAGNOSIS — Z5321 Procedure and treatment not carried out due to patient leaving prior to being seen by health care provider: Secondary | ICD-10-CM | POA: Insufficient documentation

## 2023-01-23 DIAGNOSIS — G43909 Migraine, unspecified, not intractable, without status migrainosus: Secondary | ICD-10-CM | POA: Diagnosis not present

## 2023-01-23 DIAGNOSIS — R110 Nausea: Secondary | ICD-10-CM | POA: Diagnosis not present

## 2023-01-23 MED ORDER — TRIAMCINOLONE ACETONIDE 0.5 % EX CREA: 1.0000 | TOPICAL_CREAM | Freq: Three times a day (TID) | CUTANEOUS | 0 refills | Status: DC

## 2023-01-23 NOTE — Patient Instructions (Addendum)
I have sent in a script for Kenalog cream to help with the itching and burning on your forearm   You can apply a cool pack or ice pack (covered with fabric ) for 10 minutes on and 30 minutes off to help with swelling and pain relief as needed You can continue to take Tylenol as needed for pain   Keep the area clean with warm water and gentle soap.  Avoid picking, squeezing or irritating the area.

## 2023-01-23 NOTE — ED Triage Notes (Signed)
Pt presents to ED with headache and insect bite to R forearm area. Pt unsure of tick bite. Pt denies injury or trauma.

## 2023-01-23 NOTE — Progress Notes (Signed)
EKG performed today in office EKG demonstrates normal sinus rhythm, rate of 68 bpm Today's EKG was compared to previous EKG from 09/29/22 and there are no significant changes

## 2023-01-23 NOTE — Telephone Encounter (Signed)
  Chief Complaint: Spider bite - brown recluse Symptoms: Redness swelling HA Frequency: Weds night Pertinent Negatives: Patient denies  Disposition: [] ED /[] Urgent Care (no appt availability in office) / [x] Appointment(In office/virtual)/ []  [place] Virtual Care/ [] Home Care/ [] Refused Recommended Disposition /[] [place] Mobile [facility]/ []  Follow-up with PCP Additional Notes: Pt states she thinks she was bitten by a brown recluse spider weds night on her arm. Pt states area is red and getting bigger. Pt also has HA. PT will see Erin Mecum this afternoon at [facility].    Reason for Disposition  [1] Red or very tender (to touch) area AND [2] started over 24 hours after the bite  Answer Assessment - Initial Assessment Questions 1. TYPE of INSECT: "What type of insect was it?"      *No Answer* 2. ONSET: "When did you get bitten?"      Weds night 3. LOCATION: "Where is the insect bite located?"      *No Answer* 4. REDNESS: "Is the area red or pink?" If Yes, ask: "What size is area of redness?" (inches or cm). "When did the redness start?"     *No Answer* 5. PAIN: "Is there any pain?" If Yes, ask: "How bad is it?"  (Scale 1-10; or mild, moderate, severe)     moderate 6. ITCHING: "Does it itch?" If Yes, ask: "How bad is the itch?"    - MILD: doesn't interfere with normal activities   - MODERATE-SEVERE: interferes with work, school, sleep, or other activities      *No Answer* 7. SWELLING: "How big is the swelling?" (inches, cm, or compare to coins)     *No Answer* 8. OTHER SYMPTOMS: "Do you have any other symptoms?"  (e.g., difficulty breathing, hives)     *No Answer* 9. PREGNANCY: "Is there any chance you are pregnant?" "When was your last menstrual period?"     *No Answer*  Answer Assessment - Initial Assessment Questions 1. TYPE of SPIDER: "What type of spider was it?"  (e.g., name, unknown, or brief description)     Possible brown recluse 2. LOCATION: "Where is the bite located?"       right 3. PAIN: "Is there any pain?" If Yes, ask: "How bad is it?"  (Scale 1-10; or mild, moderate, severe)    - NONE (0): no pain    - MILD (1-3): doesn't interfere with normal activities     - MODERATE (4-7): interferes with normal activities or awakens from sleep     - SEVERE (8-10): excruciating pain, unable to do any normal activities     moderate 4. SWELLING: "How big is the swelling?" (Inches, cm or compare to coins)      Getting bigger 5. ONSET: "When did the bite occur?" (Minutes or hours ago)      Weds night 6. TETANUS: "When was the last tetanus booster?"      None - Pt is allergic 7. OTHER SYMPTOMS: "Do you have any other symptoms?"  (e.g., muscle cramps, abdomen pain, change in urine color)     HA, Arm is swollen and red  Protocols used: Insect Bite-A-AH, Spider Bite - [employer]

## 2023-01-23 NOTE — Telephone Encounter (Signed)
  Chief Complaint: Wants additional blood work Symptoms: Sees bullseye on arm Frequency: today Pertinent Negatives: Patient denies  Disposition: [] ED /[] Urgent Care (no appt availability in office) / [] Appointment(In office/virtual)/ []  [place] Virtual Care/ [] Home Care/ [] Refused Recommended Disposition /[] [place] Mobile Bus/ [x]  Follow-up with PCP Additional Notes: Pt was seen this afternoon by Erin Mecum at [facility] for spider bite. Pt now sees a "bullseye" Pattern on her arm and would like additional blood work performed to see if her white blood count is elevated and if she has tick fever.  Pt would like lab orders called in.  Advised pt to go to UC for care.  Pt would like a call back regarding lab orders. Please advise.    Reason for Disposition  Caller requesting lab results  (Exception: Routine or non-urgent lab result.)  Answer Assessment - Initial Assessment Questions 1. REASON FOR CALL or QUESTION: "What is your reason for calling today?" or "How can I best help you?" or "What question do you have that I can help answer?"     Pt states that she now sees a "bullseye" pattern on her arm 2. CALLER: Document the source of call. (e.g., laboratory, patient).     PT  Protocols used: PCP Call - No Triage-A-AH

## 2023-01-23 NOTE — ED Notes (Addendum)
Pt back to the desk asking for staff to look at her arm. And is asking to leave. Pt is accusing staff of making fun of her for being here two days ago. Pt is very disgruntled. Pt refused all labs this visit and last visit she was here. Pt is very paranoid and is leaving AMA and continued to curse and mumble under her breath comments about our staff.

## 2023-01-23 NOTE — ED Notes (Signed)
Pt back to the nurses desk to ask for a bandaid. Pt made aware we dont have bandaids, but can give her clean guaze and some coban. Pt has no bleeding coming from blood draw site. Pt then states "can I call 911 and they take me to [facility] from here". This RN educated her that no they would not take her to [facility] from here. Pt walked off and sat down.

## 2023-01-23 NOTE — Progress Notes (Unsigned)
Acute Office Visit   Patient: Shirley [name]   DOB: 09-04-1970   52 y.o. Female  MRN: [id number] Visit Date: 01/23/2023  Today's healthcare provider: Oswaldo Conroy Lillybeth Tal, PA-C  Introduced myself to the patient as a Secondary school teacher and provided education on APPs in clinical practice.    Chief Complaint  Patient presents with   Insect Bite    Pt states she was bit by a spider on her R forearm on Wednesday night. Patient states it felt like a needle sticking her. States she was also bitten on her R side last night as well. States she has been having a headache since getting bit. States the areas are also burning and itching.    Subjective    HPI HPI     Insect Bite    Additional comments: Pt states she was bit by a spider on her R forearm on Wednesday night. Patient states it felt like a needle sticking her. States she was also bitten on her R side last night as well. States she has been having a headache since getting bit. States the areas are also burning and itching.       Last edited by Pablo Ledger, CMA on 01/23/2023  2:30 PM.       Concern for insect bites  She reports she noticed a red bump on her right forearm on Wed night  She states she has several different kinds of spiders around her home and is concerned one bit her on her forearm  She reports the bump on her forearm is itching and stings as well as burns   She reports a second bump on her right side that is nontender and mildly red  She states she was getting her eyebrows done yesterday and started to have a headache along with dizziness and chest pain and nausea   She states she has been taking Tylenol for pain    Medications: Outpatient Medications Prior to Visit  Medication Sig   acyclovir (ZOVIRAX) 400 MG tablet Take 1 tablet (400 mg total) by mouth 3 (three) times daily.   albuterol (VENTOLIN HFA) 108 (90 Base) MCG/ACT inhaler TAKE 2 PUFFS BY MOUTH EVERY 6 HOURS AS NEEDED FOR WHEEZE OR SHORTNESS OF BREATH    ALPRAZolam (XANAX) 1 MG tablet Take 1 mg by mouth 3 (three) times daily as needed for anxiety.   fluticasone (FLONASE) 50 MCG/ACT nasal spray SPRAY 2 SPRAYS INTO EACH NOSTRIL EVERY DAY   amitriptyline (ELAVIL) 50 MG tablet Take 1 tablet (50 mg total) by mouth at bedtime. (Patient not taking: Reported on 01/21/2023)   cetirizine (ZYRTEC) 10 MG tablet TAKE 1 TABLET BY MOUTH EVERY DAY (Patient not taking: Reported on 01/21/2023)   Cholecalciferol (VITAMIN D3) 125 MCG (5000 UT) CAPS Take by mouth. (Patient not taking: Reported on 01/21/2023)   erythromycin ophthalmic ointment Place 1 Application into both eyes 4 (four) times daily. For 7-10 day (Patient not taking: Reported on 01/21/2023)   fosfomycin (MONUROL) 3 g PACK TAKE 3 G BY MOUTH ONCE FOR 1 DOSE. (Patient not taking: Reported on 01/21/2023)   valsartan (DIOVAN) 160 MG tablet Take 1 tablet (160 mg total) by mouth daily. (Patient not taking: Reported on 01/21/2023)   No facility-administered medications prior to visit.    Review of Systems  Constitutional:  Positive for fatigue.  Skin:  Positive for rash.  Neurological:  Positive for dizziness.       Objective  BP 115/75   Pulse 76   Temp 98.6 F (37 C) (Oral)   Wt 181 lb 12.8 oz (82.5 kg)   LMP  (LMP Unknown) Comment: last cycle over a year ago  SpO2 98%   BMI 29.34 kg/m    Physical Exam Vitals reviewed.  Constitutional:      General: She is awake. She is not in acute distress.    Appearance: Normal appearance. She is well-developed and well-groomed. She is not ill-appearing or toxic-appearing.  HENT:     Head: Normocephalic and atraumatic.  Pulmonary:     Effort: Pulmonary effort is normal.  Musculoskeletal:     Cervical back: Normal range of motion.  Skin:    General: Skin is warm and dry.       Neurological:     Mental Status: She is alert.  Psychiatric:        Attention and Perception: Attention normal.        Mood and Affect: Mood is anxious.        Speech:  Speech is rapid and pressured and tangential.        Behavior: Behavior is cooperative.     EKG performed today in office EKG demonstrates normal sinus rhythm, rate of 68 bpm Today's EKG was compared to previous EKG from 09/29/22 and there are no significant changes    No results found for any visits on 01/23/23.  Assessment & Plan      No follow-ups on file.      Problem List Items Addressed This Visit   None Visit Diagnoses     Insect bite of right forearm, initial encounter    -  Primary Acute, new concern Discussed with patient that the bump on her forearm could potentially be from any number of insects or spiders - without observed bite/sting it is difficult to know what caused it but in the absence of red flag symptoms- we will treat symptomatically  Recommend using ice pack to assist with discomfort and swelling Will send in script for Kenalog cream to assist with itching as needed Reviewed signs of concerning development such as color change, signs of necrosis, swelling, streaking, new rash development, drainage Significant amount of visit was spent reassuring patient of benign nature of areas of concern  Follow up as needed for persistent or progressing symptoms     Relevant Medications   triamcinolone cream (KENALOG) 0.5 %   Chest pain, unspecified type     Unsure of chronicity as it is difficult to keep patient focused  Patient reports onset of headache, nausea and chest pains over the past few days - we discussed that these symptoms can sometimes be consistent with MI in women and evaluation is limited in this office Will get EKG- demonstrated normal sinus rhythm without changes from previous EKG in 09/29/22  Reviewed that troponins would be needed for definitive rule out and these are obtained in ED Patient appeared reassured with normal EKG at this time. Reviewed further ED precautions and follow up instructions should further concerns arise.  Follow up as needed for  persistent or progressing symptoms     Relevant Orders   EKG 12-Lead (Completed)      At least 20 additional minutes were spent with patient discussing insect and spider bites as well as concerning symptoms which would require further evaluation. We also discussed symptom concerning for STEMI/NSTEMI and appropriate ED follow up for concerns.   No follow-ups on file.   I, Oswaldo Conroy  Tmya Wigington, PA-C, have reviewed all documentation for this visit. The documentation on 01/26/23 for the exam, diagnosis, procedures, and orders are all accurate and complete.   Jacquelin Hawking, MHS, PA-C [facility]

## 2023-01-23 NOTE — ED Notes (Signed)
Pt refused blood work after first attempt was unsuccessful.  Pt then came to first nurse desk complaining about a little blood coming from her blood draw stick. Pt was given more tape for her guaze. Pt then placed in wheelchair.

## 2023-01-24 ENCOUNTER — Emergency Department
Admission: EM | Admit: 2023-01-24 | Discharge: 2023-01-24 | Disposition: A | Discharge status: Home / Self Care | Payer: Medicaid Other | Attending: Emergency Medicine | Admitting: Emergency Medicine

## 2023-01-24 ENCOUNTER — Other Ambulatory Visit: Payer: Self-pay

## 2023-01-24 DIAGNOSIS — W57XXXA Bitten or stung by nonvenomous insect and other nonvenomous arthropods, initial encounter: Secondary | ICD-10-CM | POA: Insufficient documentation

## 2023-01-24 DIAGNOSIS — L539 Erythematous condition, unspecified: Secondary | ICD-10-CM | POA: Diagnosis not present

## 2023-01-24 DIAGNOSIS — I10 Essential (primary) hypertension: Secondary | ICD-10-CM | POA: Diagnosis not present

## 2023-01-24 DIAGNOSIS — G4700 Insomnia, unspecified: Secondary | ICD-10-CM | POA: Diagnosis not present

## 2023-01-24 DIAGNOSIS — F439 Reaction to severe stress, unspecified: Secondary | ICD-10-CM

## 2023-01-24 DIAGNOSIS — L299 Pruritus, unspecified: Secondary | ICD-10-CM | POA: Diagnosis present

## 2023-01-24 DIAGNOSIS — S50861A Insect bite (nonvenomous) of right forearm, initial encounter: Secondary | ICD-10-CM | POA: Diagnosis not present

## 2023-01-24 DIAGNOSIS — S70261A Insect bite (nonvenomous), right hip, initial encounter: Secondary | ICD-10-CM | POA: Diagnosis not present

## 2023-01-24 LAB — COMPREHENSIVE METABOLIC PANEL
ALT: 15 U/L (ref 0–44)
AST: 16 U/L (ref 15–41)
Albumin: 3.8 g/dL (ref 3.5–5.0)
Alkaline Phosphatase: 75 U/L (ref 38–126)
Anion gap: 6 (ref 5–15)
BUN: 12 mg/dL (ref 6–20)
CO2: 25 mmol/L (ref 22–32)
Calcium: 8.9 mg/dL (ref 8.9–10.3)
Chloride: 109 mmol/L (ref 98–111)
Creatinine, Ser: 0.8 mg/dL (ref 0.44–1.00)
GFR, Estimated: >60 mL/min (ref >60)
Glucose, Bld: 101 mg/dL — ABNORMAL HIGH (ref 70–99)
Potassium: 3.3 mmol/L — ABNORMAL LOW (ref 3.5–5.1)
Sodium: 140 mmol/L (ref 135–145)
Total Bilirubin: 0.4 mg/dL (ref 0.3–1.2)
Total Protein: 6.3 g/dL — ABNORMAL LOW (ref 6.5–8.1)

## 2023-01-24 LAB — CBC
HCT: 35.5 % — ABNORMAL LOW (ref 36.0–46.0)
Hemoglobin: 11.8 g/dL — ABNORMAL LOW (ref 12.0–15.0)
MCH: 29.1 pg (ref 26.0–34.0)
MCHC: 33.2 g/dL (ref 30.0–36.0)
MCV: 87.4 fL (ref 80.0–100.0)
Platelets: 220 10*3/uL (ref 150–400)
RBC: 4.06 MIL/uL (ref 3.87–5.11)
RDW: 12.6 % (ref 11.5–15.5)
WBC: 6.2 10*3/uL (ref 4.0–10.5)
nRBC: 0 % (ref 0.0–0.2)

## 2023-01-24 LAB — TSH: TSH: 1.909 u[IU]/mL (ref 0.350–4.500)

## 2023-01-24 NOTE — ED Triage Notes (Addendum)
C/O two spider bites.  C/O N/V x 2-3 days.  Also c/o bite itching, difficulty sleeping.  Patient is AAOx3.  Skin warm and dry. NAD  Patient with multiple vague complaints.  States has not been taking blood pressure medications.

## 2023-01-24 NOTE — ED Provider Notes (Signed)
[facility] Provider Note    Event Date/Time   First MD Initiated Contact with Patient 01/24/23 [phone]     (approximate)  History   Chief Complaint: Insect Bite  HPI  Shirley [name] is a 52 y.o. female with a past medical history of anxiety, ADHD, hypertension, presents to the emergency department with multiple/vague symptoms.  According to the patient the last couple days she has noticed 2 insect bites 1 to her right forearm and 1 to her right hip.  States they have been itching her at night she has not been sleeping.  Patient is very anxious appearing with rapid speech, states she came to the emergency department yesterday to be evaluated but could not tolerate the blood draw so she left the emergency department.  Patient states she is prescribed Xanax takes Xanax multiple times per day as prescribed by her doctor.  States she has not been checking her blood pressure taking her blood pressure medication.  She is prescribed to Xanax at nighttime but she states she has only been taking 1 and she has not been sleeping.  Patient states last night she got approximately 2 hours of sleep.  Patient describes generalized bodyaches.  Did not see anything bite her, no tick bites.  The areas of concern for the patient are very small appearing 0.5 cm or less in diameter consistent with insect bite, no concern for abscess or cellulitis or other concerning abnormality.  No other rash.  Physical Exam   Triage Vital Signs: ED Triage Vitals  Enc Vitals Group     BP 01/24/23 0707 (!) 163/88     Pulse Rate 01/24/23 0707 76     Resp 01/24/23 0707 18     Temp 01/24/23 0707 98 F (36.7 C)     Temp Source 01/24/23 0707 Oral     SpO2 01/24/23 0707 100 %     Weight 01/24/23 0706 180 lb 12.4 oz (82 kg)     Height 01/24/23 0706 5\' 6"  (1.676 m)     Head Circumference --      Peak Flow --      Pain Score 01/24/23 0705 4     Pain Loc --      Pain Edu? --      Excl. in GC? --     Most  recent vital signs: Vitals:   01/24/23 0707  BP: (!) 163/88  Pulse: 76  Resp: 18  Temp: 98 F (36.7 C)  SpO2: 100%    General: Awake, no distress.  CV:  Good peripheral perfusion.  Regular rate and rhythm  Resp:  Normal effort.  Equal breath sounds bilaterally.  Abd:  No distention.  Soft, nontender.  No rebound or guarding. Other:  0.5 cm diameter area of erythema to the right forearm and to the right hip with no signs of abscess or cellulitis.  No rash.  Very benign appearing.   ED Results / Procedures / Treatments   MEDICATIONS ORDERED IN ED: Medications - No data to display   IMPRESSION / MDM / ASSESSMENT AND PLAN / ED COURSE  I reviewed the triage vital signs and the nursing notes.  Patient's presentation is most consistent with acute illness / injury with system symptoms.  Patient presents to the emergency department for 2 insect bites.  States they are itching and burning and keeping her awake at night.  However she then goes on to describe for the past year or 2 she has  not been sleeping at night she recently moved, her sister recently passed away she feels like her stress and anxiety are much worse recently.  During my evaluation I agree that most of the patient's symptoms are very likely related more to anxiety stress and lack of sleep then they are to the insect bites which appear very benign.  Discussed with the patient use of a topical medication to help with itching/burning at the area of the bite.  Also discussed with the patient the importance of following up with her doctor and therapist regarding her increased stress and anxiety as well as significant Xanax use.  I did offer to place the patient on trazodone at night instead of Xanax but stressed to the patient that this would not be in addition to Xanax.  Patient states she wants to think about this.  We will check basic labs including a CBC chemistry and a TSH.  Patient is lab work is reassuring, chemistry shows no  concerning findings, CBC is normal, TSH is normal.  Given the patient's reassuring workup we will discharge with outpatient follow-up topical medication for the insect bite.  Patient agreeable to plan of care.  Patient states her doctor called her in triamcinolone which is appropriate.  She will follow-up with her doctor.  FINAL CLINICAL IMPRESSION(S) / ED DIAGNOSES   Insect bite Insomnia   Note:  This document was prepared using Dragon voice recognition software and may include unintentional dictation errors.   Minna Antis, MD 01/24/23 [phone]

## 2023-01-27 ENCOUNTER — Other Ambulatory Visit: Payer: Self-pay | Admitting: Family Medicine

## 2023-01-27 DIAGNOSIS — I10 Essential (primary) hypertension: Secondary | ICD-10-CM

## 2023-01-28 ENCOUNTER — Ambulatory Visit: Payer: Self-pay | Admitting: *Deleted

## 2023-01-28 ENCOUNTER — Encounter: Payer: Self-pay | Admitting: Family Medicine

## 2023-01-28 NOTE — Telephone Encounter (Signed)
  Chief Complaint: right arm swelling  Symptoms: right arm swelling after tick removed 4-5 days ago . Seen in ED 01/24/23 and released. Reports redness and dot in middle where tick removed. Tick was tiny brown. Right arm stiff. Has had cold sx. Sweating low grade fever Frequency: 4-5 days ago  Pertinent Negatives: Patient denies unable to use right arm no swelling in hand or fingers. Disposition: [] ED /[] Urgent Care (no appt availability in office) / [x] Appointment(In office/virtual)/ []  [facility]/ [] Home Care/ [] Refused Recommended Disposition /[] [place] [facility]/ []  Follow-up with PCP Additional Notes:   Earliest appt available 02/03/23 and scheduled for patient . Recommended if pain worsens swelling worsens go to UC / fast med where she was advised to go . Reports ED advised to f/u with PCP . Difficulty getting appt. On waitlist. Please advise. Patient reports she is in parking lot of office and going in with questions regarding itemized bill. In review of chart copay show $ 0.00 to pay .     Reason for Disposition  MODERATE arm swelling (e.g., puffiness or swollen feeling of entire arm)  Answer Assessment - Initial Assessment Questions 1. ONSET: "When did the swelling start?" (e.g., minutes, hours, days)     4-5 days ago  2. LOCATION: "What part of the arm is swollen?"  "Are both arms swollen or just one arm?"     Right arm  3. SEVERITY: "How bad is the swelling?" (e.g., localized; mild, moderate, severe)   - LOCALIZED: Small area of puffiness or swelling on just one arm   - JOINT SWELLING: Swelling of one joint   - MILD: Puffiness or swelling of hand   - MODERATE: Puffiness or swollen feeling of entire arm    - SEVERE: All of arm looks swollen; pitting edema     Bigger than left arm  4. REDNESS: "Does the swelling look red or infected?"     Redness and dot in middle of redness where patient reports removing tick 5. PAIN: "Is the swelling painful to touch?" If Yes,  ask: "How painful is it?"   (Scale 1-10; mild, moderate or severe)     Na  6. FEVER: "Do you have a fever?" If Yes, ask: "What is it, how was it measured, and when did it start?"      Low grade 7. CAUSE: "What do you think is causing the arm swelling?"     Where tick removed 4-5 days ago  8. MEDICAL HISTORY: "Do you have a history of heart failure, kidney disease, liver failure, or cancer?"     See hx  9. RECURRENT SYMPTOM: "Have you had arm swelling before?" If Yes, ask: "When was the last time?" "What happened that time?"     Na  10. OTHER SYMPTOMS: "Do you have any other symptoms?" (e.g., chest pain, difficulty breathing)       Had cold sx. Now right arm swelling redness with dot in center of where tick removed. "Tiny tick"  11. PREGNANCY: "Is there any chance you are pregnant?" "When was your last menstrual period?"       na  Protocols used: Arm Swelling and Edema-A-AH

## 2023-01-28 NOTE — Telephone Encounter (Addendum)
Pt called and stated some concerns about her care. She feels  like she needs to be checked and treated for RMSF. Pt stated she has had joint pain x 7 days. Red ring around it now. Feels like she is having to go to UC for most of her care. And feels like she is being labeled as anxious rather than her swollen.  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 Pt stated that she can only take Acyclovir and this was not called it she said. Pt wanting to discuss her lab work  and trying to figure out what her lab means.   Advised will forward to office.  See pt message.

## 2023-01-28 NOTE — Telephone Encounter (Signed)
Rx 09/24/22 #90 1RF- too soon Requested Prescriptions  Pending Prescriptions Disp Refills   valsartan (DIOVAN) 160 MG tablet [Pharmacy Med Name: VALSARTAN 160 MG TABLET] 90 tablet 1    Sig: TAKE 1 TABLET BY MOUTH EVERY DAY     Cardiovascular:  Angiotensin Receptor Blockers Failed - 01/27/2023  9:23 AM      Failed - K in normal range and within 180 days    Potassium  Date Value Ref Range Status  01/24/2023 3.3 (L) 3.5 - 5.1 mmol/L Final  04/18/2012 3.1 (L) 3.5 - 5.1 mmol/L Final         Failed - Last BP in normal range    BP Readings from Last 1 Encounters:  01/24/23 (!) 163/88         Passed - Cr in normal range and within 180 days    Creat  Date Value Ref Range Status  04/17/2022 0.74 0.50 - 1.03 mg/dL Final   Creatinine, Ser  Date Value Ref Range Status  01/24/2023 0.80 0.44 - 1.00 mg/dL Final         Passed - Patient is not pregnant      Passed - Valid encounter within last 6 months    Recent Outpatient Visits           5 days ago Insect bite of right forearm, initial encounter   [facility] Mecum, Oswaldo Conroy, PA-C   1 week ago PTSD (post-traumatic stress disorder)   [facility] Smitty Cords, DO   1 month ago Acute conjunctivitis of left eye, unspecified acute conjunctivitis type   [facility] Smitty Cords, DO   1 month ago Chronic interstitial cystitis   [facility] Smitty Cords, DO   2 months ago Neoplasm of uncertain behavior   [facility] Smitty Cords, DO       Future Appointments             In 2 months Althea Charon, [name], DO [facility], [facility]

## 2023-02-02 ENCOUNTER — Telehealth: Payer: Self-pay

## 2023-02-02 NOTE — Telephone Encounter (Signed)
She has apt for 7/9  I am not sure what else to offer her at this time. She often will send a mychart message when she cannot get an appt immediately that day. We are trying to work with her.  I can tell her at the visit the timeline for testing for tickborne illnesses.  Saralyn Pilar, DO [facility] 02/02/2023, 12:43 PM

## 2023-02-02 NOTE — Telephone Encounter (Signed)
Chart review completed for patient. Patient is due for screening mammogram. Mychart message sent to patient to inquire about scheduling mammogram.  Garvis Downum, Population Health Specialist.  

## 2023-02-03 ENCOUNTER — Ambulatory Visit: Payer: Medicaid Other | Admitting: Family Medicine

## 2023-02-03 ENCOUNTER — Encounter: Payer: Self-pay | Admitting: Family Medicine

## 2023-02-03 VITALS — BP 110/70 | HR 64 | Resp 17 | Ht 66.0 in | Wt 181.4 lb

## 2023-02-03 DIAGNOSIS — S50861A Insect bite (nonvenomous) of right forearm, initial encounter: Secondary | ICD-10-CM | POA: Diagnosis not present

## 2023-02-03 DIAGNOSIS — W57XXXA Bitten or stung by nonvenomous insect and other nonvenomous arthropods, initial encounter: Secondary | ICD-10-CM

## 2023-02-03 DIAGNOSIS — B009 Herpesviral infection, unspecified: Secondary | ICD-10-CM | POA: Diagnosis not present

## 2023-02-03 DIAGNOSIS — D649 Anemia, unspecified: Secondary | ICD-10-CM | POA: Diagnosis not present

## 2023-02-03 MED ORDER — ACYCLOVIR 400 MG PO TABS: 400.0000 mg | ORAL_TABLET | Freq: Three times a day (TID) | ORAL | 3 refills | Status: DC

## 2023-02-03 NOTE — Telephone Encounter (Signed)
I'm not really sure either. We ran an EKG at her office apt since she complained of chest pain (all normal) but she was adamant that a spider bit her that day and she did not mention a tick at all. The areas in question were very benign and I tried to reassure her and explain the symptoms we would be concerned about for more severe spider bites (necrosis, streaking, fever, swelling, etc).

## 2023-02-03 NOTE — Progress Notes (Signed)
Subjective:    Patient ID: Shirley [name], female    DOB: [birth year], 52 y.o.   MRN: [id number]  Shirley [name] is a 52 y.o. female presenting on 02/03/2023 for Follow-up (Pt comes in today to follow up on recent lab results ) and Medication Refill (Need a refill on acyclovir )   HPI  R Forearm Tick Bite Uncertain if tick or other insect, has been evaluated by ED / UC Initial onset 2+ weeks ago, has improved. No extending erythema or rash. She had some cold chills and nausea Now no significant symptoms just concerned about it  [facility] ED Lab results reviewed 01/24/23 Glucose 101 Hemoglobin 11.8, mild reduced TSH normal  She is interested in trial on oral iron supplement  Has upcoming apt Dr Janeece Riggers Psychiatry  HSV2 rash On chest with recurrent blistering Confirmed previously by swab biopsy Takes Acyclovir AS NEEDED for flares  IBS-D Chronic diarrhea since age 46      02/03/2023    3:04 PM 12/04/2022   12:41 PM 10/14/2022    4:08 PM  Depression screen PHQ 2/9  Decreased Interest 1 3 3   Down, Depressed, Hopeless 1 2 3   PHQ - 2 Score 2 5 6   Altered sleeping 1 3 3   Tired, decreased energy 1 3 3   Change in appetite 1 2 3   Feeling bad or failure about yourself  1 3 3   Trouble concentrating 1 1 0  Moving slowly or fidgety/restless 0 3 3  Suicidal thoughts 1 0 0  PHQ-9 Score 8 20 21   Difficult doing work/chores Somewhat difficult Somewhat difficult Extremely dIfficult      02/03/2023    3:04 PM 10/14/2022    4:10 PM 07/29/2022    3:30 PM 10/02/2021    2:19 PM  GAD 7 : Generalized Anxiety Score  Nervous, Anxious, on Edge 1 3 3 1   Control/stop worrying 1 3 1 1   Worry too much - different things 1 3 1  0  Trouble relaxing 1 3 1 1   Restless 1 3 1  0  Easily annoyed or irritable 1 3 1 1   Afraid - awful might happen 1 3 1  0  Total GAD 7 Score 7 21 9 4   Anxiety Difficulty Somewhat difficult Extremely difficult Somewhat difficult Very difficult      Social History   Tobacco Use    Smoking status: Never    Passive exposure: Never   Smokeless tobacco: Never  Vaping Use   Vaping Use: Never used  Substance Use Topics   Alcohol use: Not Currently   Drug use: Never    Review of Systems Per HPI unless specifically indicated above     Objective:    BP 110/70 (BP Location: Right Arm, Patient Position: Sitting, Cuff Size: Normal)   Pulse 64   Resp 17   Ht 5\' 6"  (1.676 m)   Wt 181 lb 6.4 oz (82.3 kg)   LMP  (LMP Unknown) Comment: last cycle over a year ago  SpO2 99%   BMI 29.28 kg/m   Wt Readings from Last 3 Encounters:  02/03/23 181 lb 6.4 oz (82.3 kg)  01/24/23 180 lb 12.4 oz (82 kg)  01/23/23 181 lb 12.8 oz (82.5 kg)    Physical Exam Vitals and nursing note reviewed.  Constitutional:      General: She is not in acute distress.    Appearance: Normal appearance. She is well-developed. She is not diaphoretic.     Comments: Well-appearing, comfortable, cooperative  HENT:     Head: Normocephalic and atraumatic.  Eyes:     General:        Right eye: No discharge.        Left eye: No discharge.     Conjunctiva/sclera: Conjunctivae normal.  Cardiovascular:     Rate and Rhythm: Normal rate.  Pulmonary:     Effort: Pulmonary effort is normal.  Skin:    General: Skin is warm and dry.     Findings: Lesion (R forearm inner 1 cm area of not raised or red, resolving bite) present. No erythema or rash.  Neurological:     Mental Status: She is alert and oriented to person, place, and time.  Psychiatric:        Mood and Affect: Mood normal.        Behavior: Behavior normal.        Thought Content: Thought content normal.     Comments: Well groomed, good eye contact, normal speech and thoughts      Results for orders placed or performed during the hospital encounter of 01/24/23  CBC  Result Value Ref Range   WBC 6.2 4.0 - 10.5 K/uL   RBC 4.06 3.87 - 5.11 MIL/uL   Hemoglobin 11.8 (L) 12.0 - 15.0 g/dL   HCT 16.1 (L) 09.6 - 04.5 %   MCV 87.4 80.0 - 100.0  fL   MCH 29.1 26.0 - 34.0 pg   MCHC 33.2 30.0 - 36.0 g/dL   RDW 40.9 81.1 - 91.4 %   Platelets 220 150 - 400 K/uL   nRBC 0.0 0.0 - 0.2 %  Comprehensive metabolic panel  Result Value Ref Range   Sodium 140 135 - 145 mmol/L   Potassium 3.3 (L) 3.5 - 5.1 mmol/L   Chloride 109 98 - 111 mmol/L   CO2 25 22 - 32 mmol/L   Glucose, Bld 101 (H) 70 - 99 mg/dL   BUN 12 6 - 20 mg/dL   Creatinine, Ser 7.82 0.44 - 1.00 mg/dL   Calcium 8.9 8.9 - 95.6 mg/dL   Total Protein 6.3 (L) 6.5 - 8.1 g/dL   Albumin 3.8 3.5 - 5.0 g/dL   AST 16 15 - 41 U/L   ALT 15 0 - 44 U/L   Alkaline Phosphatase 75 38 - 126 U/L   Total Bilirubin 0.4 0.3 - 1.2 mg/dL   GFR, Estimated >21 >30 mL/min   Anion gap 6 5 - 15  TSH  Result Value Ref Range   TSH 1.909 0.350 - 4.500 uIU/mL      Assessment & Plan:   Problem List Items Addressed This Visit   None Visit Diagnoses     Tick bite of right forearm, initial encounter    -  Primary   Normocytic anemia       HSV (herpes simplex virus) infection       Relevant Medications   acyclovir (ZOVIRAX) 400 MG tablet       The bite on R Forearm No evidence of tick borne illness based on lesion or symptoms at this time. Reassurance. Defer lab testing. As discussed not useful until 3+ weeks out from actual tick bite I would not use antibiotic based on what the bite looks like. It is healing.  Keep up with Psychiatry Dr Janeece Riggers as planned for medication management  HSV flares Refilled Acyclovir PRN  For Diarrhea IBgard OTC Peppermint Oil (Triple Coated Capsule) 180mg  take one 3 times daily to reduce diarrhea  Recommend OTC  Iron supplement ferrous sulfate 325mg  once daily with meal.  Meds ordered this encounter  Medications   acyclovir (ZOVIRAX) 400 MG tablet    Sig: Take 1 tablet (400 mg total) by mouth 3 (three) times daily.    Dispense:  21 tablet    Refill:  3    Follow up plan: Return if symptoms worsen or fail to improve.  Saralyn Pilar, DO [facility] 02/03/2023, 3:10 PM

## 2023-02-03 NOTE — Patient Instructions (Addendum)
Thank you for coming to the office today.  The bite on R Forearm I would not use antibiotic based on what the bite looks like. It is healing.  Keep up with Psychiatry Dr Janeece Riggers as planned.  Refilled Acyclovir.  For Diarrhea IBgard OTC Peppermint Oil (Triple Coated Capsule) 180mg  take one 3 times daily to reduce diarrhea  Recommend OTC Iron supplement ferrous sulfate 325mg  once daily with meal.  Please schedule a Follow-up Appointment to: Return if symptoms worsen or fail to improve.  If you have any other questions or concerns, please feel free to call the office or send a message through MyChart. You may also schedule an earlier appointment if necessary.  Additionally, you may be receiving a survey about your experience at our office within a few days to 1 week by e-mail or mail. We value your feedback.  Saralyn Pilar, DO [place], [place]

## 2023-02-09 ENCOUNTER — Ambulatory Visit: Payer: Self-pay | Admitting: *Deleted

## 2023-02-09 ENCOUNTER — Encounter: Payer: Self-pay | Admitting: Family Medicine

## 2023-02-09 NOTE — Telephone Encounter (Signed)
Message from Phill Myron sent at 02/09/2023  3:18 PM EDT  Summary: Question UTI   Frequent urination, pain and burning when urinating, cloudy urine, unusual smell          Call History  Contact Date/Time Type Contact Phone/Fax User  02/09/2023 03:16 PM EDT Phone (Incoming) [name], Shirley [place] (Self) [phone] Rexene EdisonLillia Mountain E   Reason for Disposition  Age > 50 years  Answer Assessment - Initial Assessment Questions 1. SEVERITY: "How bad is the pain?"  (e.g., Scale 1-10; mild, moderate, or severe)   - MILD (1-3): complains slightly about urination hurting   - MODERATE (4-7): interferes with normal activities     - SEVERE (8-10): excruciating, unwilling or unable to urinate because of the pain      I'm having frequent urination, pain and burning when urinating, cloudy urine and unusual smell.   I don't have time to come in.    A couple a weeks ago at the urgent care they found blood in my urine.  I was there for a tick bite. 2. FREQUENCY: "How many times have you had painful urination today?"      Every time I pee.   I meant to call y'all after I went to the urgent care a couple of weeks ago and they found blood in my urine but I forgot. 3. PATTERN: "Is pain present every time you urinate or just sometimes?"      Every time 4. ONSET: "When did the painful urination start?"      Urgent care found blood in my urine a couple of weeks ago. 5. FEVER: "Do you have a fever?" If Yes, ask: "What is your temperature, how was it measured, and when did it start?"     No 6. PAST UTI: "Have you had a urine infection before?" If Yes, ask: "When was the last time?" and "What happened that time?"      Yes many times 7. CAUSE: "What do you think is causing the painful urination?"  (e.g., UTI, scratch, Herpes sore)     UTI 8. OTHER SYMPTOMS: "Do you have any other symptoms?" (e.g., blood in urine, flank pain, genital sores, urgency, vaginal discharge)     My back is hurting along  with the symptoms mentioned above. 9. PREGNANCY: "Is there any chance you are pregnant?" "When was your last menstrual period?"     Not asked  Protocols used: Urination Pain - Female-A-AH

## 2023-02-09 NOTE — Telephone Encounter (Signed)
  Chief Complaint: UTI Symptoms: Cloudy urine with an odor, frequency, burning.   Blood was in urine 2 weeks ago at the urgent care. Frequency: Last few days getting worse. Pertinent Negatives: Patient denies Fever Disposition: [] ED /[] Urgent Care (no appt availability in office) / [x] Appointment(In office/virtual)/ []  [place] [facility]/ [] Home Care/ [] Refused Recommended Disposition /[] [place] [facility]/ []  Follow-up with PCP Additional Notes: I had a difficult time triaging her as she was jumping from one subject to another.   I continually had to get her focused back on why she was calling.   She could not make up her mind if she wanted to go to the urgent care or take the appt. For Nicki Reaper, NP on 02/11/2023 at 3:00.     I finally had to ask her to call us back and let us know something after sitting there with her for 5 minutes as she went back and forth trying to make a decision regarding the urgent care or [facility] and talking about other things in between.   She was agreeable to this plan and will call us back if she decides not to go on to the urgent care.

## 2023-02-09 NOTE — Telephone Encounter (Signed)
 See My Chart Message    Thanks,   -Mickel Baas

## 2023-02-10 ENCOUNTER — Encounter: Payer: Self-pay | Admitting: Family Medicine

## 2023-02-10 ENCOUNTER — Ambulatory Visit: Payer: Medicaid Other | Admitting: Family Medicine

## 2023-02-10 VITALS — BP 104/68 | HR 67 | Temp 98.4°F | Ht 66.0 in | Wt 179.0 lb

## 2023-02-10 DIAGNOSIS — N3010 Interstitial cystitis (chronic) without hematuria: Secondary | ICD-10-CM | POA: Diagnosis not present

## 2023-02-10 DIAGNOSIS — R300 Dysuria: Secondary | ICD-10-CM | POA: Diagnosis not present

## 2023-02-10 DIAGNOSIS — N3011 Interstitial cystitis (chronic) with hematuria: Secondary | ICD-10-CM | POA: Diagnosis not present

## 2023-02-10 DIAGNOSIS — H1032 Unspecified acute conjunctivitis, left eye: Secondary | ICD-10-CM

## 2023-02-10 LAB — POCT URINALYSIS DIPSTICK
Bilirubin, UA: NEGATIVE
Blood, UA: POSITIVE
Glucose, UA: NEGATIVE
Ketones, UA: NEGATIVE
Leukocytes, UA: NEGATIVE
Nitrite, UA: NEGATIVE
Protein, UA: NEGATIVE
Spec Grav, UA: 1.01 (ref 1.010–1.025)
Urobilinogen, UA: 0.2 E.U./dL
pH, UA: 5.5 (ref 5.0–8.0)

## 2023-02-10 MED ORDER — FOSFOMYCIN TROMETHAMINE 3 G PO PACK
3.0000 g | PACK | Freq: Once | ORAL | 0 refills | Status: AC
Start: 2023-02-10 — End: 2023-02-10

## 2023-02-10 MED ORDER — ERYTHROMYCIN 5 MG/GM OP OINT
TOPICAL_OINTMENT | OPHTHALMIC | 1 refills | Status: DC
Start: 2023-02-10 — End: 2023-07-30

## 2023-02-10 NOTE — Patient Instructions (Addendum)
Thank you for coming to the office today.  Urine shows blood.  Rx Fosfomycin ONLY if needed. Right now don't take it and wait to see how you do.  Call Urology to schedule  Results for orders placed or performed in visit on 02/10/23  POCT urinalysis dipstick  Result Value Ref Range   Color, UA     Clarity, UA     Glucose, UA Negative Negative   Bilirubin, UA neg    Ketones, UA neg    Spec Grav, UA 1.010 1.010 - 1.025   Blood, UA positive    pH, UA 5.5 5.0 - 8.0   Protein, UA Negative Negative   Urobilinogen, UA 0.2 0.2 or 1.0 E.U./dL   Nitrite, UA neg    Leukocytes, UA Negative Negative   Appearance     Odor      Please call Dr Legrand Rams office to schedule with him.  [facility] [place] [address] [place], [place] [address] [phone]  [facility] [place] [address] [place], [place] [address] [phone]  ---  Erythromycin Eye Ointment. Rx for Pink Eye   Please schedule a Follow-up Appointment to: Return if symptoms worsen or fail to improve.  If you have any other questions or concerns, please feel free to call the office or send a message through MyChart. You may also schedule an earlier appointment if necessary.  Additionally, you may be receiving a survey about your experience at our office within a few days to 1 week by e-mail or mail. We value your feedback.  Saralyn Pilar, DO [facility], [place]

## 2023-02-10 NOTE — Progress Notes (Unsigned)
Subjective:    Patient ID: Shirley [name], female    DOB: [birth year], 52 y.o.   MRN: [id number]  Shirley [name] is a 52 y.o. female presenting on 02/10/2023 for Dysuria (Urgency a few days)  Patient presents for a same day appointment.  HPI  Chronic Interstitial Cystitis / Chronic Bladder Dysfunction Hematuria  Cloudy urine with an odor, frequency, burning. Blood was in urine 2 weeks ago at the urgent care. 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  Prior dx urinary retention in past history She does push to strain w/ urination Issue with bladder stretched out   Acute Conjunctivitis Last treated 12/10/22, Erythromycin eye ointment Similar this time, woke up with drainage from eyes      02/03/2023    3:04 PM 12/04/2022   12:41 PM 10/14/2022    4:08 PM  Depression screen PHQ 2/9  Decreased Interest 1 3 3   Down, Depressed, Hopeless 1 2 3   PHQ - 2 Score 2 5 6   Altered sleeping 1 3 3   Tired, decreased energy 1 3 3   Change in appetite 1 2 3   Feeling bad or failure about yourself  1 3 3   Trouble concentrating 1 1 0  Moving slowly or fidgety/restless 0 3 3  Suicidal thoughts 1 0 0  PHQ-9 Score 8 20 21   Difficult doing work/chores Somewhat difficult Somewhat difficult Extremely dIfficult    Social History   Tobacco Use   Smoking status: Never    Passive exposure: Never   Smokeless tobacco: Never  Vaping Use   Vaping status: Never Used  Substance Use Topics   Alcohol use: Not Currently   Drug use: Never    Review of Systems Per HPI unless specifically indicated above     Objective:    BP 104/68   Pulse 67   Temp 98.4 F (36.9 C) (Oral)   Ht 5\' 6"  (1.676 m)   Wt 179 lb (81.2 kg)   LMP  (LMP Unknown) Comment: last cycle over a year ago  SpO2 97%   BMI 28.89 kg/m   Wt Readings from Last 3 Encounters:  02/10/23 179 lb (81.2 kg)  02/03/23 181 lb 6.4 oz (82.3 kg)  01/24/23  180 lb 12.4 oz (82 kg)    Physical Exam Vitals and nursing note reviewed.  Constitutional:      General: She is not in acute distress.    Appearance: Normal appearance. She is well-developed. She is not diaphoretic.     Comments: Well-appearing, comfortable, cooperative  HENT:     Head: Normocephalic and atraumatic.  Eyes:     General:        Right eye: No discharge.        Left eye: No discharge.     Comments: Mild conjunctival injection without drainage  Cardiovascular:     Rate and Rhythm: Normal rate.  Pulmonary:     Effort: Pulmonary effort is normal.  Skin:    General: Skin is warm and dry.     Findings: No erythema or rash.  Neurological:     Mental Status: She is alert and oriented to person, place, and time.  Psychiatric:        Mood and Affect: Mood normal.        Behavior: Behavior normal.        Thought Content: Thought content normal.     Comments: Well groomed, good eye  contact, normal speech and thoughts       Results for orders placed or performed in visit on 02/10/23  POCT urinalysis dipstick  Result Value Ref Range   Color, UA     Clarity, UA     Glucose, UA Negative Negative   Bilirubin, UA neg    Ketones, UA neg    Spec Grav, UA 1.010 1.010 - 1.025   Blood, UA positive    pH, UA 5.5 5.0 - 8.0   Protein, UA Negative Negative   Urobilinogen, UA 0.2 0.2 or 1.0 E.U./dL   Nitrite, UA neg    Leukocytes, UA Negative Negative   Appearance     Odor        Assessment & Plan:   Problem List Items Addressed This Visit     Dysuria   Relevant Orders   POCT urinalysis dipstick (Completed)   Interstitial cystitis (chronic) with hematuria - Primary   Other Visit Diagnoses     Acute conjunctivitis of left eye, unspecified acute conjunctivitis type       Relevant Medications   erythromycin ophthalmic ointment   Interstitial cystitis (chronic) without hematuria       Relevant Medications   fosfomycin (MONUROL) 3 g PACK       Chronic  Interstitial Cystitis No acute cystitis identified Blood on urinary dipstick, chronoic problem Otherwise asymptomatic  Rx Fosfomycin ONLY if needed. Right now don't take it and wait to see how you do.  Call Urology to schedule w Dr Richardo Hanks as discussed this was previously set up but she did not contact them to schedule.  #acute conjunctivitis Rx Erythromycin opht ointment.  Meds ordered this encounter  Medications   erythromycin ophthalmic ointment    Sig: Place 1 Application into both eyes 4 (four) times daily. For 7-10 day    Dispense:  3.5 g    Refill:  1   fosfomycin (MONUROL) 3 g PACK    Sig: Take 3 g by mouth once for 1 dose.    Dispense:  1 each    Refill:  0    DX Code Needed  . N30.10     Follow up plan: Return if symptoms worsen or fail to improve.   Saralyn Pilar, DO [facility] 02/10/2023, 3:43 PM

## 2023-02-13 ENCOUNTER — Ambulatory Visit: Payer: Self-pay | Admitting: *Deleted

## 2023-02-13 NOTE — Telephone Encounter (Signed)
  Chief Complaint: pulse in 40s and 50s and BP going up and down. Symptoms: lightheaded briefly for one episode.   Was very vague in her descriptions and gave me various reasons why she could not come in today. Frequency: Today Pertinent Negatives: Patient denies other symptoms Disposition: [] ED /[] Urgent Care (no appt availability in office) / [x] Appointment(In office/virtual)/ []  [place] [facility]/ [] Home Care/ [] Refused Recommended Disposition /[] [place] [facility]/ []  Follow-up with PCP Additional Notes: Pt is going to talk with her son when he wakes up at 12:00 and call us back and let us know if she can come to the appt today at 2:4 with Nicki Reaper, NP.    I did let her know that appt may be taken by that time.    "I'll probably just go to the emergency room this afternoon".    Gave various reasons why she could not come in today and was vague with answering the triage questions.  I sent my notes to Dr. Althea Charon for his information.

## 2023-02-13 NOTE — Telephone Encounter (Signed)
Reason for Disposition  Heart rhythm alert (e.g., "you have irregular heartbeat") from personal wearable device (e.g., Apple Watch)    Pt called in c/o her pulse being in 40s and 50s with a fluctuating BP.   Only compliant was feeling light headed briefly on one occasion.   Vague in her descriptions and had various reasons for not being able to come in today for an appt.  Answer Assessment - Initial Assessment Questions 1. DESCRIPTION: "Please describe your heart rate or heartbeat that you are having" (e.g., fast/slow, regular/irregular, skipped or extra beats, "palpitatons")     Pt called in c/o her pulse being low in the 40s and 50s and her BP going up and down. I called EMS last night and I talked with a nurse during the night.  My pulse was 45 when the EMS came.  My face gets flushed.   "I don't usually take my BP and pulse".    "I was walking in [employer] and my face felt flushed".  My pulse is 45 during the day and in the 50s at night.    My BP is going up and down.    "I don't know what to do."  I offered her an appt for today with Nicki Reaper, NP for 2:40.   "I don't know if I can make it".    "My son has to bring me".    "He sleeps until 12:00 every day".    "I can't wake him".    (She was talking to someone about taking her to the doctor today, but there was no response from son, "I can't wake him".      "I don't know".       I ask her if she wanted to check with him first and then call us back as she was giving me various reasons  why she could not come in today.   She was agreeable to calling us back after talking with her son when he wakes up at 12:00.    She was not c/o any other symptoms other than her face was flushed at one point.    She was changing subjects and telling me various reasons why she could not come it.     "I'm not going to drive".    "I can't drive with my pulse being so low".    "I think I'll go to the emergency room later today".    "I'll call back if I decided  to take that appt if my son will bring me".    I let her know that was the last appt available today so to call as soon as possible because there is a good chance that appt will be taken.     She was agreeable to calling back after talking with her son or going to the ED this afternoon when he could take her.  ONSET: "When did it start?" (Minutes, hours or days)      Yesterday and last night and today 3. DURATION: "How long does it last" (e.g., seconds, minutes, hours)     Pulse in 40s and 50s. 4. PATTERN "Does it come and go, or has it been constant since it started?"  "Does it get worse with exertion?"   "Are you feeling it now?"     BP intermittently is up and down. 5. TAP: "Using your hand, can you tap out what you are feeling on a chair or table in front of you,  so that I can hear?" (Note: not all patients can do this)       Not asked 6. HEART RATE: "Can you tell me your heart rate?" "How many beats in 15 seconds?"  (Note: not all patients can do this)       45 when EMS came last night 7. RECURRENT SYMPTOM: "Have you ever had this before?" If Yes, ask: "When was the last time?" and "What happened that time?"      No 8. CAUSE: "What do you think is causing the palpitations?"     I don't know 9. CARDIAC HISTORY: "Do you have any history of heart disease?" (e.g., heart attack, angina, bypass surgery, angioplasty, arrhythmia)      I've seen a cardiologist before.   Maybe I need to see him again 10. OTHER SYMPTOMS: "Do you have any other symptoms?" (e.g., dizziness, chest pain, sweating, difficulty breathing)       C/o one time feeling briefly lightheaded.     11. PREGNANCY: "Is there any chance you are pregnant?" "When was your last menstrual period?"       Not asked  Protocols used: Heart Rate and Heartbeat Questions-A-AH

## 2023-02-14 ENCOUNTER — Other Ambulatory Visit: Payer: Self-pay | Admitting: Family Medicine

## 2023-02-14 DIAGNOSIS — I10 Essential (primary) hypertension: Secondary | ICD-10-CM

## 2023-02-16 NOTE — Telephone Encounter (Signed)
Unable to refill per protocol, Rx request is too soon.  Requested Prescriptions  Pending Prescriptions Disp Refills   valsartan (DIOVAN) 160 MG tablet [Pharmacy Med Name: VALSARTAN 160 MG TABLET] 90 tablet 1    Sig: TAKE 1 TABLET BY MOUTH EVERY DAY     Cardiovascular:  Angiotensin Receptor Blockers Failed - 02/14/2023  6:44 PM      Failed - K in normal range and within 180 days    Potassium  Date Value Ref Range Status  01/24/2023 3.3 (L) 3.5 - 5.1 mmol/L Final  04/18/2012 3.1 (L) 3.5 - 5.1 mmol/L Final         Passed - Cr in normal range and within 180 days    Creat  Date Value Ref Range Status  04/17/2022 0.74 0.50 - 1.03 mg/dL Final   Creatinine, Ser  Date Value Ref Range Status  01/24/2023 0.80 0.44 - 1.00 mg/dL Final         Passed - Patient is not pregnant      Passed - Last BP in normal range    BP Readings from Last 1 Encounters:  02/10/23 104/68         Passed - Valid encounter within last 6 months    Recent Outpatient Visits           6 days ago Interstitial cystitis (chronic) with hematuria   [facility] [facility] Smitty Cords, DO   1 week ago Tick bite of right forearm, initial encounter   [place] [facility] Smitty Cords, DO   3 weeks ago Insect bite of right forearm, initial encounter   [place] [facility] Mecum, Oswaldo Conroy, PA-C   3 weeks ago PTSD (post-traumatic stress disorder)   [place] [facility] Smitty Cords, DO   2 months ago Acute conjunctivitis of left eye, unspecified acute conjunctivitis type   [place] [facility] Althea Charon, [name], DO       Future Appointments             In 1 month Althea Charon, [name], DO [place] [facility]

## 2023-02-24 ENCOUNTER — Other Ambulatory Visit: Payer: Self-pay | Admitting: Family Medicine

## 2023-02-24 DIAGNOSIS — J309 Allergic rhinitis, unspecified: Secondary | ICD-10-CM

## 2023-02-25 ENCOUNTER — Emergency Department: Payer: Medicaid Other

## 2023-02-25 ENCOUNTER — Ambulatory Visit: Payer: Self-pay | Admitting: *Deleted

## 2023-02-25 ENCOUNTER — Encounter: Payer: Self-pay | Admitting: Emergency Medicine

## 2023-02-25 ENCOUNTER — Other Ambulatory Visit: Payer: Self-pay

## 2023-02-25 ENCOUNTER — Emergency Department
Admission: EM | Admit: 2023-02-25 | Discharge: 2023-02-25 | Disposition: A | Discharge status: Home / Self Care | Payer: Medicaid Other | Attending: Emergency Medicine | Admitting: Emergency Medicine

## 2023-02-25 ENCOUNTER — Encounter: Payer: Self-pay | Admitting: Family Medicine

## 2023-02-25 DIAGNOSIS — R1084 Generalized abdominal pain: Secondary | ICD-10-CM | POA: Diagnosis not present

## 2023-02-25 DIAGNOSIS — R197 Diarrhea, unspecified: Secondary | ICD-10-CM | POA: Diagnosis not present

## 2023-02-25 DIAGNOSIS — R110 Nausea: Secondary | ICD-10-CM | POA: Diagnosis not present

## 2023-02-25 DIAGNOSIS — R1031 Right lower quadrant pain: Secondary | ICD-10-CM | POA: Diagnosis not present

## 2023-02-25 DIAGNOSIS — R300 Dysuria: Secondary | ICD-10-CM | POA: Diagnosis not present

## 2023-02-25 DIAGNOSIS — R350 Frequency of micturition: Secondary | ICD-10-CM | POA: Diagnosis not present

## 2023-02-25 DIAGNOSIS — R42 Dizziness and giddiness: Secondary | ICD-10-CM | POA: Insufficient documentation

## 2023-02-25 DIAGNOSIS — I10 Essential (primary) hypertension: Secondary | ICD-10-CM | POA: Diagnosis not present

## 2023-02-25 DIAGNOSIS — R1011 Right upper quadrant pain: Secondary | ICD-10-CM | POA: Diagnosis not present

## 2023-02-25 DIAGNOSIS — R109 Unspecified abdominal pain: Secondary | ICD-10-CM | POA: Diagnosis present

## 2023-02-25 LAB — COMPREHENSIVE METABOLIC PANEL
ALT: 15 U/L (ref 0–44)
AST: 17 U/L (ref 15–41)
Albumin: 4.3 g/dL (ref 3.5–5.0)
Alkaline Phosphatase: 85 U/L (ref 38–126)
Anion gap: 9 (ref 5–15)
BUN: 13 mg/dL (ref 6–20)
CO2: 23 mmol/L (ref 22–32)
Calcium: 9.3 mg/dL (ref 8.9–10.3)
Chloride: 109 mmol/L (ref 98–111)
Creatinine, Ser: 0.78 mg/dL (ref 0.44–1.00)
GFR, Estimated: >60 mL/min (ref >60)
Glucose, Bld: 125 mg/dL — ABNORMAL HIGH (ref 70–99)
Potassium: 3.4 mmol/L — ABNORMAL LOW (ref 3.5–5.1)
Sodium: 141 mmol/L (ref 135–145)
Total Bilirubin: 0.4 mg/dL (ref 0.3–1.2)
Total Protein: 7.4 g/dL (ref 6.5–8.1)

## 2023-02-25 LAB — URINALYSIS, ROUTINE W REFLEX MICROSCOPIC
Bacteria, UA: NONE SEEN
Bilirubin Urine: NEGATIVE
Glucose, UA: NEGATIVE mg/dL
Ketones, ur: NEGATIVE mg/dL
Leukocytes,Ua: NEGATIVE
Nitrite: NEGATIVE
Protein, ur: NEGATIVE mg/dL
Specific Gravity, Urine: 1.001 — ABNORMAL LOW (ref 1.005–1.030)
Squamous Epithelial / HPF: NONE SEEN /HPF (ref 0–5)
pH: 6 (ref 5.0–8.0)

## 2023-02-25 LAB — CBC WITH DIFFERENTIAL/PLATELET
Abs Immature Granulocytes: 0.02 10*3/uL (ref 0.00–0.07)
Basophils Absolute: 0.1 10*3/uL (ref 0.0–0.1)
Basophils Relative: 1 %
Eosinophils Absolute: 0.3 10*3/uL (ref 0.0–0.5)
Eosinophils Relative: 4 %
HCT: 37.3 % (ref 36.0–46.0)
Hemoglobin: 12.6 g/dL (ref 12.0–15.0)
Immature Granulocytes: 0 %
Lymphocytes Relative: 40 %
Lymphs Abs: 3 10*3/uL (ref 0.7–4.0)
MCH: 28.6 pg (ref 26.0–34.0)
MCHC: 33.8 g/dL (ref 30.0–36.0)
MCV: 84.6 fL (ref 80.0–100.0)
Monocytes Absolute: 0.6 10*3/uL (ref 0.1–1.0)
Monocytes Relative: 9 %
Neutro Abs: 3.4 10*3/uL (ref 1.7–7.7)
Neutrophils Relative %: 46 %
Platelets: 257 10*3/uL (ref 150–400)
RBC: 4.41 MIL/uL (ref 3.87–5.11)
RDW: 12.6 % (ref 11.5–15.5)
WBC: 7.4 10*3/uL (ref 4.0–10.5)
nRBC: 0 % (ref 0.0–0.2)

## 2023-02-25 LAB — LIPASE, BLOOD: Lipase: 50 U/L (ref 11–51)

## 2023-02-25 MED ORDER — ONDANSETRON 4 MG PO TBDP: 4.0000 mg | ORAL_TABLET | Freq: Three times a day (TID) | ORAL | 0 refills | Status: DC | PRN

## 2023-02-25 NOTE — ED Triage Notes (Addendum)
Pt to triage via w/c with no distress noted; reports abd pain/bloating accomp by nausea and diarrhea since last night; pt reports taking imodium and xanax PTA

## 2023-02-25 NOTE — ED Notes (Signed)
Assisted pt back from restroom- pt states that she feels like she's going to pass out.

## 2023-02-25 NOTE — Telephone Encounter (Signed)
  Chief Complaint: In ED at [facility] and called in to c/o the wait because she had been waiting 2 hours to be seen.   She asked if there were any appts at the office and there are not.   I advised her to stay there to be evaluated. Symptoms: Abd pain Frequency: not asked since in the ED Pertinent Negatives: Patient denies N/A Disposition: [x] ED /[] Urgent Care (no appt availability in office) / [] Appointment(In office/virtual)/ []  [place] [facility]/ [] Home Care/ [] Refused Recommended Disposition /[] [place] [facility]/ []  Follow-up with PCP Additional Notes: Message sent to Lutricia Horsfall for their information.

## 2023-02-25 NOTE — Telephone Encounter (Signed)
Pt is calling from [facility] now.   I came in with belly swollen.    I'm in the bathroom here at the hospital.      I'm having pain and abd swelling.   I've been here 2 hours and they are not treating me.    There is only 2 people here.   I don't know why they won't see me.  I checked with Nicki Reaper and Dr Althea Charon and they don't have any appts today.  She started telling about her son being sleepy, her insurance company telling her she can't come to the ED, etc.   I finally had to let her know that we can't do anything about how the ED operates and that since she is there she should stay there and let them evaluate her.    She started telling me about her son again and that there are only 2 people here in the ED and she doesn't understand why no one is treating her.  I let her know to stay there and be evaluated.   She said,  "ok and hung up.   Reason for Disposition  [1] Caller requesting NON-URGENT health information AND [2] PCP's office is the best resource  Answer Assessment - Initial Assessment Questions 1. REASON FOR CALL or QUESTION: "What is your reason for calling today?" or "How can I best help you?" or "What question do you have that I can help answer?"     Pt in ED at [facility] and wanting to know if there are appts today at the practice.  She has been waiting 2 hours at the ED.     No appts available today with either provider.   I advised her to stay in the ED and let them evaluate her since she is there.   I let her know the office can't do anything about the wait or how the ED operates.   She was c/o being there 2 hours and still waiting.  Protocols used: Information Only Call - No Triage-A-AH

## 2023-02-25 NOTE — Telephone Encounter (Signed)
Requested Prescriptions  Pending Prescriptions Disp Refills   cetirizine (ZYRTEC) 10 MG tablet [Pharmacy Med Name: CETIRIZINE HCL 10 MG TABLET] 90 tablet 0    Sig: TAKE 1 TABLET BY MOUTH EVERY DAY     Ear, Nose, and Throat:  Antihistamines 2 Passed - 02/24/2023  7:11 PM      Passed - Cr in normal range and within 360 days    Creat  Date Value Ref Range Status  04/17/2022 0.74 0.50 - 1.03 mg/dL Final   Creatinine, Ser  Date Value Ref Range Status  02/25/2023 0.78 0.44 - 1.00 mg/dL Final         Passed - Valid encounter within last 12 months    Recent Outpatient Visits           2 weeks ago Interstitial cystitis (chronic) with hematuria   [place] [facility], [name], DO   3 weeks ago Tick bite of right forearm, initial encounter   [place] [facility] [place], [name], DO   1 month ago Insect bite of right forearm, initial encounter   [place] [facility] Mecum, Oswaldo Conroy, PA-C   1 month ago PTSD (post-traumatic stress disorder)   [place] [facility] Smitty Cords, DO   2 months ago Acute conjunctivitis of left eye, unspecified acute conjunctivitis type   [place] [facility] Althea Charon, [name], DO       Future Appointments             In 5 days [place], Elana Alm [facility] [place]

## 2023-02-25 NOTE — ED Provider Notes (Signed)
[facility] Provider Note    Event Date/Time   First MD Initiated Contact with Patient 02/25/23 0840     (approximate)   History   Chief Complaint: Abdominal Pain   HPI  Shirley [name] is a 52 y.o. female with a past history of anxiety, hypertension, ovarian cyst who comes the ED complaining of right-sided abdominal pain and bloating along with nausea and diarrhea that started yesterday.  Started after eating at [employer].  She reports her son also felt sick afterward.  He had fast heart rate, but no GI symptoms.  She reports having frequent episodes of diarrhea throughout the night.  Nonbloody.  Has been able to eat crackers and drink fluids but feels dizzy with standing.  No chest pain or shortness of breath, no fever.  Patient also endorses dysuria and frequency.     Physical Exam   Triage Vital Signs: ED Triage Vitals  Encounter Vitals Group     BP 02/25/23 0638 (!) 158/84     Systolic BP Percentile --      Diastolic BP Percentile --      Pulse Rate 02/25/23 0638 97     Resp 02/25/23 0638 16     Temp 02/25/23 0638 98.2 F (36.8 C)     Temp Source 02/25/23 0638 Oral     SpO2 02/25/23 0638 98 %     Weight 02/25/23 0615 180 lb (81.6 kg)     Height 02/25/23 0615 5\' 6"  (1.676 m)     Head Circumference --      Peak Flow --      Pain Score 02/25/23 0615 10     Pain Loc --      Pain Education --      Exclude from Growth Chart --     Most recent vital signs: Vitals:   02/25/23 0638  BP: (!) 158/84  Pulse: 97  Resp: 16  Temp: 98.2 F (36.8 C)  SpO2: 98%    General: Awake, no distress.  CV:  Good peripheral perfusion.  Regular rate rhythm Resp:  Normal effort.  Clear to auscultation bilaterally Abd:  No distention.  Pronounced tenderness in right upper quadrant.  Tenderness in the right lower quadrant as well. Other:  Dry oral mucosa   ED Results / Procedures / Treatments   Labs (all labs ordered are listed, but only abnormal results  are displayed) Labs Reviewed  COMPREHENSIVE METABOLIC PANEL - Abnormal; Notable for the following components:      Result Value   Potassium 3.4 (*)    Glucose, Bld 125 (*)    All other components within normal limits  URINALYSIS, ROUTINE W REFLEX MICROSCOPIC - Abnormal; Notable for the following components:   Color, Urine COLORLESS (*)    APPearance CLEAR (*)    Specific Gravity, Urine 1.001 (*)    Hgb urine dipstick SMALL (*)    All other components within normal limits  CBC WITH DIFFERENTIAL/PLATELET  LIPASE, BLOOD     EKG Interpreted by me Sinus rhythm rate of 80.  Normal axis intervals QRS ST segments T waves   RADIOLOGY [place] abd interpreted by me, negative for gallstones or signs of cholecystitis. Radiolgoy report reviewed  CT abd/pelvis negative.    PROCEDURES:  Procedures   MEDICATIONS ORDERED IN ED: Medications - No data to display   IMPRESSION / MDM / ASSESSMENT AND PLAN / ED COURSE  I reviewed the triage vital signs and the nursing notes.  DDx:  Dehydration, AKI, anemia, UTI, gastroenteritis, cholecystitis, pancreatitis, appendicitis  Patient's presentation is most consistent with acute presentation with potential threat to life or bodily function.  Patient presents with abdominal pain and GI symptoms since yesterday.  Concerning exam.  Vital signs unremarkable.  Initial serum labs and urinalysis are normal.  Will obtain ultrasound right upper quadrant, if nondiagnostic may need to proceed with CT abdomen pelvis.  Recommended IV fluids and medications for hydration and symptom relief, patient refuses, stating she does not like needles. ----------------------------------------- 12:30 PM on 02/25/2023 ----------------------------------------- Feeling better, tolerating PO. [place] and CT and labs negative. Stable for DC. Suspect food borne illness / viral age.       FINAL CLINICAL IMPRESSION(S) / ED DIAGNOSES   Final diagnoses:  Generalized abdominal pain      Rx / DC Orders   ED Discharge Orders          Ordered    ondansetron (ZOFRAN-ODT) 4 MG disintegrating tablet  Every 8 hours PRN        02/25/23 1229             Note:  This document was prepared using Dragon voice recognition software and may include unintentional dictation errors.   Sharman Cheek, MD 02/25/23 [phone]

## 2023-02-25 NOTE — ED Notes (Signed)
Pt to first nurse desk asking about wait time stating "Shirley [name] been here a long time and I wouldn't be here unless I was really sick" Pt feelings acknowledged and pt made aware of the bed situation and pt was asked if there is anything that can be done to make her waiting room time more comfortable pt denies anything other "than getting to a room".

## 2023-03-02 ENCOUNTER — Ambulatory Visit: Payer: Medicaid Other | Admitting: Urology

## 2023-03-02 ENCOUNTER — Ambulatory Visit: Payer: Self-pay

## 2023-03-02 NOTE — Telephone Encounter (Signed)
Pt is calling to report that she is concerned about elevated blood sugars and bp. Unable to get in for a hospital follow up. Please advse    Chief Complaint: Urinary frequency. Asking to be worked in this week. No availability. Symptoms: Above, also having anxiety related to "living situation."  Frequency: 1-2 weeks Pertinent Negatives: Patient denies fever Disposition: [] ED /[] Urgent Care (no appt availability in office) / [] Appointment(In office/virtual)/ []  [place] [facility]/ [] Home Care/ [] Refused Recommended Disposition /[] [facility] [facility]/ [x]  Follow-up with PCP Additional Notes: Please advise pt.  Reason for Disposition  Urinating more frequently than usual (i.e., frequency)  Answer Assessment - Initial Assessment Questions 1. SYMPTOM: "What's the main symptom you're concerned about?" (e.g., frequency, incontinence)     Frequency 2. ONSET: "When did the    start?"     1-2 weeks 3. PAIN: "Is there any pain?" If Yes, ask: "How bad is it?" (Scale: 1-10; mild, moderate, severe)     Mild 4. CAUSE: "What do you think is causing the symptoms?"     UTI 5. OTHER SYMPTOMS: "Do you have any other symptoms?" (e.g., blood in urine, fever, flank pain, pain with urination)     No 6. PREGNANCY: "Is there any chance you are pregnant?" "When was your last menstrual period?"     No  Protocols used: Urinary Symptoms-A-AH

## 2023-03-02 NOTE — Telephone Encounter (Signed)
Pt needs to schedule an office visit.  Dr. Althea Charon is out of the office this week and does not have any appointments for a few weeks.  PEC when pt calls back please schedule her with Denny Peon Mecum, PA-C.    Thanks,   -Vernona Rieger

## 2023-03-06 ENCOUNTER — Encounter: Payer: Self-pay | Admitting: Physician Assistant

## 2023-03-06 ENCOUNTER — Ambulatory Visit: Payer: Medicaid Other | Admitting: Physician Assistant

## 2023-03-06 ENCOUNTER — Telehealth: Payer: Self-pay | Admitting: Family Medicine

## 2023-03-06 ENCOUNTER — Inpatient Hospital Stay: Payer: Medicaid Other | Admitting: Physician Assistant

## 2023-03-06 VITALS — BP 113/71 | HR 76 | Ht 66.0 in | Wt 176.8 lb

## 2023-03-06 DIAGNOSIS — R310 Gross hematuria: Secondary | ICD-10-CM | POA: Diagnosis not present

## 2023-03-06 DIAGNOSIS — R1030 Lower abdominal pain, unspecified: Secondary | ICD-10-CM

## 2023-03-06 DIAGNOSIS — N3281 Overactive bladder: Secondary | ICD-10-CM

## 2023-03-06 LAB — URINALYSIS, ROUTINE W REFLEX MICROSCOPIC
Bilirubin, UA: NEGATIVE
Glucose, UA: NEGATIVE
Ketones, UA: NEGATIVE
Leukocytes,UA: NEGATIVE
Nitrite, UA: NEGATIVE
Protein,UA: NEGATIVE
Specific Gravity, UA: 1.02 (ref 1.005–1.030)
Urobilinogen, Ur: 0.2 mg/dL (ref 0.2–1.0)
pH, UA: 5.5 (ref 5.0–7.5)

## 2023-03-06 LAB — MICROSCOPIC EXAMINATION: Bacteria, UA: NONE SEEN

## 2023-03-06 NOTE — Progress Notes (Signed)
Acute Office Visit   Patient: Shirley [name]   DOB: 1970/09/10   52 y.o. Female  MRN: [id number] Visit Date: 03/06/2023  Today's healthcare provider: Oswaldo Conroy Ehab Humber, PA-C  Introduced myself to the patient as a Secondary school teacher and provided education on APPs in clinical practice.    Chief Complaint  Patient presents with   Abdominal Pain    Patient says she was told that she had Food Poison, when she recently was hospitalized. Patient says she has had 7 Urinary Tract Infections this year. Patient says she noticed blood in urine and says it is always there. Patient says waiting to get in with Urology to follow up.   Hospitalization Follow-up   Subjective    HPI HPI     Abdominal Pain    Additional comments: Patient says she was told that she had Food Poison, when she recently was hospitalized. Patient says she has had 7 Urinary Tract Infections this year. Patient says she noticed blood in urine and says it is always there. Patient says waiting to get in with Urology to follow up.      Last edited by Malen Gauze, CMA on 03/06/2023  9:02 AM.      ED follow up   Patient was evaluated in ED on 02/25/23 for suspected food borne illness with diarrhea and abdominal pain  She was treated with Zofran - reviewed ED visit notes, labs and imaging results  She reports she is feeling better but is concerned about a UTI as her urine is orange and darker than normal today  She states she has been having some lower abdominal cramping and is concerned for repeated UTIs  She states she has to use the restroom almost every 5 minutes     Medications: Outpatient Medications Prior to Visit  Medication Sig   acyclovir (ZOVIRAX) 400 MG tablet Take 1 tablet (400 mg total) by mouth 3 (three) times daily.   albuterol (VENTOLIN HFA) 108 (90 Base) MCG/ACT inhaler TAKE 2 PUFFS BY MOUTH EVERY 6 HOURS AS NEEDED FOR WHEEZE OR SHORTNESS OF BREATH   ALPRAZolam (XANAX) 1 MG tablet Take 1 mg by mouth 3 (three)  times daily as needed for anxiety. 1 mg three times a day and 2 mg at bedtime   amitriptyline (ELAVIL) 50 MG tablet Take 1 tablet (50 mg total) by mouth at bedtime.   cetirizine (ZYRTEC) 10 MG tablet TAKE 1 TABLET BY MOUTH EVERY DAY   DENTAGEL 1.1 % GEL dental gel Place onto teeth daily.   erythromycin ophthalmic ointment Place 1 Application into both eyes 4 (four) times daily. For 7-10 day   fluticasone (FLONASE) 50 MCG/ACT nasal spray SPRAY 2 SPRAYS INTO EACH NOSTRIL EVERY DAY   ondansetron (ZOFRAN-ODT) 4 MG disintegrating tablet Take 1 tablet (4 mg total) by mouth every 8 (eight) hours as needed for nausea or vomiting.   triamcinolone cream (KENALOG) 0.5 % Apply 1 Application topically 3 (three) times daily.   valsartan (DIOVAN) 160 MG tablet Take 1 tablet (160 mg total) by mouth daily.   [DISCONTINUED] fosfomycin (MONUROL) 3 g PACK Take 3 g by mouth once.   Cholecalciferol (VITAMIN D3) 125 MCG (5000 UT) CAPS Take by mouth. (Patient not taking: Reported on 02/03/2023)   No facility-administered medications prior to visit.    Review of Systems  Constitutional:  Negative for chills, fatigue and fever.  Genitourinary:  Positive for difficulty urinating, frequency, hematuria and urgency.  Objective    BP 113/71   Pulse 76   Ht 5\' 6"  (1.676 m)   Wt 176 lb 12.8 oz (80.2 kg)   LMP  (LMP Unknown) Comment: last cycle over a year ago  SpO2 97%   BMI 28.54 kg/m     Physical Exam Vitals reviewed.  Constitutional:      General: She is awake.     Appearance: Normal appearance. She is well-developed and well-groomed.  HENT:     Head: Normocephalic and atraumatic.  Pulmonary:     Effort: Pulmonary effort is normal.  Abdominal:     General: Abdomen is flat.     Palpations: Abdomen is soft.  Skin:    General: Skin is warm and dry.  Neurological:     General: No focal deficit present.     Mental Status: She is alert and oriented to person, place, and time.     GCS: GCS eye  subscore is 4. GCS verbal subscore is 5. GCS motor subscore is 6.  Psychiatric:        Behavior: Behavior is cooperative.       No results found for any visits on 03/06/23.  Assessment & Plan      No follow-ups on file.       Problem List Items Addressed This Visit   None Visit Diagnoses     Gross hematuria    -  Primary Chronic per patient, recurrent  She reports urine has been orange in color and darker than her normal She also reports suprapubic cramping over the last few days  Reviewed her most recent abdominal CT scan with her- reassured her that bladder was normal in appearance and only cysts visualized were along ovaries and appeared stable Will recheck UA today and send for culture due to her concerns for recurrent UTIs She would also like referral placed to Dr. Richardo Hanks for Urology services - referral placed today per preference UA and Culture results to dictate further management - UA was positive for 1+ blood today but no signs of infection  Will still wait for culture for definitive rule out but no abx indicated today  Will defer to Urology for further management     Relevant Orders   Urinalysis, Routine w reflex microscopic   Urine Culture   Ambulatory referral to Urology   Lower abdominal pain     She reports nausea and diarrhea have subsided and she is feeling better after her ED visit  Reviewed ED visit notes, labs and imaging results from visit on 02/25/23  Follow up as needed for persistent or progressing symptoms     Relevant Orders   Ambulatory referral to Urology   Overactive bladder       Relevant Orders   Ambulatory referral to Urology        No follow-ups on file.   I, Reford Olliff E Ashanty Coltrane, PA-C, have reviewed all documentation for this visit. The documentation on 03/06/23 for the exam, diagnosis, procedures, and orders are all accurate and complete.   Jacquelin Hawking, MHS, PA-C [facility]

## 2023-03-06 NOTE — Progress Notes (Signed)
Your urine does not show signs of active UTI at this time but there is blood present. We will wait on the culture for more definitive rule out but no medication is needed at this time.

## 2023-03-06 NOTE — Telephone Encounter (Signed)
Copied from CRM [id number]. Topic: General - Other >> Mar 06, 2023  1:25 PM Ja-Kwan M wrote: Reason for CRM: Pt had an appt with Denny Peon Mecum today and requests call back regarding results of the urinalysis. Cb# [phone]

## 2023-03-09 NOTE — Progress Notes (Signed)
Urine culture was negative for bacterial growth. No antibiotics are warranted at this time

## 2023-03-24 ENCOUNTER — Encounter: Payer: Self-pay | Admitting: Urology

## 2023-03-24 ENCOUNTER — Other Ambulatory Visit: Payer: Self-pay | Admitting: *Deleted

## 2023-03-24 ENCOUNTER — Ambulatory Visit (INDEPENDENT_AMBULATORY_CARE_PROVIDER_SITE_OTHER): Payer: Medicaid Other | Admitting: Urology

## 2023-03-24 ENCOUNTER — Other Ambulatory Visit
Admission: RE | Admit: 2023-03-24 | Discharge: 2023-03-24 | Disposition: A | Discharge status: Home / Self Care | Payer: Medicaid Other | Attending: Urology | Admitting: Urology

## 2023-03-24 VITALS — BP 120/73 | HR 76 | Ht 65.0 in | Wt 176.0 lb

## 2023-03-24 DIAGNOSIS — R102 Pelvic and perineal pain: Secondary | ICD-10-CM

## 2023-03-24 DIAGNOSIS — R350 Frequency of micturition: Secondary | ICD-10-CM

## 2023-03-24 LAB — URINALYSIS, COMPLETE (UACMP) WITH MICROSCOPIC
Bilirubin Urine: NEGATIVE
Glucose, UA: NEGATIVE mg/dL
Ketones, ur: NEGATIVE mg/dL
Nitrite: NEGATIVE
Protein, ur: NEGATIVE mg/dL
Specific Gravity, Urine: 1.01 (ref 1.005–1.030)
pH: 5.5 (ref 5.0–8.0)

## 2023-03-24 MED ORDER — ESTRADIOL 0.1 MG/GM VA CREA: TOPICAL_CREAM | VAGINAL | 11 refills | Status: DC

## 2023-03-24 MED ORDER — GEMTESA 75 MG PO TABS: 1.0000 | ORAL_TABLET | Freq: Every day | ORAL | Status: DC

## 2023-03-24 NOTE — Patient Instructions (Signed)
 Eating Plan for Interstitial Cystitis Interstitial cystitis (IC) is a long-term (chronic) condition that can cause pain and pressure near your bladder. It can also make you have to pee urgently and often. Symptoms may come and go. Certain foods may trigger your symptoms. Learning what foods bother you can help you come up with an eating plan to manage IC. What are tips for following this plan? You may want to work with an expert in healthy eating called a dietitian. They can help you make an eating plan by doing an elimination diet. This diet involves: Making a list of foods that you think trigger your symptoms. You may also want to include foods that often cause symptoms in other people with IC. It may take a few months to find out which foods bother you. Taking those foods out of your diet for about a month. After that month, you can try to have the foods again one at a time to see which ones cause symptoms. Reading food labels Once you know which foods trigger your symptoms, you can avoid them. But it's still a good idea to read food labels. Some foods that cause your symptoms may be ingredients in other foods. These foods may include: Soy. Worcestershire sauce. Vinegar. Alcohol. Artificial sweeteners. Monosodium glutamate. Other triggers may include: Chili peppers. Tomato products. Citrus fruits, flavors, or juices. Shopping Shopping can be hard if many foods trigger your IC. Bring a list of the foods you can't eat with you when you go to the store. You can get an app for your phone that lets you know which foods are safer and which ones you may want to avoid. You can find the app at the [employer] website: ic-network.com Meal planning Plan your meals based on the results of your elimination diet. If you haven't done the diet yet, plan meals based on IC food lists. These lists may be given to you by your health care provider or dietitian. The lists tell you which foods are least and most  likely to cause symptoms. Avoid certain types of food when you go out to eat. These may include: Pizza. [place] food. [place] food. [place] food. General information Do not eat large portions. Drink lots of fluids with your meals. Do not eat foods that are high in sugar, salt, or saturated fat. Choose whole fruits instead of juice. Eat a colorful variety of vegetables. Find ways to manage stress. Get enough exercise. What foods should I eat? For people with IC, the best diet is a balanced one. This means it includes things from all the food groups. Even if you have to avoid certain foods, there are still lots of healthy choices in each group. Below are some foods that may be safest for you to eat: Fruits Bananas. Blueberries and blueberry juice. Melons. Pears. Apples. Dates. Prunes. Raisins. Apricots. Vegetables Asparagus. Avocado. Celery. Beets. Bell peppers. Black olives. Broccoli. Brussels sprouts. Cabbage. Carrots. Cauliflower. Cucumber. Eggplant. Green beans. Potatoes. Radishes. Spinach. Squash. Turnips. Zucchini. Mushrooms. Peas. Grains Oats. Rice. Bran. Oatmeal. Whole wheat bread. Meats and other proteins Beef. Fish and other seafood. Eggs. Nuts. Peanut butter. Pork. Poultry. Lamb. Garbanzo beans. Pinto beans. Dairy Whole or low-fat milk. American, mozzarella, mild cheddar, feta, ricotta, and cream cheeses. The items listed above may not be all the foods and drinks you can have. Talk to a dietitian to learn more. What foods should I avoid? You should avoid any foods that cause symptoms. It's also a good idea to avoid  foods that often cause symptoms in many people with IC. These foods include: Fruits Citrus fruits, such as lemons, limes, oranges, and grapefruit. Cranberries. Strawberries. Pineapple. Kiwi. Vegetables Chili peppers. Onions. Sauerkraut. Tomato and tomato products. Rosita Fire. Grains You don't need to avoid any type of grain unless it causes symptoms. Meats and other  proteins Precooked or cured meats, such as sausages or meat loaves. Soy products. Dairy Chocolate ice cream. Processed cheese. Yogurt. Drinks Alcohol. Chocolate drinks. Coffee. Cranberry juice. Fizzy drinks. Black, green, or herbal tea. Tomato juice. Sports drinks. The items listed above may not be all the foods and drinks you should avoid. Talk to a dietitian to learn more. This information is not intended to replace advice given to you by your health care provider. Make sure you discuss any questions you have with your health care provider. Document Revised: 10/23/2022 Document Reviewed: 10/23/2022 Elsevier Patient Education  2024 [employer].

## 2023-03-24 NOTE — Progress Notes (Signed)
   03/24/2023 3:24 PM   Ayesha Mohair 11-14-1970 [id number]  Reason for visit: Urinary symptoms, pelvic pain  HPI: 52 year old female with significant anxiety and chronic pain, I have seen her previously multiple times for urinary symptoms, and workup is always been benign.  She has never had microscopic hematuria, and PVRs have always been normal.  Urinalysis is benign again today.  I have previously recommended a trial of amitriptyline for her chronic pelvic pain of unclear etiology, as well as pelvic floor physical therapy and she never followed up with them.  She was seen in the ER on 02/25/2023 and had a CT abdomen and pelvis without contrast that was benign from a urologic perspective, and had a stable right adnexal cyst that is followed by GYN.  She has a number of complaints today including urgent and frequent urination with difficulty urinating when she gets to the bathroom, and nocturia.  She has intermittent pelvic and flank pain.  The history is extremely challenging to obtain as she has tangential thought process and is a very poor historian.  I had another very frank conversation with the patient that her evaluation from a urology perspective has always been normal.  Reassurance provided regarding no history of microscopic hematuria, negative urine cultures, normal CT scan, and normal PVRs.  We reviewed the difference between dipstick positive hemoglobin and true microscopic hematuria, and that dipstick positive hemoglobin in the urine does not warrant further evaluation or indicate any abnormal process.  We discussed the complexities of pelvic pain and possible range of etiologies including pelvic floor dysfunction, chronic bladder pain syndrome, and interstitial cystitis.  We reviewed the AUA guidelines that recommend an algorithmic approach to treatment for these patients, and that a trial of different medications and strategies is sometimes needed to find the approach that works  best for each patient's unique situation.  I reinforced the importance of stress management, relaxation, avoiding triggers, and pain management in the approach to pelvic pain.  -Okay to continue amitriptyline, unclear if this has been helpful -Samples of Gemtesa given for OAB symptoms -Feel very strongly her pelvic pain and urinary symptoms are more related to her anxiety, and not an underlying urologic diagnosis.  Referral placed to pelvic floor physical therapy   Sondra Come, MD  [facility] [address] [place], [place] [address] [phone]

## 2023-03-25 ENCOUNTER — Encounter: Payer: Self-pay | Admitting: Family Medicine

## 2023-03-25 ENCOUNTER — Other Ambulatory Visit: Payer: Self-pay

## 2023-03-25 ENCOUNTER — Telehealth: Payer: Self-pay | Admitting: *Deleted

## 2023-03-25 DIAGNOSIS — N3011 Interstitial cystitis (chronic) with hematuria: Secondary | ICD-10-CM

## 2023-03-25 DIAGNOSIS — N958 Other specified menopausal and perimenopausal disorders: Secondary | ICD-10-CM

## 2023-03-25 MED ORDER — PREMARIN 0.625 MG/GM VA CREA
TOPICAL_CREAM | VAGINAL | 12 refills | Status: DC
Start: 2023-03-25 — End: 2024-01-14

## 2023-03-25 NOTE — Telephone Encounter (Signed)
Per pt's insurance Premarin is preferred estrogen cream over Estrace. RX for Premarin sent in.

## 2023-03-25 NOTE — Telephone Encounter (Signed)
Patient calling stating that she was seen 03/24/23 by Dr. Richardo Hanks and will not be coming back. Per pt she read the office notes and wanted to express she did not like the comment "-Feel very strongly her pelvic pain and urinary symptoms are more related to her anxiety, and not an underlying urologic diagnosis"  pt stated she will not be coming back.  Pt wanted a full explanation of her UA and why we where not doing anything about the abnormal results. I tried to explained it was normal. Pt stated her uncle had just died and she was having anxiety.

## 2023-03-26 ENCOUNTER — Other Ambulatory Visit: Payer: Self-pay | Admitting: Family Medicine

## 2023-03-26 DIAGNOSIS — J309 Allergic rhinitis, unspecified: Secondary | ICD-10-CM

## 2023-03-26 DIAGNOSIS — I10 Essential (primary) hypertension: Secondary | ICD-10-CM

## 2023-03-27 NOTE — Telephone Encounter (Signed)
Requested Prescriptions  Pending Prescriptions Disp Refills   fluticasone (FLONASE) 50 MCG/ACT nasal spray [Pharmacy Med Name: FLUTICASONE PROP 50 MCG SPRAY] 48 mL 1    Sig: SPRAY 2 SPRAYS INTO EACH NOSTRIL EVERY DAY     Ear, Nose, and Throat: Nasal Preparations - Corticosteroids Passed - 03/26/2023  3:02 PM      Passed - Valid encounter within last 12 months    Recent Outpatient Visits           3 weeks ago Gross hematuria   [place] [facility] Mecum, Oswaldo Conroy, PA-C   1 month ago Interstitial cystitis (chronic) with hematuria   [place] [facility] [place], [name], DO   1 month ago Tick bite of right forearm, initial encounter   [place] [facility], [name], DO   2 months ago Insect bite of right forearm, initial encounter   [place] [facility] Mecum, Oswaldo Conroy, PA-C   2 months ago PTSD (post-traumatic stress disorder)   [place] [facility], [name], DO               valsartan (DIOVAN) 80 MG tablet [Pharmacy Med Name: VALSARTAN 80 MG TABLET] 90 tablet     Sig: TAKE 1 TABLET BY MOUTH EVERY DAY     Cardiovascular:  Angiotensin Receptor Blockers Failed - 03/26/2023  3:02 PM      Failed - K in normal range and within 180 days    Potassium  Date Value Ref Range Status  02/25/2023 3.4 (L) 3.5 - 5.1 mmol/L Final  04/18/2012 3.1 (L) 3.5 - 5.1 mmol/L Final         Passed - Cr in normal range and within 180 days    Creat  Date Value Ref Range Status  04/17/2022 0.74 0.50 - 1.03 mg/dL Final   Creatinine, Ser  Date Value Ref Range Status  02/25/2023 0.78 0.44 - 1.00 mg/dL Final         Passed - Patient is not pregnant      Passed - Last BP in normal range    BP Readings from Last 1 Encounters:  03/24/23 120/73         Passed - Valid encounter within last 6 months    Recent Outpatient Visits           3 weeks ago Gross hematuria   [place]  [facility] Mecum, Erin E, PA-C   1 month ago Interstitial cystitis (chronic) with hematuria   [place] [facility] Smitty Cords, DO   1 month ago Tick bite of right forearm, initial encounter   [place] [facility] [place], [name], DO   2 months ago Insect bite of right forearm, initial encounter   [place] [facility] Mecum, Oswaldo Conroy, PA-C   2 months ago PTSD (post-traumatic stress disorder)   [place] [facility], [name], DO

## 2023-03-27 NOTE — Telephone Encounter (Signed)
prescription was reordered on 09/24/2022 by Smitty Cords, DO.  Requested Prescriptions  Signed Prescriptions Disp Refills   fluticasone (FLONASE) 50 MCG/ACT nasal spray 48 mL 1    Sig: SPRAY 2 SPRAYS INTO EACH NOSTRIL EVERY DAY     Ear, Nose, and Throat: Nasal Preparations - Corticosteroids Passed - 03/26/2023  3:02 PM      Passed - Valid encounter within last 12 months    Recent Outpatient Visits           3 weeks ago Gross hematuria   [place] [facility] M[place], Oswaldo Conroy, PA-C   1 month ago Interstitial cystitis (chronic) with hematuria   [place] [facility] [place], [name], DO   1 month ago Tick bite of right forearm, initial encounter   [place] [facility] [place], [name], DO   2 months ago Insect bite of right forearm, initial encounter   [place] [facility] Mecum, Oswaldo Conroy, PA-C   2 months ago PTSD (post-traumatic stress disorder)   [place] [facility] [place], [name], DO              Refused Prescriptions Disp Refills   valsartan (DIOVAN) 80 MG tablet [Pharmacy Med Name: VALSARTAN 80 MG TABLET] 90 tablet     Sig: TAKE 1 TABLET BY MOUTH EVERY DAY     Cardiovascular:  Angiotensin Receptor Blockers Failed - 03/26/2023  3:02 PM      Failed - K in normal range and within 180 days    Potassium  Date Value Ref Range Status  02/25/2023 3.4 (L) 3.5 - 5.1 mmol/L Final  04/18/2012 3.1 (L) 3.5 - 5.1 mmol/L Final         Passed - Cr in normal range and within 180 days    Creat  Date Value Ref Range Status  04/17/2022 0.74 0.50 - 1.03 mg/dL Final   Creatinine, Ser  Date Value Ref Range Status  02/25/2023 0.78 0.44 - 1.00 mg/dL Final         Passed - Patient is not pregnant      Passed - Last BP in normal range    BP Readings from Last 1 Encounters:  03/24/23 120/73         Passed - Valid encounter within last 6 months    Recent Outpatient  Visits           3 weeks ago Gross hematuria   [place] [facility] Mecum, Erin E, PA-C   1 month ago Interstitial cystitis (chronic) with hematuria   [place] [facility] Smitty Cords, DO   1 month ago Tick bite of right forearm, initial encounter   [place] [facility] [place], [name], DO   2 months ago Insect bite of right forearm, initial encounter   [place] [facility] Mecum, Oswaldo Conroy, PA-C   2 months ago PTSD (post-traumatic stress disorder)   [place] [facility] [place], [name], DO

## 2023-04-01 NOTE — Telephone Encounter (Signed)
Noted. No further appts scheduled.

## 2023-04-01 NOTE — Addendum Note (Signed)
Addended by: Smitty Cords on: 04/01/2023 06:08 PM   Modules accepted: Orders

## 2023-04-03 ENCOUNTER — Ambulatory Visit: Payer: Self-pay

## 2023-04-03 NOTE — Telephone Encounter (Signed)
Chief Complaint: Frequent Urination Symptoms: Increased in urination, stomach cramps, blood in urine, burning with urination Frequency: constant Pertinent Negatives: Patient denies odor, chest pain, nausea, vomiting Disposition: [] ED /[] Urgent Care (no appt availability in office) / [x] Appointment(In office/virtual)/ []  [place] [facility]/ [] Home Care/ [] Refused Recommended Disposition /[] [place] [facility]/ []  Follow-up with PCP Additional Notes: Patient states she has been experiencing urinary symptoms for 2 days and symptoms are progressively getting worse. Patient states she has increased output with burning, pain and difficulty starting stream. Patient states she does not want to go to urgent care or the urologist because they are dismissive of her concerns due to past experiences. Care advice was given and patient has been scheduled with PCP on 03/06/23 at 1600. Patient advised to seek care at Urgent care if symptoms get worse, patient verbalized understanding.   Summary: blood in urine: burning when urinating   Blood in urine : burning when urinating     Reason for Disposition  Side (flank) or lower back pain present  Answer Assessment - Initial Assessment Questions 1. SYMPTOM: "What's the main symptom you're concerned about?" (e.g., frequency, incontinence)    increase urination  and burning  2. ONSET: "When did the  burning  start?"     2 days ago  3. PAIN: "Is there any pain?" If Yes, ask: "How bad is it?" (Scale: 1-10; mild, moderate, severe)     Moderate  4. CAUSE: "What do you think is causing the symptoms?"     I think I have a UTI 5. OTHER SYMPTOMS: "Do you have any other symptoms?" (e.g., blood in urine, fever, flank pain, pain with urination)     Blood urine, pain with urination, flank pain  Protocols used: Urinary Symptoms-A-AH

## 2023-04-06 ENCOUNTER — Encounter: Payer: Self-pay | Admitting: Family Medicine

## 2023-04-06 ENCOUNTER — Ambulatory Visit: Payer: Medicaid Other | Admitting: Family Medicine

## 2023-04-06 VITALS — BP 122/84 | HR 74 | Temp 95.9°F | Wt 179.0 lb

## 2023-04-06 DIAGNOSIS — N3011 Interstitial cystitis (chronic) with hematuria: Secondary | ICD-10-CM

## 2023-04-06 DIAGNOSIS — R350 Frequency of micturition: Secondary | ICD-10-CM | POA: Diagnosis not present

## 2023-04-06 LAB — POCT URINALYSIS DIPSTICK
Bilirubin, UA: NEGATIVE
Glucose, UA: NEGATIVE
Ketones, UA: NEGATIVE
Leukocytes, UA: NEGATIVE
Nitrite, UA: NEGATIVE
Protein, UA: NEGATIVE
Spec Grav, UA: <=1.005 — AB (ref 1.010–1.025)
Urobilinogen, UA: 0.2 U/dL
pH, UA: 6.5 (ref 5.0–8.0)

## 2023-04-06 MED ORDER — FOSFOMYCIN TROMETHAMINE 3 G PO PACK
3.0000 g | PACK | Freq: Once | ORAL | 0 refills | Status: AC
Start: 2023-04-06 — End: 2023-04-06

## 2023-04-06 NOTE — Progress Notes (Unsigned)
Subjective:    Patient ID: Shirley [name], female    DOB: November 17, 1970, 52 y.o.   MRN: [id number]  Shirley [name] is a 52 y.o. female presenting on 04/06/2023 for Recurrent UTI  Patient presents for a same day appointment.  HPI  Recurrent UTI Chronic Interstitial Cystitis  Awaiting new Urologist at [facility], she will call to schedule.  UA today  Recent symptoms Incomplete emptying with frequent urination  Has Sample Gemtesa from Urology, Medicine for bladder spasm taking PRN  Also history of LEEP, prior history diagnosed cancer awaiting OBGYN follow up      02/03/2023    3:04 PM 12/04/2022   12:41 PM 10/14/2022    4:08 PM  Depression screen PHQ 2/9  Decreased Interest 1 3 3   Down, Depressed, Hopeless 1 2 3   PHQ - 2 Score 2 5 6   Altered sleeping 1 3 3   Tired, decreased energy 1 3 3   Change in appetite 1 2 3   Feeling bad or failure about yourself  1 3 3   Trouble concentrating 1 1 0  Moving slowly or fidgety/restless 0 3 3  Suicidal thoughts 1 0 0  PHQ-9 Score 8 20 21   Difficult doing work/chores Somewhat difficult Somewhat difficult Extremely dIfficult    Social History   Tobacco Use   Smoking status: Never    Passive exposure: Never   Smokeless tobacco: Never  Vaping Use   Vaping status: Never Used  Substance Use Topics   Alcohol use: Not Currently   Drug use: Never    Review of Systems Per HPI unless specifically indicated above     Objective:    BP 122/84 (BP Location: Left Arm, Patient Position: Sitting, Cuff Size: Normal)   Pulse 74   Temp (!) 95.9 F (35.5 C) (Temporal)   Wt 179 lb (81.2 kg)   LMP  (LMP Unknown) Comment: last cycle over a year ago  SpO2 96%   BMI 29.79 kg/m   Wt Readings from Last 3 Encounters:  04/06/23 179 lb (81.2 kg)  03/24/23 176 lb (79.8 kg)  03/06/23 176 lb 12.8 oz (80.2 kg)    Physical Exam Vitals and nursing note reviewed.  Constitutional:      General: She is not in acute distress.    Appearance:  Normal appearance. She is well-developed. She is not diaphoretic.     Comments: Well-appearing, comfortable, cooperative  HENT:     Head: Normocephalic and atraumatic.  Eyes:     General:        Right eye: No discharge.        Left eye: No discharge.     Conjunctiva/sclera: Conjunctivae normal.  Cardiovascular:     Rate and Rhythm: Normal rate.  Pulmonary:     Effort: Pulmonary effort is normal.  Skin:    General: Skin is warm and dry.     Findings: No erythema or rash.  Neurological:     Mental Status: She is alert and oriented to person, place, and time.  Psychiatric:        Mood and Affect: Mood normal.        Behavior: Behavior normal.        Thought Content: Thought content normal.     Comments: Well groomed, good eye contact, normal speech and thoughts      Results for orders placed or performed in visit on 04/06/23  POCT urinalysis dipstick  Result Value Ref Range   Color, UA  Clarity, UA     Glucose, UA Negative Negative   Bilirubin, UA Negative    Ketones, UA Negative    Spec Grav, UA <=1.005 (A) 1.010 - 1.025   Blood, UA Moderate    pH, UA 6.5 5.0 - 8.0   Protein, UA Negative Negative   Urobilinogen, UA 0.2 0.2 or 1.0 E.U./dL   Nitrite, UA Negative    Leukocytes, UA Negative Negative   Appearance     Odor        Assessment & Plan:   Problem List Items Addressed This Visit     Interstitial cystitis (chronic) with hematuria - Primary   Relevant Orders   Urine Culture   Other Visit Diagnoses     Urinary frequency       Relevant Orders   POCT urinalysis dipstick (Completed)   Urine Culture       Suspected UTI vs Recurrent cystitis  Urinalysis shows moderate RBC but no other sign of infection  Already referred to [facility]- pending scheduling  Rx Urine Culture pending  I will send Fosfomycin antibiotic, pick up if the Urine Culture shows an issue. Stay tuned.  Meds ordered this encounter  Medications   fosfomycin (MONUROL) 3 g PACK     Sig: Take 3 g by mouth once for 1 dose.    Dispense:  3 g    Refill:  0     Follow up plan: Return if symptoms worsen or fail to improve.   Saralyn Pilar, DO [facility] 04/06/2023, 4:13 PM

## 2023-04-06 NOTE — Patient Instructions (Addendum)
Thank you for coming to the office today.  Please follow-up with [facility] to follow-up  I will send Fosfomycin antibiotic, pick up if the Urine Culture shows an issue. Stay tuned.   Please schedule a Follow-up Appointment to: Return if symptoms worsen or fail to improve.  If you have any other questions or concerns, please feel free to call the office or send a message through MyChart. You may also schedule an earlier appointment if necessary.  Additionally, you may be receiving a survey about your experience at our office within a few days to 1 week by e-mail or mail. We value your feedback.  Saralyn Pilar, DO [facility], [place]

## 2023-04-07 ENCOUNTER — Encounter: Payer: Medicaid Other | Admitting: Family Medicine

## 2023-04-07 ENCOUNTER — Encounter: Payer: Self-pay | Admitting: Family Medicine

## 2023-04-07 LAB — URINE CULTURE
MICRO NUMBER:: 15440785
SPECIMEN QUALITY:: ADEQUATE

## 2023-04-16 ENCOUNTER — Telehealth: Payer: Medicaid Other | Admitting: Physician Assistant

## 2023-04-16 ENCOUNTER — Ambulatory Visit: Payer: Self-pay

## 2023-04-16 DIAGNOSIS — U071 COVID-19: Secondary | ICD-10-CM | POA: Diagnosis not present

## 2023-04-16 DIAGNOSIS — J4530 Mild persistent asthma, uncomplicated: Secondary | ICD-10-CM | POA: Diagnosis not present

## 2023-04-16 MED ORDER — NIRMATRELVIR/RITONAVIR (PAXLOVID)TABLET
3.0000 | ORAL_TABLET | Freq: Two times a day (BID) | ORAL | 0 refills | Status: AC
Start: 2023-04-16 — End: 2023-04-21

## 2023-04-16 MED ORDER — ALBUTEROL SULFATE HFA 108 (90 BASE) MCG/ACT IN AERS
1.0000 | INHALATION_SPRAY | RESPIRATORY_TRACT | 0 refills | Status: DC | PRN
Start: 2023-04-16 — End: 2023-07-09

## 2023-04-16 MED ORDER — BENZONATATE 100 MG PO CAPS
100.0000 mg | ORAL_CAPSULE | Freq: Three times a day (TID) | ORAL | 0 refills | Status: DC | PRN
Start: 2023-04-16 — End: 2023-07-14

## 2023-04-16 NOTE — Progress Notes (Signed)
DUplicate chart. Marked for merge. Visit completed in patients original chart.

## 2023-04-16 NOTE — Telephone Encounter (Signed)
  Chief Complaint: sore throat Symptoms: lymph node enlargement , sore throat, headache, Frequency: yesterday Pertinent Negatives: Patient denies SOB,sore throat Disposition: [] ED /[] Urgent Care (no appt availability in office) / [] Appointment(In office/virtual)/ [x]  [place] [facility]/ [] Home Care/ [] Refused Recommended Disposition /[] [place] [facility]/ []  Follow-up with PCP Additional Notes: Pt wanting abx and Paxlovid and meds to help sx Reason for Disposition  [1] COVID-19 EXPOSURE within last 14 days AND [2] weak immune system (e.g., HIV positive, cancer chemo, splenectomy, organ transplant, chronic steroids) AND [3] NO symptoms  Answer Assessment - Initial Assessment Questions 1. COVID-19 EXPOSURE: "Please describe how you were exposed to someone with a COVID-19 infection."    son 2. PLACE of CONTACT: "Where were you when you were exposed to COVID-19?" (e.g., home, school, medical waiting room; which city?)     home 3. TYPE of CONTACT: "How much contact was there?" (e.g., sitting next to, live in same house, work in same office, same building)     Live in same  4. DURATION of CONTACT: "How long were you in contact with the COVID-19 patient?" (e.g., a few seconds, passed by person, a few minutes, 15 minutes or longer, live with the patient)     Lives with pt  5. MASK: "Were you wearing a mask?" "Was the other person wearing a mask?" Note: wearing a mask reduces the risk of an otherwise close contact.     no 6. DATE of CONTACT: "When did you have contact with a COVID-19 patient?" (e.g., how many days ago)     son 7. COMMUNITY SPREAD: "Do you live in or have you traveled to an area where there are lots of COVID-19 cases (community spread)?" (See public health department website, if unsure)       yes 8. SYMPTOMS: "Do you have any symptoms?" (e.g., fever, cough, breathing difficulty, loss of taste or smell) Symptoms started Wednesday      Lymph node enlargement, chest green, 101,  yesterday , headache, back pain, loss of smell  11. HIGH RISK: "Do you have any heart or lung problems?" (e.g., asthma, COPD, heart failure) "Do you have a weak immune system or other risk factors?" (e.g., HIV positive, chemotherapy, renal failure, diabetes mellitus, sickle cell anemia, obesity)       HTN  Protocols used: Coronavirus (COVID-19) Exposure-A-AH

## 2023-04-16 NOTE — Progress Notes (Signed)
Virtual Visit Consent   Shirley [name], you are scheduled for a virtual visit with a [facility] provider today. Just as with appointments in the office, your consent must be obtained to participate. Your consent will be active for this visit and any virtual visit you may have with one of our providers in the next 365 days. If you have a MyChart account, a copy of this consent can be sent to you electronically.  As this is a virtual visit, video technology does not allow for your provider to perform a traditional examination. This may limit your provider's ability to fully assess your condition. If your provider identifies any concerns that need to be evaluated in person or the need to arrange testing (such as labs, EKG, etc.), we will make arrangements to do so. Although advances in technology are sophisticated, we cannot ensure that it will always work on either your end or our end. If the connection with a video visit is poor, the visit may have to be switched to a telephone visit. With either a video or telephone visit, we are not always able to ensure that we have a secure connection.  By engaging in this virtual visit, you consent to the provision of healthcare and authorize for your insurance to be billed (if applicable) for the services provided during this visit. Depending on your insurance coverage, you may receive a charge related to this service.  I need to obtain your verbal consent now. Are you willing to proceed with your visit today? Shirley [name] has provided verbal consent on 04/16/2023 for a virtual visit (video or telephone). Shirley [name], [place]  Date: 04/16/2023 3:11 PM  Virtual Visit via Video Note   I, Shirley [name], connected with  Shirley [name]  ([id number], [birth year]) on 04/16/23 at  3:15 PM EDT by a video-enabled telemedicine application and verified that I am speaking with the correct person using two identifiers.  Location: Patient: Virtual Visit Location  Patient: Home Provider: Virtual Visit Location Provider: Home Office   I discussed the limitations of evaluation and management by telemedicine and the availability of in person appointments. The patient expressed understanding and agreed to proceed.    History of Present Illness: Shirley [name] is a 52 y.o. who identifies as a female who was assigned female at birth, and is being seen today for COVID-19. Endorses symptoms starting yesterday with aches, headache, sore throat, slight increased heart rate. Some mild windedness with exertion. Noting some swollen lymph nodes of neck. Son with COVID so she tested and so she tested positive for COVID as well.  Pulse Ox -- 94% RA  HPI: HPI  Problems:  Patient Active Problem List   Diagnosis Date Noted   Psychophysiological insomnia 12/04/2022   Essential hypertension 01/01/2022   Generalized anxiety disorder with panic attacks 08/26/2021   Major depressive disorder, recurrent, moderate (HCC) 08/26/2021   Attention deficit hyperactivity disorder (ADHD), combined type 08/26/2021   PTSD (post-traumatic stress disorder) 08/26/2021   Mild persistent asthma without complication 08/26/2021   Dysuria 01/22/2021   Hyperlipidemia 12/31/2020   Renal stone 12/31/2020   Hidradenitis suppurativa of left axilla 12/31/2020   Interstitial cystitis (chronic) with hematuria 11/15/2020   Perimenopause 09/19/2016   Family history of ovarian cancer 09/19/2016   Family history of colon cancer 09/19/2016   Anxiety 09/19/2016   Cyst of right ovary 09/19/2016   Abnormal uterine bleeding (AUB) 09/19/2016   Status post LEEP (loop electrosurgical excision procedure) of  cervix 09/19/2016    Allergies:  Allergies  Allergen Reactions   Bactrim [Sulfamethoxazole-Trimethoprim] Anaphylaxis   Gadolinium Anaphylaxis   Gadolinium Derivatives Anaphylaxis   Iodinated Contrast Media Anaphylaxis   Iodine Anaphylaxis   Other Nausea And Vomiting, Rash and Hives    Uncoded  Allergy. Allergen: BEZONZTATE Uncoded Allergy. Allergen: decongestants   Sulfa Antibiotics Anaphylaxis and Other (See Comments)    Uncoded Allergy. Allergen: decongestants Uncoded Allergy. Allergen: decongestants    Azithromycin Hives   Clarithromycin Other (See Comments)   Furosemide Other (See Comments)   Guaifenesin Nausea And Vomiting   Macrobid [Nitrofurantoin Monohyd Macro] Swelling    Tongue swelling, chest tightness    Nitrofurantoin Hives   Penicillins Hives   Amoxicillin Rash   Doxycycline Rash and Other (See Comments)   Keflex [Cephalexin] Rash and Other (See Comments)   Levofloxacin Rash   Medications:  Current Outpatient Medications:    benzonatate (TESSALON) 100 MG capsule, Take 1 capsule (100 mg total) by mouth 3 (three) times daily as needed for cough., Disp: 30 capsule, Rfl: 0   nirmatrelvir/ritonavir (PAXLOVID) 20 x 150 MG & 10 x 100MG  TABS, Take 3 tablets by mouth 2 (two) times daily for 5 days. (Take nirmatrelvir 150 mg two tablets twice daily for 5 days and ritonavir 100 mg one tablet twice daily for 5 days) Patient GFR is >60, Disp: 30 tablet, Rfl: 0   albuterol (VENTOLIN HFA) 108 (90 Base) MCG/ACT inhaler, Inhale 1-2 puffs into the lungs every 4 (four) hours as needed for wheezing or shortness of breath., Disp: 6.7 each, Rfl: 0   ALPRAZolam (XANAX) 1 MG tablet, Take 1 mg by mouth 3 (three) times daily as needed for anxiety. 1 mg three times a day and 2 mg at bedtime, Disp: , Rfl:    amitriptyline (ELAVIL) 50 MG tablet, Take 1 tablet (50 mg total) by mouth at bedtime., Disp: 30 tablet, Rfl: 2   cetirizine (ZYRTEC) 10 MG tablet, TAKE 1 TABLET BY MOUTH EVERY DAY, Disp: 90 tablet, Rfl: 0   Cholecalciferol (VITAMIN D3) 125 MCG (5000 UT) CAPS, Take by mouth., Disp: , Rfl:    conjugated estrogens (PREMARIN) vaginal cream, Estrogen Cream Instruction Discard applicator Apply pea sized amount to tip of finger to urethra before bed. Wash hands well after application. Use  Monday, Wednesday and Friday, Disp: 42.5 g, Rfl: 12   DENTAGEL 1.1 % GEL dental gel, Place onto teeth daily., Disp: , Rfl:    erythromycin ophthalmic ointment, Place 1 Application into both eyes 4 (four) times daily. For 7-10 day, Disp: 3.5 g, Rfl: 1   fluticasone (FLONASE) 50 MCG/ACT nasal spray, SPRAY 2 SPRAYS INTO EACH NOSTRIL EVERY DAY, Disp: 48 mL, Rfl: 1   ondansetron (ZOFRAN-ODT) 4 MG disintegrating tablet, Take 1 tablet (4 mg total) by mouth every 8 (eight) hours as needed for nausea or vomiting., Disp: 20 tablet, Rfl: 0   triamcinolone cream (KENALOG) 0.5 %, Apply 1 Application topically 3 (three) times daily., Disp: 30 g, Rfl: 0   valsartan (DIOVAN) 160 MG tablet, Take 1 tablet (160 mg total) by mouth daily., Disp: 90 tablet, Rfl: 1   Vibegron (GEMTESA) 75 MG TABS, Take 1 tablet (75 mg total) by mouth daily., Disp: , Rfl:   Observations/Objective: Patient is well-developed, well-nourished in no acute distress.  Resting comfortably at home.  Head is normocephalic, atraumatic.  No labored breathing. Speech is clear and coherent with logical content.  Patient is alert and oriented at baseline.   Assessment  and Plan: 1. COVID-19 - benzonatate (TESSALON) 100 MG capsule; Take 1 capsule (100 mg total) by mouth 3 (three) times daily as needed for cough.  Dispense: 30 capsule; Refill: 0 - nirmatrelvir/ritonavir (PAXLOVID) 20 x 150 MG & 10 x 100MG  TABS; Take 3 tablets by mouth 2 (two) times daily for 5 days. (Take nirmatrelvir 150 mg two tablets twice daily for 5 days and ritonavir 100 mg one tablet twice daily for 5 days) Patient GFR is >60  Dispense: 30 tablet; Refill: 0 - albuterol (VENTOLIN HFA) 108 (90 Base) MCG/ACT inhaler; Inhale 1-2 puffs into the lungs every 4 (four) hours as needed for wheezing or shortness of breath.  Dispense: 6.7 each; Refill: 0  2. Mild persistent asthma without complication - albuterol (VENTOLIN HFA) 108 (90 Base) MCG/ACT inhaler; Inhale 1-2 puffs into the  lungs every 4 (four) hours as needed for wheezing or shortness of breath.  Dispense: 6.7 each; Refill: 0  Patient with multiple risk factors for complicated course of illness. Discussed risks/benefits of antiviral medications including most common potential ADRs. Patient voiced understanding and would like to proceed with antiviral medication. They are candidate for Paxlovid. Rx sent to pharmacy. Supportive measures, OTC medications and vitamin regimen reviewed. Tessalon and albuterol per orders. Quarantine reviewed in detail. Strict ER precautions discussed with patient.    Follow Up Instructions: I discussed the assessment and treatment plan with the patient. The patient was provided an opportunity to ask questions and all were answered. The patient agreed with the plan and demonstrated an understanding of the instructions.  A copy of instructions were sent to the patient via MyChart unless otherwise noted below.   The patient was advised to call back or seek an in-person evaluation if the symptoms worsen or if the condition fails to improve as anticipated.  Time:  I spent 10 minutes with the patient via telehealth technology discussing the above problems/concerns.    Shirley Climes, PA-C

## 2023-04-16 NOTE — Patient Instructions (Signed)
Ayesha Mohair, thank you for joining Piedad Climes, PA-C for today's virtual visit.  While this provider is not your primary care provider (PCP), if your PCP is located in our provider database this encounter information will be shared with them immediately following your visit.   A [facility] MyChart account gives you access to today's visit and all your visits, tests, and labs performed at [facility] " click here if you don't have a [facility] MyChart account or go to [email or web address]  Consent: (Patient) Shirley [name] provided verbal consent for this virtual visit at the beginning of the encounter.  Current Medications:  Current Outpatient Medications:    acyclovir (ZOVIRAX) 400 MG tablet, Take 1 tablet (400 mg total) by mouth 3 (three) times daily., Disp: 21 tablet, Rfl: 3   albuterol (VENTOLIN HFA) 108 (90 Base) MCG/ACT inhaler, TAKE 2 PUFFS BY MOUTH EVERY 6 HOURS AS NEEDED FOR WHEEZE OR SHORTNESS OF BREATH, Disp: 6.7 each, Rfl: 3   ALPRAZolam (XANAX) 1 MG tablet, Take 1 mg by mouth 3 (three) times daily as needed for anxiety. 1 mg three times a day and 2 mg at bedtime, Disp: , Rfl:    amitriptyline (ELAVIL) 50 MG tablet, Take 1 tablet (50 mg total) by mouth at bedtime., Disp: 30 tablet, Rfl: 2   cetirizine (ZYRTEC) 10 MG tablet, TAKE 1 TABLET BY MOUTH EVERY DAY, Disp: 90 tablet, Rfl: 0   Cholecalciferol (VITAMIN D3) 125 MCG (5000 UT) CAPS, Take by mouth., Disp: , Rfl:    conjugated estrogens (PREMARIN) vaginal cream, Estrogen Cream Instruction Discard applicator Apply pea sized amount to tip of finger to urethra before bed. Wash hands well after application. Use Monday, Wednesday and Friday, Disp: 42.5 g, Rfl: 12   DENTAGEL 1.1 % GEL dental gel, Place onto teeth daily., Disp: , Rfl:    erythromycin ophthalmic ointment, Place 1 Application into both eyes 4 (four) times daily. For 7-10 day, Disp: 3.5 g, Rfl: 1   fluticasone (FLONASE) 50 MCG/ACT nasal spray, SPRAY  2 SPRAYS INTO EACH NOSTRIL EVERY DAY, Disp: 48 mL, Rfl: 1   ondansetron (ZOFRAN-ODT) 4 MG disintegrating tablet, Take 1 tablet (4 mg total) by mouth every 8 (eight) hours as needed for nausea or vomiting., Disp: 20 tablet, Rfl: 0   triamcinolone cream (KENALOG) 0.5 %, Apply 1 Application topically 3 (three) times daily., Disp: 30 g, Rfl: 0   valsartan (DIOVAN) 160 MG tablet, Take 1 tablet (160 mg total) by mouth daily., Disp: 90 tablet, Rfl: 1   Vibegron (GEMTESA) 75 MG TABS, Take 1 tablet (75 mg total) by mouth daily., Disp: , Rfl:    Medications ordered in this encounter:  No orders of the defined types were placed in this encounter.    *If you need refills on other medications prior to your next appointment, please contact your pharmacy*  Follow-Up: Call back or seek an in-person evaluation if the symptoms worsen or if the condition fails to improve as anticipated.   Virtual Care [phone]  Care Instructions: Please keep well-hydrated and get plenty of rest. Start a saline nasal rinse to flush out your nasal passages. You can use plain Mucinex to help thin congestion. If you have a humidifier, running in the bedroom at night. I want you to start OTC vitamin D3 1000 units daily, vitamin C 1000 mg daily, and a zinc supplement. Please take prescribed medications as directed.      Isolation Instructions: You are to isolate  at home until you have been fever free for at least 24 hours without a fever-reducing medication, and symptoms have been steadily improving for 24 hours. At that time,  you can end isolation but need to mask for an additional 5 days.   If you must be around other household members who do not have symptoms, you need to make sure that both you and the family members are masking consistently with a high-quality mask.  If you note any worsening of symptoms despite treatment, please seek an in-person evaluation ASAP. If you note any significant shortness  of breath or any chest pain, please seek ER evaluation. Please do not delay care!   COVID-19: What to Do if You Are Sick If you test positive and are an older adult or someone who is at high risk of getting very sick from COVID-19, treatment may be available. Contact a healthcare provider right away after a positive test to determine if you are eligible, even if your symptoms are mild right now. You can also visit a Test to Treat location and, if eligible, receive a prescription from a provider. Don't delay: Treatment must be started within the first few days to be effective. If you have a fever, cough, or other symptoms, you might have COVID-19. Most people have mild illness and are able to recover at home. If you are sick: Keep track of your symptoms. If you have an emergency warning sign (including trouble breathing), call 911. Steps to help prevent the spread of COVID-19 if you are sick If you are sick with COVID-19 or think you might have COVID-19, follow the steps below to care for yourself and to help protect other people in your home and community. Stay home except to get medical care Stay home. Most people with COVID-19 have mild illness and can recover at home without medical care. Do not leave your home, except to get medical care. Do not visit public areas and do not go to places where you are unable to wear a mask. Take care of yourself. Get rest and stay hydrated. Take over-the-counter medicines, such as acetaminophen, to help you feel better. Stay in touch with your doctor. Call before you get medical care. Be sure to get care if you have trouble breathing, or have any other emergency warning signs, or if you think it is an emergency. Avoid public transportation, ride-sharing, or taxis if possible. Get tested If you have symptoms of COVID-19, get tested. While waiting for test results, stay away from others, including staying apart from those living in your household. Get tested as  soon as possible after your symptoms start. Treatments may be available for people with COVID-19 who are at risk for becoming very sick. Don't delay: Treatment must be started early to be effective--some treatments must begin within 5 days of your first symptoms. Contact your healthcare provider right away if your test result is positive to determine if you are eligible. Self-tests are one of several options for testing for the virus that causes COVID-19 and may be more convenient than laboratory-based tests and point-of-care tests. Ask your healthcare provider or your local health department if you need help interpreting your test results. You can visit your state, tribal, local, and territorial health department's website to look for the latest local information on testing sites. Separate yourself from other people As much as possible, stay in a specific room and away from other people and pets in your home. If possible, you should use  a separate bathroom. If you need to be around other people or animals in or outside of the home, wear a well-fitting mask. Tell your close contacts that they may have been exposed to COVID-19. An infected person can spread COVID-19 starting 48 hours (or 2 days) before the person has any symptoms or tests positive. By letting your close contacts know they may have been exposed to COVID-19, you are helping to protect everyone. See COVID-19 and Animals if you have questions about pets. If you are diagnosed with COVID-19, someone from the health department may call you. Answer the call to slow the spread. Monitor your symptoms Symptoms of COVID-19 include fever, cough, or other symptoms. Follow care instructions from your healthcare provider and local health department. Your local health authorities may give instructions on checking your symptoms and reporting information. When to seek emergency medical attention Look for emergency warning signs* for COVID-19. If someone is  showing any of these signs, seek emergency medical care immediately: Trouble breathing Persistent pain or pressure in the chest New confusion Inability to wake or stay awake Pale, gray, or blue-colored skin, lips, or nail beds, depending on skin tone *This list is not all possible symptoms. Please call your medical provider for any other symptoms that are severe or concerning to you. Call 911 or call ahead to your local emergency facility: Notify the operator that you are seeking care for someone who has or may have COVID-19. Call ahead before visiting your doctor Call ahead. Many medical visits for routine care are being postponed or done by phone or telemedicine. If you have a medical appointment that cannot be postponed, call your doctor's office, and tell them you have or may have COVID-19. This will help the office protect themselves and other patients. If you are sick, wear a well-fitting mask You should wear a mask if you must be around other people or animals, including pets (even at home). Wear a mask with the best fit, protection, and comfort for you. You don't need to wear the mask if you are alone. If you can't put on a mask (because of trouble breathing, for example), cover your coughs and sneezes in some other way. Try to stay at least 6 feet away from other people. This will help protect the people around you. Masks should not be placed on young children under age 60 years, anyone who has trouble breathing, or anyone who is not able to remove the mask without help. Cover your coughs and sneezes Cover your mouth and nose with a tissue when you cough or sneeze. Throw away used tissues in a lined trash can. Immediately wash your hands with soap and water for at least 20 seconds. If soap and water are not available, clean your hands with an alcohol-based hand sanitizer that contains at least 60% alcohol. Clean your hands often Wash your hands often with soap and water for at least 20  seconds. This is especially important after blowing your nose, coughing, or sneezing; going to the bathroom; and before eating or preparing food. Use hand sanitizer if soap and water are not available. Use an alcohol-based hand sanitizer with at least 60% alcohol, covering all surfaces of your hands and rubbing them together until they feel dry. Soap and water are the best option, especially if hands are visibly dirty. Avoid touching your eyes, nose, and mouth with unwashed hands. Handwashing Tips Avoid sharing personal household items Do not share dishes, drinking glasses, cups, eating utensils, towels, or  bedding with other people in your home. Wash these items thoroughly after using them with soap and water or put in the dishwasher. Clean surfaces in your home regularly Clean and disinfect high-touch surfaces (for example, doorknobs, tables, handles, light switches, and countertops) in your "sick room" and bathroom. In shared spaces, you should clean and disinfect surfaces and items after each use by the person who is ill. If you are sick and cannot clean, a caregiver or other person should only clean and disinfect the area around you (such as your bedroom and bathroom) on an as needed basis. Your caregiver/other person should wait as long as possible (at least several hours) and wear a mask before entering, cleaning, and disinfecting shared spaces that you use. Clean and disinfect areas that may have blood, stool, or body fluids on them. Use household cleaners and disinfectants. Clean visible dirty surfaces with household cleaners containing soap or detergent. Then, use a household disinfectant. Use a product from [employer] List N: Disinfectants for Coronavirus (COVID-19). Be sure to follow the instructions on the label to ensure safe and effective use of the product. Many products recommend keeping the surface wet with a disinfectant for a certain period of time (look at "contact time" on the product  label). You may also need to wear personal protective equipment, such as gloves, depending on the directions on the product label. Immediately after disinfecting, wash your hands with soap and water for 20 seconds. For completed guidance on cleaning and disinfecting your home, visit Complete Disinfection Guidance. Take steps to improve ventilation at home Improve ventilation (air flow) at home to help prevent from spreading COVID-19 to other people in your household. Clear out COVID-19 virus particles in the air by opening windows, using air filters, and turning on fans in your home. Use this interactive tool to learn how to improve air flow in your home. When you can be around others after being sick with COVID-19 Deciding when you can be around others is different for different situations. Find out when you can safely end home isolation. For any additional questions about your care, contact your healthcare provider or state or local health department. 10/16/2020 Content source: [facility] ([facility]), Division of Viral Diseases This information is not intended to replace advice given to you by your health care provider. Make sure you discuss any questions you have with your health care provider. Document Revised: 11/29/2020 Document Reviewed: 11/29/2020 Elsevier Patient Education  2022 [employer].  If you have been instructed to have an in-person evaluation today at a local Urgent Care facility, please use the link below. It will take you to a list of all of our available [facility], including address, phone number and hours of operation. Please do not delay care.  [facility]  If you or a family member do not have a primary care provider, use the link below to schedule a visit and establish care. When you choose a [facility] primary care physician or advanced practice provider, you gain a long-term partner in  health. Find a Primary Care Provider  Learn more about [facility]'s in-office and virtual care options:  - Get Care Now

## 2023-04-20 ENCOUNTER — Telehealth: Payer: Self-pay | Admitting: Family Medicine

## 2023-04-20 ENCOUNTER — Encounter: Payer: Self-pay | Admitting: Family Medicine

## 2023-04-20 ENCOUNTER — Ambulatory Visit: Payer: Self-pay | Admitting: *Deleted

## 2023-04-20 NOTE — Telephone Encounter (Signed)
This mychart message reply is from a different topic. She has contacted our office already and this issue has been resolved. See phone note.  Saralyn Pilar, DO [facility] 04/20/2023, 7:14 PM

## 2023-04-20 NOTE — Telephone Encounter (Signed)
Unfortunately there is not much else I can do at this time. I reviewed her symptoms and responses to the triage. This is classic for COVID and post covid symptoms, she can have sinus or congestion symptoms that present. We do not use antibiotics immediately within course of COVID. I would wait 7-10 days regardless before ordering other treatments. Most of these symptoms do resolve with supportive care.  She already did a Virtual visit on 04/16/23. I believe the rule is 7 days until repeat virtual visit for billing purposes. So we cannot re-schedule a sooner virtual visit. We could plan for virtual follow-up at end of week if needed. She was given several rx treatments already.    Saralyn Pilar, DO [facility] 04/20/2023, 12:48 PM

## 2023-04-20 NOTE — Telephone Encounter (Signed)
Pt is waiting for a call back from the office.  Please call the pt and see NT notes.

## 2023-04-20 NOTE — Telephone Encounter (Signed)
  Chief Complaint: Covid positive Symptoms: Cough, congestion,sinus drainage, greenish,facial pain, swelling, eyes with pressure,headache Frequency: Covid Positive 5 days ago. Symptoms onset Tuesday 04/14/23 Pertinent Negatives: Patient denies  Disposition: [] ED /[] Urgent Care (no appt availability in office) / [] Appointment(In office/virtual)/ []  [place] [place]/ [] Home Care/ [x] Refused Recommended Disposition /[] [facility]/ []  Follow-up with PCP Additional Notes: Pt requesting VV, no availability. Advised UC, declines. Requesting VV, adamant about speaking to PCP. Advised pt NT would route to practice for PCPs review and final disposition. Care advise provided, pt verbalizes understanding.  Reason for Disposition  [1] HIGH RISK patient AND [2] influenza is widespread in the community AND [3] ONE OR MORE respiratory symptoms: cough, sore throat, runny or stuffy nose  MILD difficulty breathing (e.g., minimal/no SOB at rest, SOB with walking, pulse <100)    Advised UC  Answer Assessment - Initial Assessment Questions 1. COVID-19 DIAGNOSIS: "How do you know that you have COVID?" (e.g., positive lab test or self-test, diagnosed by doctor or NP/PA, symptoms after exposure).     Home test 2. COVID-19 EXPOSURE: "Was there any known exposure to COVID before the symptoms began?" CDC Definition of close contact: within 6 feet (2 meters) for a total of 15 minutes or more over a 24-hour period.      5 days ago 3. ONSET: "When did the COVID-19 symptoms start?"      Tuesday 9/16th 4. WORST SYMPTOM: "What is your worst symptom?" (e.g., cough, fever, shortness of breath, muscle aches)     cough 5. COUGH: "Do you have a cough?" If Yes, ask: "How bad is the cough?"       yes 6. FEVER: "Do you have a fever?" If Yes, ask: "What is your temperature, how was it measured, and when did it start?"     No 7. RESPIRATORY STATUS: "Describe your breathing?" (e.g., normal; shortness of breath,  wheezing, unable to speak)      SOB with coughing 8. BETTER-SAME-WORSE: "Are you getting better, staying the same or getting worse compared to yesterday?"  If getting worse, ask, "In what way?"     Worse 9. OTHER SYMPTOMS: "Do you have any other symptoms?"  (e.g., chills, fatigue, headache, loss of smell or taste, muscle pain, sore throat)     Sinus pressure and pain, frontal headache,eyes, cheekbones painful, neck nodes swollen.Sinus drainage white cloudy, now greenish 10. HIGH RISK DISEASE: "Do you have any chronic medical problems?" (e.g., asthma, heart or lung disease, weak immune system, obesity, etc.)        11. VACCINE: "Have you had the COVID-19 vaccine?" If Yes, ask: "Which one, how many shots, when did you get it?"       No  13. O2 SATURATION MONITOR:  "Do you use an oxygen saturation monitor (pulse oximeter) at home?" If Yes, ask "What is your reading (oxygen level) today?" "What is your usual oxygen saturation reading?" (e.g., 95%)       Lowest 89-95%  At 95% now.  Protocols used: Coronavirus (COVID-19) Diagnosed or Suspected-A-AH

## 2023-04-21 NOTE — Telephone Encounter (Signed)
Copied from CRM [id number]. Topic: General - Other >> Apr 21, 2023  1:04 PM Ja-Kwan M wrote: Reason for CRM: Pt stated she will being coming over to the office to speak with the office manager. Pt stated she will put on a mask but she needs to come in to talk with the office manager.

## 2023-04-21 NOTE — Telephone Encounter (Signed)
Called and spoke with patient and she reported that she is upset because sh/e has a fever and can not get an antibiotic.  Patient reported that she is going to keep her virtual appointment on 9/26 at 11:20, however, she will be going back to [facility] for her primary care health.  Please note that patient has self discharged herself from [facility] at of 04/23/23.

## 2023-04-21 NOTE — Telephone Encounter (Signed)
See phone message.  Saralyn Pilar, DO [facility] 04/21/2023, 5:47 PM

## 2023-04-21 NOTE — Telephone Encounter (Signed)
Pt calling back regarding symptoms and feeling so bad. States that she didn't take the Paxlovid and she tried OTC Mucinex but caused HR to increase to 120s. Pt is having green mucus and nasal congestion and head pressure d/t sinuses. Pt feels she has a sinus infection. Relayed message from Dr. Kirtland Bouchard on 04/20/23. Pt disagreed with not being seen. Feels she needs an abx d/t not being able to take OTC meds to help with sx. Let her know that Barbara Cower, [facility] Agent scheduled her for 04/23/23 at 1120 but pt said her niece and son said she needs to change drs since Dr. Kirtland Bouchard wouldn't prescribe abx at this time. I advised pt multiple times since Dr Kirtland Bouchard nurse was at lunch I would send a message back so they can follow up with her but pt decided she was going to call insurance and see about switching drs because she felt like we weren't helping her. Pt disconnected call.

## 2023-04-23 ENCOUNTER — Encounter: Payer: Self-pay | Admitting: Family Medicine

## 2023-04-23 ENCOUNTER — Telehealth: Payer: Medicaid Other | Admitting: Family Medicine

## 2023-04-23 DIAGNOSIS — U071 COVID-19: Secondary | ICD-10-CM | POA: Diagnosis not present

## 2023-04-23 DIAGNOSIS — J0110 Acute frontal sinusitis, unspecified: Secondary | ICD-10-CM

## 2023-04-23 MED ORDER — CLINDAMYCIN HCL 300 MG PO CAPS
300.0000 mg | ORAL_CAPSULE | Freq: Three times a day (TID) | ORAL | 0 refills | Status: DC
Start: 2023-04-23 — End: 2023-07-14

## 2023-04-23 NOTE — Patient Instructions (Addendum)
Please schedule a Follow-up Appointment to: Return if symptoms worsen or fail to improve.  If you have any other questions or concerns, please feel free to call the office or send a message through MyChart. You may also schedule an earlier appointment if necessary.  Additionally, you may be receiving a survey about your experience at our office within a few days to 1 week by e-mail or mail. We value your feedback.  Saralyn Pilar, DO [facility] [place]

## 2023-04-23 NOTE — Progress Notes (Signed)
Subjective:    Patient ID: Shirley [name], female    DOB: May 13, 1971, 52 y.o.   MRN: [id number]  Shirley [name] is a 52 y.o. female presenting on 04/23/2023 for Covid Positive  Virtual / Telehealth Encounter - Video Visit via MyChart The purpose of this virtual visit is to provide medical care while limiting exposure to the novel coronavirus (COVID19) for both patient and office staff.  Consent was obtained for remote visit:  Yes.   Answered questions that patient had about telehealth interaction:  Yes.   I discussed the limitations, risks, security and privacy concerns of performing an evaluation and management service by video/telephone. I also discussed with the patient that there may be a patient responsible charge related to this service. The patient expressed understanding and agreed to proceed.  Patient Location: Home Provider Location: [facility] (Office)  Participants in virtual visit: - Patient: LASONDA UMBARGER - CMA: Shirley Muscat CMA - Provider: Dr Althea Charon   HPI  History of Present Illness    Sinusitis COVID-19 recently with positive home test  presents with persistent symptoms a week post-diagnosis. She reports a lingering cough and intermittent fevers. She also describes sinus-related symptoms, including drainage, which she believes may have progressed into a sinus infection. She has tried over-the-counter remedies such as Mucinex, Delsym, with limited relief. She has   Sick contact w/ son w COVID 2 weeks prior.  She did a virtual E visit on 04/16/23 for COVID and was treated with Paxlovid and Tessalon Perls, and Albuterol.  She has multiple antibiotic allergies. Limited options.  She is able to maintain her breathing without significant dyspnea.     Additionally  The patient also mentions a recent change in the development of what she believes to be a lymph node or lipoma on left arm has been persistent or chronic now some inc in  size.       02/03/2023    3:04 PM 12/04/2022   12:41 PM 10/14/2022    4:08 PM  Depression screen PHQ 2/9  Decreased Interest 1 3 3   Down, Depressed, Hopeless 1 2 3   PHQ - 2 Score 2 5 6   Altered sleeping 1 3 3   Tired, decreased energy 1 3 3   Change in appetite 1 2 3   Feeling bad or failure about yourself  1 3 3   Trouble concentrating 1 1 0  Moving slowly or fidgety/restless 0 3 3  Suicidal thoughts 1 0 0  PHQ-9 Score 8 20 21   Difficult doing work/chores Somewhat difficult Somewhat difficult Extremely dIfficult    Social History   Tobacco Use   Smoking status: Never    Passive exposure: Never   Smokeless tobacco: Never  Vaping Use   Vaping status: Never Used  Substance Use Topics   Alcohol use: Not Currently   Drug use: Never    Review of Systems Per HPI unless specifically indicated above     Objective:    LMP  (LMP Unknown) Comment: last cycle over a year ago  Wt Readings from Last 3 Encounters:  04/06/23 179 lb (81.2 kg)  03/24/23 176 lb (79.8 kg)  03/06/23 176 lb 12.8 oz (80.2 kg)    Physical Exam  Note examination was completely remotely via video observation objective data only  Gen - well-appearing, no acute distress or apparent pain, comfortable HEENT - eyes appear clear without discharge or redness Heart/Lungs - cannot examine virtually - observed no evidence of coughing or labored breathing.  Abd - cannot examine virtually  Skin - face visible today- no rash Neuro - awake, alert, oriented Psych - mild anxious, but not panic.   Results for orders placed or performed in visit on 04/06/23  Urine Culture   Specimen: Urine  Result Value Ref Range   MICRO NUMBER: [id number]    SPECIMEN QUALITY: Adequate    Sample Source URINE, CLEAN CATCH    STATUS: FINAL    Result:      Mixed genital flora isolated. These superficial bacteria are not indicative of a urinary tract infection. No further organism identification is warranted on this specimen. If  clinically indicated, recollect clean-catch, mid-stream urine and transfer  immediately to Urine Culture Transport Tube.   POCT urinalysis dipstick  Result Value Ref Range   Color, UA     Clarity, UA     Glucose, UA Negative Negative   Bilirubin, UA Negative    Ketones, UA Negative    Spec Grav, UA <=1.005 (A) 1.010 - 1.025   Blood, UA Moderate    pH, UA 6.5 5.0 - 8.0   Protein, UA Negative Negative   Urobilinogen, UA 0.2 0.2 or 1.0 E.U./dL   Nitrite, UA Negative    Leukocytes, UA Negative Negative   Appearance     Odor        Assessment & Plan:   Problem List Items Addressed This Visit   None Visit Diagnoses     Acute non-recurrent frontal sinusitis    -  Primary   Relevant Medications   clindamycin (CLEOCIN) 300 MG capsule   COVID-19 virus infection       Relevant Medications   clindamycin (CLEOCIN) 300 MG capsule       Assessment and Plan    COVID-19 Persistent symptoms including cough and fever one week after diagnosis. Discussed the possibility of sinus infection secondary to COVID-19.  We reviewed indications for antibiotics following covid and emphasized that initially several days ago was too early based on clinical criteria given she in early acute phases of covid or viral infection.  -Order Clindamycin for possible sinus infection.  If still not improving, and develop worsening lower respiratory symptoms, I have suggested Chest X-ray imaging within 2 weeks of infection.  We discussed the plan to transition care to [facility] for new PCP going forward.   Meds ordered this encounter  Medications   clindamycin (CLEOCIN) 300 MG capsule    Sig: Take 1 capsule (300 mg total) by mouth 3 (three) times daily. X 7 days    Dispense:  30 capsule    Refill:  0     Follow up plan: Return if symptoms worsen or fail to improve.   Patient verbalizes understanding with the above medical recommendations including the limitation of remote medical advice.  Specific  follow-up and call-back criteria were given for patient to follow-up or seek medical care more urgently if needed.  Total duration of direct patient care provided via video conference: 15 minutes    Saralyn Pilar, DO [facility] 04/23/2023, 12:04 PM

## 2023-04-29 ENCOUNTER — Ambulatory Visit: Payer: Medicaid Other | Admitting: Cardiology

## 2023-05-07 ENCOUNTER — Ambulatory Visit: Payer: Medicaid Other | Admitting: Cardiology

## 2023-05-07 ENCOUNTER — Encounter: Payer: Self-pay | Admitting: Cardiology

## 2023-05-07 VITALS — BP 120/79 | HR 74 | Ht 65.0 in | Wt 182.0 lb

## 2023-05-07 DIAGNOSIS — I10 Essential (primary) hypertension: Secondary | ICD-10-CM

## 2023-05-07 DIAGNOSIS — Z1211 Encounter for screening for malignant neoplasm of colon: Secondary | ICD-10-CM

## 2023-05-07 DIAGNOSIS — Z131 Encounter for screening for diabetes mellitus: Secondary | ICD-10-CM | POA: Diagnosis not present

## 2023-05-07 DIAGNOSIS — F411 Generalized anxiety disorder: Secondary | ICD-10-CM | POA: Diagnosis not present

## 2023-05-07 DIAGNOSIS — Z1231 Encounter for screening mammogram for malignant neoplasm of breast: Secondary | ICD-10-CM

## 2023-05-07 DIAGNOSIS — E785 Hyperlipidemia, unspecified: Secondary | ICD-10-CM | POA: Diagnosis not present

## 2023-05-07 DIAGNOSIS — F331 Major depressive disorder, recurrent, moderate: Secondary | ICD-10-CM

## 2023-05-07 DIAGNOSIS — N3010 Interstitial cystitis (chronic) without hematuria: Secondary | ICD-10-CM

## 2023-05-07 DIAGNOSIS — Z1329 Encounter for screening for other suspected endocrine disorder: Secondary | ICD-10-CM | POA: Diagnosis not present

## 2023-05-07 DIAGNOSIS — I341 Nonrheumatic mitral (valve) prolapse: Secondary | ICD-10-CM

## 2023-05-07 DIAGNOSIS — F410 Panic disorder [episodic paroxysmal anxiety] without agoraphobia: Secondary | ICD-10-CM

## 2023-05-07 MED ORDER — AMITRIPTYLINE HCL 50 MG PO TABS: 50.0000 mg | ORAL_TABLET | Freq: Every day | ORAL | 2 refills | Status: DC
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
## 2023-05-07 NOTE — Addendum Note (Signed)
Addended by: Marisue Ivan on: 05/07/2023 02:14 PM   Modules accepted: Orders

## 2023-05-08 ENCOUNTER — Telehealth: Payer: Self-pay

## 2023-05-08 ENCOUNTER — Other Ambulatory Visit: Payer: Self-pay | Admitting: Cardiology

## 2023-05-08 DIAGNOSIS — Z7729 Contact with and (suspected ) exposure to other hazardous substances: Secondary | ICD-10-CM

## 2023-05-08 NOTE — Telephone Encounter (Signed)
Pt called and stated that she has had a gas leak in her house for over a year now and she just found out about it today- she is afraid that she has carbon monoxide poisoning and she wants to know if there is any kind of test that can be done or blood work orders that can be put in for her?    I told the pt I would send you a message and said she may need an apt, she said she would be calling back on Monday to find out what you thnk she should do

## 2023-05-14 ENCOUNTER — Institutional Professional Consult (permissible substitution): Payer: Medicaid Other | Admitting: Cardiovascular Disease

## 2023-05-18 ENCOUNTER — Other Ambulatory Visit: Payer: Self-pay

## 2023-05-18 DIAGNOSIS — W57XXXA Bitten or stung by nonvenomous insect and other nonvenomous arthropods, initial encounter: Secondary | ICD-10-CM

## 2023-05-18 MED ORDER — TRIAMCINOLONE ACETONIDE 0.5 % EX CREA
1.0000 | TOPICAL_CREAM | Freq: Three times a day (TID) | CUTANEOUS | 0 refills | Status: DC
Start: 2023-05-18 — End: 2023-11-23

## 2023-06-04 ENCOUNTER — Institutional Professional Consult (permissible substitution): Payer: Medicaid Other | Admitting: Cardiovascular Disease

## 2023-06-09 ENCOUNTER — Other Ambulatory Visit: Payer: Self-pay | Admitting: Cardiology

## 2023-06-09 ENCOUNTER — Ambulatory Visit: Payer: Medicaid Other | Admitting: Cardiovascular Disease

## 2023-06-09 ENCOUNTER — Ambulatory Visit (INDEPENDENT_AMBULATORY_CARE_PROVIDER_SITE_OTHER): Payer: Medicaid Other

## 2023-06-09 ENCOUNTER — Encounter: Payer: Self-pay | Admitting: Cardiovascular Disease

## 2023-06-09 VITALS — BP 122/80 | HR 91 | Ht 65.0 in | Wt 172.4 lb

## 2023-06-09 DIAGNOSIS — I10 Essential (primary) hypertension: Secondary | ICD-10-CM

## 2023-06-09 DIAGNOSIS — R9431 Abnormal electrocardiogram [ECG] [EKG]: Secondary | ICD-10-CM

## 2023-06-09 DIAGNOSIS — R0789 Other chest pain: Secondary | ICD-10-CM

## 2023-06-09 DIAGNOSIS — I341 Nonrheumatic mitral (valve) prolapse: Secondary | ICD-10-CM | POA: Diagnosis not present

## 2023-06-09 DIAGNOSIS — R0602 Shortness of breath: Secondary | ICD-10-CM

## 2023-06-09 DIAGNOSIS — E785 Hyperlipidemia, unspecified: Secondary | ICD-10-CM

## 2023-06-09 DIAGNOSIS — R300 Dysuria: Secondary | ICD-10-CM | POA: Diagnosis not present

## 2023-06-09 DIAGNOSIS — N3010 Interstitial cystitis (chronic) without hematuria: Secondary | ICD-10-CM

## 2023-06-09 LAB — POCT URINALYSIS DIPSTICK
Bilirubin, UA: NEGATIVE
Blood, UA: POSITIVE
Glucose, UA: NEGATIVE
Ketones, UA: NEGATIVE
Leukocytes, UA: NEGATIVE
Nitrite, UA: NEGATIVE
Protein, UA: NEGATIVE
Spec Grav, UA: <=1.005 — AB (ref 1.010–1.025)
Urobilinogen, UA: 0.2 U/dL
pH, UA: 6 (ref 5.0–8.0)

## 2023-06-09 MED ORDER — VALSARTAN 80 MG PO TABS: 80.0000 mg | ORAL_TABLET | Freq: Every day | ORAL | 11 refills | Status: DC
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
## 2023-06-09 NOTE — Progress Notes (Signed)
Patient notified

## 2023-06-10 DIAGNOSIS — R300 Dysuria: Secondary | ICD-10-CM | POA: Diagnosis not present

## 2023-06-11 ENCOUNTER — Telehealth: Payer: Self-pay

## 2023-06-11 ENCOUNTER — Telehealth: Payer: Self-pay | Admitting: Cardiology

## 2023-06-11 NOTE — Telephone Encounter (Signed)
Pt called and stated that she is still having extreme discomfort while urinating and is prone to UTIs, her UA came back normal but was sent off for a culture, she is asking for something to be called in-Please advise

## 2023-06-11 NOTE — Telephone Encounter (Signed)
Patient called in c/o dysuria, stating it is worse than it was on Tuesday. She is also stating that she is running a fever. I advised her we are still waiting on the urine culture results. Patient stated she has been having the dysuria for a week and a half and she cannot wait until tomorrow for something to be called in so she guesses she'll have to go to the hospital.

## 2023-06-12 ENCOUNTER — Other Ambulatory Visit: Payer: Self-pay | Admitting: Cardiology

## 2023-06-12 ENCOUNTER — Other Ambulatory Visit: Payer: Self-pay | Admitting: Family Medicine

## 2023-06-12 ENCOUNTER — Encounter: Payer: Self-pay | Admitting: Cardiology

## 2023-06-12 DIAGNOSIS — N3001 Acute cystitis with hematuria: Secondary | ICD-10-CM

## 2023-06-12 LAB — URINE CULTURE

## 2023-06-12 MED ORDER — FOSFOMYCIN TROMETHAMINE 3 G PO PACK: 3.0000 g | PACK | Freq: Once | ORAL | 0 refills | Status: AC

## 2023-06-12 NOTE — Telephone Encounter (Signed)
Patient called in again to check and see if her culture results are back. Advised her that they are not. She states she now has blood in her urine and feels "run down". She said she doesn't want to have to go to the ER on a Friday night for a UTI. What to do?

## 2023-06-15 ENCOUNTER — Encounter: Payer: Self-pay | Admitting: Cardiovascular Disease

## 2023-06-23 ENCOUNTER — Ambulatory Visit: Payer: Medicaid Other

## 2023-06-23 DIAGNOSIS — I361 Nonrheumatic tricuspid (valve) insufficiency: Secondary | ICD-10-CM

## 2023-06-23 DIAGNOSIS — I341 Nonrheumatic mitral (valve) prolapse: Secondary | ICD-10-CM

## 2023-06-23 DIAGNOSIS — R9431 Abnormal electrocardiogram [ECG] [EKG]: Secondary | ICD-10-CM

## 2023-06-23 DIAGNOSIS — E785 Hyperlipidemia, unspecified: Secondary | ICD-10-CM

## 2023-06-23 DIAGNOSIS — I340 Nonrheumatic mitral (valve) insufficiency: Secondary | ICD-10-CM

## 2023-06-23 DIAGNOSIS — R0602 Shortness of breath: Secondary | ICD-10-CM

## 2023-06-23 DIAGNOSIS — R0789 Other chest pain: Secondary | ICD-10-CM

## 2023-06-23 DIAGNOSIS — I10 Essential (primary) hypertension: Secondary | ICD-10-CM

## 2023-06-30 ENCOUNTER — Ambulatory Visit: Payer: Medicaid Other | Admitting: Cardiovascular Disease

## 2023-06-30 ENCOUNTER — Encounter: Payer: Self-pay | Admitting: Cardiovascular Disease

## 2023-06-30 VITALS — BP 131/82 | HR 82 | Ht 65.0 in | Wt 172.3 lb

## 2023-06-30 DIAGNOSIS — E785 Hyperlipidemia, unspecified: Secondary | ICD-10-CM

## 2023-06-30 DIAGNOSIS — I10 Essential (primary) hypertension: Secondary | ICD-10-CM | POA: Diagnosis not present

## 2023-06-30 DIAGNOSIS — I340 Nonrheumatic mitral (valve) insufficiency: Secondary | ICD-10-CM

## 2023-06-30 MED ORDER — VALSARTAN 80 MG PO TABS
80.0000 mg | ORAL_TABLET | Freq: Every day | ORAL | 11 refills | Status: DC
Start: 2023-06-30 — End: 2024-04-22

## 2023-06-30 NOTE — Progress Notes (Signed)
Cardiology Office Note   Date:  06/30/2023   ID:  Shirley [name], DOB Nov 10, 1970, MRN [id number]  PCP:  Marisue Ivan, NP  Cardiologist:  Adrian Blackwater, MD      History of Present Illness: Shirley [name] is a 52 y.o. female who presents for No chief complaint on file.   No further chest pain, occasional dizziness      Past Medical History:  Diagnosis Date   Abnormal Pap smear of cervix    ADHD (attention deficit hyperactivity disorder)    Anxiety    Frequent headaches    Hypertension    Mitral valve prolapse    Ovarian cyst    Spina bifida occulta    UTI (urinary tract infection)      Past Surgical History:  Procedure Laterality Date   LEEP       Current Outpatient Medications  Medication Sig Dispense Refill   albuterol (VENTOLIN HFA) 108 (90 Base) MCG/ACT inhaler Inhale 1-2 puffs into the lungs every 4 (four) hours as needed for wheezing or shortness of breath. 6.7 each 0   ALPRAZolam (XANAX) 1 MG tablet Take 1 mg by mouth 3 (three) times daily as needed for anxiety. 1 mg three times a day and 2 mg at bedtime     amitriptyline (ELAVIL) 50 MG tablet TAKE 1 TABLET BY MOUTH EVERYDAY AT BEDTIME 90 tablet 1   benzonatate (TESSALON) 100 MG capsule Take 1 capsule (100 mg total) by mouth 3 (three) times daily as needed for cough. 30 capsule 0   cetirizine (ZYRTEC) 10 MG tablet TAKE 1 TABLET BY MOUTH EVERY DAY 90 tablet 0   Cholecalciferol (VITAMIN D3) 125 MCG (5000 UT) CAPS Take by mouth.     clindamycin (CLEOCIN) 300 MG capsule Take 1 capsule (300 mg total) by mouth 3 (three) times daily. X 7 days 30 capsule 0   conjugated estrogens (PREMARIN) vaginal cream Estrogen Cream Instruction Discard applicator Apply pea sized amount to tip of finger to urethra before bed. Wash hands well after application. Use Monday, Wednesday and Friday 42.5 g 12   DENTAGEL 1.1 % GEL dental gel Place onto teeth daily.     erythromycin ophthalmic ointment Place 1 Application into both eyes 4  (four) times daily. For 7-10 day 3.5 g 1   fluticasone (FLONASE) 50 MCG/ACT nasal spray SPRAY 2 SPRAYS INTO EACH NOSTRIL EVERY DAY 48 mL 1   ondansetron (ZOFRAN-ODT) 4 MG disintegrating tablet Take 1 tablet (4 mg total) by mouth every 8 (eight) hours as needed for nausea or vomiting. 20 tablet 0   triamcinolone cream (KENALOG) 0.5 % Apply 1 Application topically 3 (three) times daily. 30 g 0   valsartan (DIOVAN) 80 MG tablet Take 1 tablet (80 mg total) by mouth daily. 30 tablet 11   Vibegron (GEMTESA) 75 MG TABS Take 1 tablet (75 mg total) by mouth daily.     No current facility-administered medications for this visit.    Allergies:   Bactrim [sulfamethoxazole-trimethoprim], Gadolinium, Gadolinium derivatives, Iodinated contrast media, Iodine, Other, Sulfa antibiotics, Azithromycin, Clarithromycin, Furosemide, Guaifenesin, Macrobid [nitrofurantoin monohyd macro], Nitrofurantoin, Penicillins, Amoxicillin, Doxycycline, Keflex [cephalexin], and Levofloxacin    Social History:   reports that she has never smoked. She has never been exposed to tobacco smoke. She has never used smokeless tobacco. She reports that she does not currently use alcohol. She reports that she does not use drugs.   Family History:  family history includes Anxiety disorder in her sister; Cancer  in her father; Coronary artery disease in her father; Diabetes Mellitus II in her father and mother; Drug abuse in her daughter; Heart attack in her sister; Heart disease in her father; Mental illness in her father; Mitral valve prolapse in her sister; Ovarian cancer in her mother; Pancreatic cancer in her mother; Stroke in her father.    ROS:     Review of Systems  Constitutional: Negative.   HENT: Negative.    Eyes: Negative.   Respiratory: Negative.    Gastrointestinal: Negative.   Genitourinary: Negative.   Musculoskeletal: Negative.   Skin: Negative.   Neurological: Negative.   Endo/Heme/Allergies: Negative.    Psychiatric/Behavioral: Negative.    All other systems reviewed and are negative.     All other systems are reviewed and negative.    PHYSICAL EXAM: VS:  BP 131/82   Pulse 82   Ht 5\' 5"  (1.651 m)   Wt 172 lb 4.8 oz (78.2 kg)   LMP  (LMP Unknown) Comment: last cycle over a year ago  SpO2 99%   BMI 28.67 kg/m  , BMI Body mass index is 28.67 kg/m. Last weight:  Wt Readings from Last 3 Encounters:  06/30/23 172 lb 4.8 oz (78.2 kg)  06/09/23 172 lb 6.4 oz (78.2 kg)  05/07/23 182 lb (82.6 kg)     Physical Exam Constitutional:      Appearance: Normal appearance.  Cardiovascular:     Rate and Rhythm: Normal rate and regular rhythm.     Heart sounds: Normal heart sounds.  Pulmonary:     Effort: Pulmonary effort is normal.     Breath sounds: Normal breath sounds.  Musculoskeletal:     Right lower leg: No edema.     Left lower leg: No edema.  Neurological:     Mental Status: She is alert.       EKG:   Recent Labs: 01/24/2023: TSH 1.909 02/25/2023: ALT 15; BUN 13; Creatinine, Ser 0.78; Hemoglobin 12.6; Platelets 257; Potassium 3.4; Sodium 141    Lipid Panel    Component Value Date/Time   CHOL 171 04/17/2022 1341   TRIG 145 04/17/2022 1341   HDL 56 04/17/2022 1341   CHOLHDL 3.1 04/17/2022 1341   LDLCALC 90 04/17/2022 1341      Other studies Reviewed: Additional studies/ records that were reviewed today include:  Review of the above records demonstrates:       No data to display            ASSESSMENT AND PLAN:    ICD-10-CM   1. Essential hypertension  I10 valsartan (DIOVAN) 80 MG tablet    2. Hyperlipidemia, unspecified hyperlipidemia type  E78.5 valsartan (DIOVAN) 80 MG tablet   Have it checked by again and treated if high, no need to have stress test if no chest pain    3. Nonrheumatic mitral valve regurgitation  I34.0 valsartan (DIOVAN) 80 MG tablet   Trace with traceTR, normal LVEF. Mild LVH, is due to prior h/o HTN and is not indication of  CHF       Problem List Items Addressed This Visit       Cardiovascular and Mediastinum   Essential hypertension - Primary   Relevant Medications   valsartan (DIOVAN) 80 MG tablet     Other   Hyperlipidemia   Relevant Medications   valsartan (DIOVAN) 80 MG tablet   Other Visit Diagnoses     Nonrheumatic mitral valve regurgitation       Trace with traceTR,  normal LVEF. Mild LVH, is due to prior h/o HTN and is not indication of CHF   Relevant Medications   valsartan (DIOVAN) 80 MG tablet          Disposition:   Return for f/u with Amber.    Total time spent: 30 minutes  Signed,  Adrian Blackwater, MD  06/30/2023 11:44 AM    [facility]

## 2023-07-03 ENCOUNTER — Ambulatory Visit: Payer: Medicaid Other | Admitting: Cardiovascular Disease

## 2023-07-06 ENCOUNTER — Ambulatory Visit: Payer: Medicaid Other | Admitting: Cardiology

## 2023-07-09 ENCOUNTER — Ambulatory Visit: Payer: Medicaid Other | Admitting: Cardiology

## 2023-07-09 ENCOUNTER — Encounter: Payer: Self-pay | Admitting: Cardiology

## 2023-07-09 VITALS — BP 110/70 | HR 40 | Ht 65.0 in | Wt 174.0 lb

## 2023-07-09 DIAGNOSIS — U071 COVID-19: Secondary | ICD-10-CM

## 2023-07-09 DIAGNOSIS — I10 Essential (primary) hypertension: Secondary | ICD-10-CM

## 2023-07-09 DIAGNOSIS — E785 Hyperlipidemia, unspecified: Secondary | ICD-10-CM

## 2023-07-09 DIAGNOSIS — J4530 Mild persistent asthma, uncomplicated: Secondary | ICD-10-CM

## 2023-07-09 MED ORDER — ALBUTEROL SULFATE HFA 108 (90 BASE) MCG/ACT IN AERS: 1.0000 | INHALATION_SPRAY | RESPIRATORY_TRACT | 5 refills | Status: DC | PRN

## 2023-07-09 NOTE — Patient Instructions (Addendum)
[place] [facility] at [facility] [address], [address] [facility] [place],  [place]  [address]  Main: [phone]  Call about Cologuard  Return fasting lab work

## 2023-07-09 NOTE — Progress Notes (Signed)
Established Patient Office Visit  Subjective:  Patient ID: Shirley [name], female    DOB: 19-Nov-1970  Age: 52 y.o. MRN: [id number]  Chief Complaint  Patient presents with   Follow-up    2 month follow up    Patient in office for 2 month follow up. Patient did not have fasting blood work done. Has not had mammogram done. Cologuard done, patient waiting on results from company. Continues to see psychiatry.     No other concerns at this time.   Past Medical History:  Diagnosis Date   Abnormal Pap smear of cervix    ADHD (attention deficit hyperactivity disorder)    Anxiety    Frequent headaches    Hypertension    Mitral valve prolapse    Ovarian cyst    Spina bifida occulta    UTI (urinary tract infection)     Past Surgical History:  Procedure Laterality Date   LEEP      Social History   Socioeconomic History   Marital status: Single    Spouse name: Not on file   Number of children: 2   Years of education: Not on file   Highest education level: Not on file  Occupational History   Not on file  Tobacco Use   Smoking status: Never    Passive exposure: Never   Smokeless tobacco: Never  Vaping Use   Vaping status: Never Used  Substance and Sexual Activity   Alcohol use: Not Currently   Drug use: Never   Sexual activity: Yes    Partners: Male    Birth control/protection: None  Other Topics Concern   Not on file  Social History Narrative   Not on file   Social Drivers of Health   Financial Resource Strain: Medium Risk (03/27/2021)   Received from [facility]   Overall Financial Resource Strain (CARDIA)    Difficulty of Paying Living Expenses: Somewhat hard  Food Insecurity: Not on file  Transportation Needs: Unmet Transportation Needs (03/27/2021)   Received from [facility], [facility]   PRAPARE - Transportation    Lack of Transportation (Medical): Yes    Lack of Transportation (Non-Medical): Yes  Physical Activity: Not on  file  Stress: Not on file  Social Connections: Not on file  Intimate Partner Violence: Not on file    Family History  Problem Relation Age of Onset   Ovarian cancer Mother    Pancreatic cancer Mother    Diabetes Mellitus II Mother    Stroke Father    Heart disease Father    Cancer Father    Diabetes Mellitus II Father    Coronary artery disease Father    Mental illness Father    Anxiety disorder Sister    Mitral valve prolapse Sister    Heart attack Sister    Drug abuse Daughter     Allergies  Allergen Reactions   Bactrim [Sulfamethoxazole-Trimethoprim] Anaphylaxis   Gadolinium Anaphylaxis   Gadolinium Derivatives Anaphylaxis   Iodinated Contrast Media Anaphylaxis   Iodine Anaphylaxis   Other Nausea And Vomiting, Rash and Hives    Uncoded Allergy. Allergen: BEZONZTATE Uncoded Allergy. Allergen: decongestants   Sulfa Antibiotics Anaphylaxis and Other (See Comments)    Uncoded Allergy. Allergen: decongestants Uncoded Allergy. Allergen: decongestants    Azithromycin Hives   Clarithromycin Other (See Comments)   Furosemide Other (See Comments)   Guaifenesin Nausea And Vomiting   Macrobid [Nitrofurantoin Monohyd Macro] Swelling    Tongue  swelling, chest tightness    Nitrofurantoin Hives   Penicillins Hives   Amoxicillin Rash   Doxycycline Rash and Other (See Comments)   Keflex [Cephalexin] Rash and Other (See Comments)   Levofloxacin Rash    Outpatient Medications Prior to Visit  Medication Sig   ALPRAZolam (XANAX) 1 MG tablet Take 1 mg by mouth 3 (three) times daily as needed for anxiety. 1 mg three times a day and 2 mg at bedtime   amitriptyline (ELAVIL) 50 MG tablet TAKE 1 TABLET BY MOUTH EVERYDAY AT BEDTIME   benzonatate (TESSALON) 100 MG capsule Take 1 capsule (100 mg total) by mouth 3 (three) times daily as needed for cough.   cetirizine (ZYRTEC) 10 MG tablet TAKE 1 TABLET BY MOUTH EVERY DAY   Cholecalciferol (VITAMIN D3) 125 MCG (5000 UT) CAPS Take by mouth.    clindamycin (CLEOCIN) 300 MG capsule Take 1 capsule (300 mg total) by mouth 3 (three) times daily. X 7 days   conjugated estrogens (PREMARIN) vaginal cream Estrogen Cream Instruction Discard applicator Apply pea sized amount to tip of finger to urethra before bed. Wash hands well after application. Use Monday, Wednesday and Friday   DENTAGEL 1.1 % GEL dental gel Place onto teeth daily.   erythromycin ophthalmic ointment Place 1 Application into both eyes 4 (four) times daily. For 7-10 day   fluticasone (FLONASE) 50 MCG/ACT nasal spray SPRAY 2 SPRAYS INTO EACH NOSTRIL EVERY DAY   ondansetron (ZOFRAN-ODT) 4 MG disintegrating tablet Take 1 tablet (4 mg total) by mouth every 8 (eight) hours as needed for nausea or vomiting.   triamcinolone cream (KENALOG) 0.5 % Apply 1 Application topically 3 (three) times daily.   valsartan (DIOVAN) 80 MG tablet Take 1 tablet (80 mg total) by mouth daily.   Vibegron (GEMTESA) 75 MG TABS Take 1 tablet (75 mg total) by mouth daily.   [DISCONTINUED] albuterol (VENTOLIN HFA) 108 (90 Base) MCG/ACT inhaler Inhale 1-2 puffs into the lungs every 4 (four) hours as needed for wheezing or shortness of breath.   No facility-administered medications prior to visit.    Review of Systems  Constitutional: Negative.   HENT: Negative.    Eyes: Negative.   Respiratory: Negative.  Negative for shortness of breath.   Cardiovascular: Negative.  Negative for chest pain.  Gastrointestinal: Negative.  Negative for abdominal pain, constipation and diarrhea.  Genitourinary: Negative.   Musculoskeletal:  Negative for joint pain and myalgias.  Skin: Negative.   Neurological: Negative.  Negative for dizziness and headaches.  Endo/Heme/Allergies: Negative.   All other systems reviewed and are negative.      Objective:   BP 110/70 (BP Location: Left Arm, Patient Position: Sitting, Cuff Size: Large)   Pulse (!) 40   Ht 5\' 5"  (1.651 m)   Wt 174 lb (78.9 kg)   LMP  (LMP Unknown)  Comment: last cycle over a year ago  SpO2 95%   BMI 28.96 kg/m   Vitals:   07/09/23 1353 07/09/23 1412  BP: (!) 148/70 110/70  Pulse: (!) 40   Height: 5\' 5"  (1.651 m)   Weight: 174 lb (78.9 kg)   SpO2: 95%   BMI (Calculated): 28.96     Physical Exam Vitals and nursing note reviewed.  Constitutional:      Appearance: Normal appearance. She is normal weight.  HENT:     Head: Normocephalic and atraumatic.     Nose: Nose normal.     Mouth/Throat:     Mouth: Mucous membranes are  moist.  Eyes:     Extraocular Movements: Extraocular movements intact.     Conjunctiva/sclera: Conjunctivae normal.     Pupils: Pupils are equal, round, and reactive to light.  Cardiovascular:     Rate and Rhythm: Normal rate and regular rhythm.     Pulses: Normal pulses.     Heart sounds: Normal heart sounds.  Pulmonary:     Effort: Pulmonary effort is normal.     Breath sounds: Normal breath sounds.  Abdominal:     General: Abdomen is flat. Bowel sounds are normal.     Palpations: Abdomen is soft.  Musculoskeletal:        General: Normal range of motion.     Cervical back: Normal range of motion.  Skin:    General: Skin is warm and dry.  Neurological:     General: No focal deficit present.     Mental Status: She is alert and oriented to person, place, and time.  Psychiatric:        Mood and Affect: Mood normal.        Behavior: Behavior normal.        Thought Content: Thought content normal.        Judgment: Judgment normal.      No results found for any visits on 07/09/23.  Recent Results (from the past 2160 hours)  POCT Urinalysis Dipstick (78295)     Status: Abnormal   Collection Time: 06/09/23  2:18 PM  Result Value Ref Range   Color, UA yellow    Clarity, UA clear    Glucose, UA Negative Negative   Bilirubin, UA neg    Ketones, UA neg    Spec Grav, UA <=1.005 (A) 1.010 - 1.025   Blood, UA pos    pH, UA 6.0 5.0 - 8.0   Protein, UA Negative Negative   Urobilinogen, UA 0.2  0.2 or 1.0 E.U./dL   Nitrite, UA neg    Leukocytes, UA Negative Negative   Appearance clear    Odor yes   Culture, Urine     Status: None   Collection Time: 06/10/23  9:20 AM   Specimen: Urine   UC  Result Value Ref Range   Urine Culture, Routine Final report    Organism ID, Bacteria Comment     Comment: Mixed urogenital flora 25,000-50,000 colony forming units per mL       Assessment & Plan:  Schedule mammogram. Return for fasting lab work.  Continue to see psychiatry.   Problem List Items Addressed This Visit       Cardiovascular and Mediastinum   Essential hypertension - Primary     Respiratory   Mild persistent asthma without complication   Relevant Medications   albuterol (VENTOLIN HFA) 108 (90 Base) MCG/ACT inhaler     Other   Hyperlipidemia   Other Visit Diagnoses       COVID-19       Relevant Medications   albuterol (VENTOLIN HFA) 108 (90 Base) MCG/ACT inhaler       Return in about 4 months (around 11/07/2023).   Total time spent: 25 minutes  [employer], NP  07/09/2023   This document may have been prepared by Dragon Voice Recognition software and as such may include unintentional dictation errors.

## 2023-07-10 ENCOUNTER — Telehealth: Payer: Self-pay

## 2023-07-10 NOTE — Telephone Encounter (Signed)
Pt called stating that her pulse was 40 yesterday in office and has noticed it getting lower when she is sleeping, please advise on what you would like pt to do-HQ

## 2023-07-14 ENCOUNTER — Encounter: Payer: Self-pay | Admitting: Cardiology

## 2023-07-14 ENCOUNTER — Ambulatory Visit: Payer: Medicaid Other | Admitting: Cardiology

## 2023-07-14 DIAGNOSIS — J0110 Acute frontal sinusitis, unspecified: Secondary | ICD-10-CM

## 2023-07-14 DIAGNOSIS — R051 Acute cough: Secondary | ICD-10-CM

## 2023-07-14 DIAGNOSIS — U071 COVID-19: Secondary | ICD-10-CM

## 2023-07-14 MED ORDER — BENZONATATE 100 MG PO CAPS: 100.0000 mg | ORAL_CAPSULE | Freq: Three times a day (TID) | ORAL | 0 refills | Status: DC | PRN

## 2023-07-14 MED ORDER — CLINDAMYCIN HCL 300 MG PO CAPS: 300.0000 mg | ORAL_CAPSULE | Freq: Three times a day (TID) | ORAL | 0 refills | Status: DC

## 2023-07-14 NOTE — Progress Notes (Signed)
Established Patient Office Visit  Subjective:  Patient ID: Shirley [name], female    DOB: 1970/12/24  Age: 52 y.o. MRN: [id number]  Chief Complaint  Patient presents with   Acute Visit    Patient in office for an acute visit. Patient complaining of cough and possible sinus infection. Symptoms started on 07/09/2023. Patient states she is coughing up brown sputum, PND, rhinorrhea. Tried taking coricidin HBP, states it raised her HR. Will send in clindamycin. Recommend Mucinex, drink plenty of fluids.   Sinus Problem This is a new problem. The current episode started in the past 7 days. The problem has been gradually worsening since onset. There has been no fever. Associated symptoms include congestion, coughing, ear pain, sinus pressure and a sore throat. Pertinent negatives include no headaches or shortness of breath. (Teeth pain) Past treatments include nothing. The treatment provided no relief.    No other concerns at this time.   Past Medical History:  Diagnosis Date   Abnormal Pap smear of cervix    ADHD (attention deficit hyperactivity disorder)    Anxiety    Frequent headaches    Hypertension    Mitral valve prolapse    Ovarian cyst    Spina bifida occulta    UTI (urinary tract infection)     Past Surgical History:  Procedure Laterality Date   LEEP      Social History   Socioeconomic History   Marital status: Single    Spouse name: Not on file   Number of children: 2   Years of education: Not on file   Highest education level: Not on file  Occupational History   Not on file  Tobacco Use   Smoking status: Never    Passive exposure: Never   Smokeless tobacco: Never  Vaping Use   Vaping status: Never Used  Substance and Sexual Activity   Alcohol use: Not Currently   Drug use: Never   Sexual activity: Yes    Partners: Male    Birth control/protection: None  Other Topics Concern   Not on file  Social History Narrative   Not on file   Social Drivers of  Health   Financial Resource Strain: Medium Risk (03/27/2021)   Received from [facility], [facility]   Overall Financial Resource Strain (CARDIA)    Difficulty of Paying Living Expenses: Somewhat hard  Food Insecurity: Not on file  Transportation Needs: Unmet Transportation Needs (03/27/2021)   Received from [facility], [facility]   PRAPARE - Transportation    Lack of Transportation (Medical): Yes    Lack of Transportation (Non-Medical): Yes  Physical Activity: Not on file  Stress: Not on file  Social Connections: Not on file  Intimate Partner Violence: Not on file    Family History  Problem Relation Age of Onset   Ovarian cancer Mother    Pancreatic cancer Mother    Diabetes Mellitus II Mother    Stroke Father    Heart disease Father    Cancer Father    Diabetes Mellitus II Father    Coronary artery disease Father    Mental illness Father    Anxiety disorder Sister    Mitral valve prolapse Sister    Heart attack Sister    Drug abuse Daughter     Allergies  Allergen Reactions   Bactrim [Sulfamethoxazole-Trimethoprim] Anaphylaxis   Gadolinium Anaphylaxis   Gadolinium Derivatives Anaphylaxis   Iodinated Contrast Media Anaphylaxis   Iodine Anaphylaxis  Other Nausea And Vomiting, Rash and Hives    Uncoded Allergy. Allergen: BEZONZTATE Uncoded Allergy. Allergen: decongestants   Sulfa Antibiotics Anaphylaxis and Other (See Comments)    Uncoded Allergy. Allergen: decongestants Uncoded Allergy. Allergen: decongestants    Azithromycin Hives   Clarithromycin Other (See Comments)   Furosemide Other (See Comments)   Guaifenesin Nausea And Vomiting   Macrobid [Nitrofurantoin Monohyd Macro] Swelling    Tongue swelling, chest tightness    Nitrofurantoin Hives   Penicillins Hives   Amoxicillin Rash   Doxycycline Rash and Other (See Comments)   Keflex [Cephalexin] Rash and Other (See Comments)   Levofloxacin Rash    Outpatient Medications Prior to Visit   Medication Sig   albuterol (VENTOLIN HFA) 108 (90 Base) MCG/ACT inhaler Inhale 1-2 puffs into the lungs every 4 (four) hours as needed for wheezing or shortness of breath.   ALPRAZolam (XANAX) 1 MG tablet Take 1 mg by mouth 3 (three) times daily as needed for anxiety. 1 mg three times a day and 2 mg at bedtime   amitriptyline (ELAVIL) 50 MG tablet TAKE 1 TABLET BY MOUTH EVERYDAY AT BEDTIME   cetirizine (ZYRTEC) 10 MG tablet TAKE 1 TABLET BY MOUTH EVERY DAY   Cholecalciferol (VITAMIN D3) 125 MCG (5000 UT) CAPS Take by mouth.   conjugated estrogens (PREMARIN) vaginal cream Estrogen Cream Instruction Discard applicator Apply pea sized amount to tip of finger to urethra before bed. Wash hands well after application. Use Monday, Wednesday and Friday   DENTAGEL 1.1 % GEL dental gel Place onto teeth daily.   erythromycin ophthalmic ointment Place 1 Application into both eyes 4 (four) times daily. For 7-10 day   fluticasone (FLONASE) 50 MCG/ACT nasal spray SPRAY 2 SPRAYS INTO EACH NOSTRIL EVERY DAY   ondansetron (ZOFRAN-ODT) 4 MG disintegrating tablet Take 1 tablet (4 mg total) by mouth every 8 (eight) hours as needed for nausea or vomiting.   triamcinolone cream (KENALOG) 0.5 % Apply 1 Application topically 3 (three) times daily.   valsartan (DIOVAN) 80 MG tablet Take 1 tablet (80 mg total) by mouth daily.   Vibegron (GEMTESA) 75 MG TABS Take 1 tablet (75 mg total) by mouth daily.   [DISCONTINUED] benzonatate (TESSALON) 100 MG capsule Take 1 capsule (100 mg total) by mouth 3 (three) times daily as needed for cough. (Patient not taking: Reported on 07/14/2023)   [DISCONTINUED] clindamycin (CLEOCIN) 300 MG capsule Take 1 capsule (300 mg total) by mouth 3 (three) times daily. X 7 days (Patient not taking: Reported on 07/14/2023)   No facility-administered medications prior to visit.    Review of Systems  Constitutional: Negative.   HENT:  Positive for congestion, ear pain, sinus pressure, sinus pain  and sore throat.   Eyes: Negative.   Respiratory:  Positive for cough and sputum production. Negative for shortness of breath.   Cardiovascular: Negative.  Negative for chest pain.  Gastrointestinal:  Positive for nausea. Negative for abdominal pain, constipation and diarrhea.  Genitourinary: Negative.   Musculoskeletal:  Negative for joint pain and myalgias.  Skin: Negative.   Neurological: Negative.  Negative for dizziness and headaches.  Endo/Heme/Allergies: Negative.   All other systems reviewed and are negative.      Objective:   BP 132/82   Pulse 79   Ht 5\' 5"  (1.651 m)   Wt 177 lb (80.3 kg)   LMP  (LMP Unknown) Comment: last cycle over a year ago  SpO2 97%   BMI 29.45 kg/m   Vitals:  07/14/23 1352  BP: 132/82  Pulse: 79  Height: 5\' 5"  (1.651 m)  Weight: 177 lb (80.3 kg)  SpO2: 97%  BMI (Calculated): 29.45    Physical Exam Vitals and nursing note reviewed.  Constitutional:      Appearance: Normal appearance. She is normal weight.  HENT:     Head: Normocephalic and atraumatic.     Nose: Nose normal.     Mouth/Throat:     Mouth: Mucous membranes are moist.  Eyes:     Extraocular Movements: Extraocular movements intact.     Conjunctiva/sclera: Conjunctivae normal.     Pupils: Pupils are equal, round, and reactive to light.  Cardiovascular:     Rate and Rhythm: Normal rate and regular rhythm.     Pulses: Normal pulses.     Heart sounds: Normal heart sounds.  Pulmonary:     Effort: Pulmonary effort is normal.     Breath sounds: Normal breath sounds.  Abdominal:     General: Abdomen is flat. Bowel sounds are normal.     Palpations: Abdomen is soft.  Musculoskeletal:        General: Normal range of motion.     Cervical back: Normal range of motion.  Skin:    General: Skin is warm and dry.  Neurological:     General: No focal deficit present.     Mental Status: She is alert and oriented to person, place, and time.  Psychiatric:        Mood and  Affect: Mood normal.        Behavior: Behavior normal.        Thought Content: Thought content normal.        Judgment: Judgment normal.      No results found for any visits on 07/14/23.  Recent Results (from the past 2160 hours)  POCT Urinalysis Dipstick (02542)     Status: Abnormal   Collection Time: 06/09/23  2:18 PM  Result Value Ref Range   Color, UA yellow    Clarity, UA clear    Glucose, UA Negative Negative   Bilirubin, UA neg    Ketones, UA neg    Spec Grav, UA <=1.005 (A) 1.010 - 1.025   Blood, UA pos    pH, UA 6.0 5.0 - 8.0   Protein, UA Negative Negative   Urobilinogen, UA 0.2 0.2 or 1.0 E.U./dL   Nitrite, UA neg    Leukocytes, UA Negative Negative   Appearance clear    Odor yes   Culture, Urine     Status: None   Collection Time: 06/10/23  9:20 AM   Specimen: Urine   UC  Result Value Ref Range   Urine Culture, Routine Final report    Organism ID, Bacteria Comment     Comment: Mixed urogenital flora 25,000-50,000 colony forming units per mL       Assessment & Plan:  Clindamycin sent to the pharmacy. Push fluids. Mucinex.  Problem List Items Addressed This Visit   None Visit Diagnoses       Acute non-recurrent frontal sinusitis       Relevant Medications   clindamycin (CLEOCIN) 300 MG capsule   benzonatate (TESSALON) 100 MG capsule     COVID-19       Relevant Medications   clindamycin (CLEOCIN) 300 MG capsule   benzonatate (TESSALON) 100 MG capsule       Return if symptoms worsen or fail to improve.   Total time spent: 25 minutes  Google,  NP  07/14/2023   This document may have been prepared by [facility] Voice Recognition software and as such may include unintentional dictation errors.

## 2023-07-30 ENCOUNTER — Encounter: Payer: Self-pay | Admitting: Cardiology

## 2023-07-30 ENCOUNTER — Other Ambulatory Visit: Payer: Self-pay

## 2023-07-30 DIAGNOSIS — H1032 Unspecified acute conjunctivitis, left eye: Secondary | ICD-10-CM

## 2023-07-31 ENCOUNTER — Other Ambulatory Visit: Payer: Self-pay

## 2023-07-31 MED ORDER — ERYTHROMYCIN 5 MG/GM OP OINT: TOPICAL_OINTMENT | OPHTHALMIC | 1 refills | Status: DC

## 2023-08-17 ENCOUNTER — Telehealth: Payer: Self-pay

## 2023-08-17 ENCOUNTER — Other Ambulatory Visit: Payer: Self-pay | Admitting: Cardiology

## 2023-08-17 DIAGNOSIS — J0110 Acute frontal sinusitis, unspecified: Secondary | ICD-10-CM

## 2023-08-17 MED ORDER — CLINDAMYCIN HCL 300 MG PO CAPS: 300.0000 mg | ORAL_CAPSULE | Freq: Three times a day (TID) | ORAL | 0 refills | Status: AC

## 2023-08-17 NOTE — Telephone Encounter (Signed)
Informed via Mychart message

## 2023-08-17 NOTE — Telephone Encounter (Signed)
Patient called stating that she's got Congestion, Sore Throat, R Ear Pain and said its draining. She states her son is sick and she's been taking care of him and she's worn down and not able to get better, do you want her to come see you or call her something in

## 2023-08-26 ENCOUNTER — Other Ambulatory Visit: Payer: Self-pay | Admitting: Cardiology

## 2023-08-26 ENCOUNTER — Telehealth: Payer: Self-pay | Admitting: Cardiology

## 2023-08-26 DIAGNOSIS — K047 Periapical abscess without sinus: Secondary | ICD-10-CM

## 2023-08-26 NOTE — Telephone Encounter (Signed)
Patient left VM that she has a tooth infection and it is hurting her, her face is swollen. Wants to know if she needs to be seen or if we can send in an antibiotic. Please advise.

## 2023-08-27 ENCOUNTER — Encounter: Payer: Self-pay | Admitting: Cardiology

## 2023-08-27 NOTE — Telephone Encounter (Signed)
Latoya from [facility] needs to know if okay for patient to have extraction today with no epi.

## 2023-09-01 ENCOUNTER — Telehealth: Payer: Self-pay

## 2023-09-01 NOTE — Telephone Encounter (Signed)
 Pt LM stating she got a tooth pulled and now she's running a fever, wants us  to call something in  ** patient informed that she will have to go to her dentist to get it checked out for infection or dry sockett or go to Urgent Care or ED if she can not get what she needs from the dentist that pulled her tooth, patient understood and confirmed she would be going back to the dental office that pulled her tooth first

## 2023-09-02 ENCOUNTER — Telehealth: Payer: Self-pay | Admitting: Cardiology

## 2023-09-02 DIAGNOSIS — J0110 Acute frontal sinusitis, unspecified: Secondary | ICD-10-CM

## 2023-09-02 NOTE — Telephone Encounter (Signed)
 Patient called in c/o infection in her mouth where she had a tooth extracted on Monday. The dentist that pulled the tooth is not wanting to send her an antibiotic in. I advised her that if the dentist will not send her something to call us  and we will send her something in.

## 2023-09-23 DIAGNOSIS — F410 Panic disorder [episodic paroxysmal anxiety] without agoraphobia: Secondary | ICD-10-CM | POA: Diagnosis not present

## 2023-09-23 DIAGNOSIS — F900 Attention-deficit hyperactivity disorder, predominantly inattentive type: Secondary | ICD-10-CM | POA: Diagnosis not present

## 2023-09-24 ENCOUNTER — Ambulatory Visit: Payer: Medicaid Other | Admitting: Cardiology

## 2023-09-24 ENCOUNTER — Encounter: Payer: Self-pay | Admitting: Cardiology

## 2023-09-24 ENCOUNTER — Other Ambulatory Visit: Payer: Self-pay | Admitting: Cardiology

## 2023-09-24 VITALS — BP 132/80 | HR 85 | Ht 65.0 in | Wt 172.0 lb

## 2023-09-24 DIAGNOSIS — R8290 Unspecified abnormal findings in urine: Secondary | ICD-10-CM

## 2023-09-24 DIAGNOSIS — J019 Acute sinusitis, unspecified: Secondary | ICD-10-CM | POA: Diagnosis not present

## 2023-09-24 LAB — POCT URINALYSIS DIPSTICK
Bilirubin, UA: NEGATIVE
Glucose, UA: NEGATIVE
Ketones, UA: NEGATIVE
Leukocytes, UA: NEGATIVE
Nitrite, UA: NEGATIVE
Protein, UA: NEGATIVE
Spec Grav, UA: 1.025 (ref 1.010–1.025)
Urobilinogen, UA: 0.2 U/dL
pH, UA: 6 (ref 5.0–8.0)

## 2023-09-24 MED ORDER — CLINDAMYCIN HCL 300 MG PO CAPS
300.0000 mg | ORAL_CAPSULE | Freq: Two times a day (BID) | ORAL | 0 refills | Status: AC
Start: 2023-09-24 — End: 2023-10-01

## 2023-09-24 MED ORDER — FLUTICASONE-SALMETEROL 45-21 MCG/ACT IN AERO: 2.0000 | INHALATION_SPRAY | Freq: Two times a day (BID) | RESPIRATORY_TRACT | 12 refills | Status: DC

## 2023-09-24 NOTE — Progress Notes (Signed)
 Established Patient Office Visit  Subjective:  Patient ID: Shirley [name], female    DOB: 1971-05-29  Age: 53 y.o. MRN: [id number]  Chief Complaint  Patient presents with   Acute Visit    UA/Sinus and requesting to get gabapentin filled     Patient in office for an acute visit. Patient has multiple complaints today. Patient complaining of burning with urination. UA shows blood, will send for culture. Sent in clindamycin.  Has vague complaints of sinus problems. Patient complains of a runny nose. Has a history of asthma, requesting a steroidal inhaler, will send in Advair.  Discussed importance of getting lab work done, mammogram.   Urinary Tract Infection  This is a new problem. The current episode started in the past 7 days. The problem occurs every urination. The problem has been unchanged. The quality of the pain is described as burning. The pain is moderate. There has been no fever. Associated symptoms include flank pain and hematuria. She has tried nothing for the symptoms. The treatment provided no relief. Her past medical history is significant for recurrent UTIs.  Sinus Problem This is a new problem. The current episode started in the past 7 days. The problem is unchanged. There has been no fever. The pain is moderate. Pertinent negatives include no congestion, coughing, headaches, shortness of breath or sinus pressure. Past treatments include nothing.    No other concerns at this time.   Past Medical History:  Diagnosis Date   Abnormal Pap smear of cervix    ADHD (attention deficit hyperactivity disorder)    Anxiety    Frequent headaches    Hypertension    Mitral valve prolapse    Ovarian cyst    Spina bifida occulta    UTI (urinary tract infection)     Past Surgical History:  Procedure Laterality Date   LEEP      Social History   Socioeconomic History   Marital status: Single    Spouse name: Not on file   Number of children: 2   Years of education: Not on  file   Highest education level: Not on file  Occupational History   Not on file  Tobacco Use   Smoking status: Never    Passive exposure: Never   Smokeless tobacco: Never  Vaping Use   Vaping status: Never Used  Substance and Sexual Activity   Alcohol use: Not Currently   Drug use: Never   Sexual activity: Yes    Partners: Male    Birth control/protection: None  Other Topics Concern   Not on file  Social History Narrative   Not on file   Social Drivers of Health   Financial Resource Strain: Medium Risk (03/27/2021)   Received from [facility], [facility]   Overall Financial Resource Strain (CARDIA)    Difficulty of Paying Living Expenses: Somewhat hard  Food Insecurity: Not on file  Transportation Needs: Unmet Transportation Needs (03/27/2021)   Received from [facility], [facility]   [facility] - Transportation    Lack of Transportation (Medical): Yes    Lack of Transportation (Non-Medical): Yes  Physical Activity: Not on file  Stress: Not on file  Social Connections: Not on file  Intimate Partner Violence: Not on file    Family History  Problem Relation Age of Onset   Ovarian cancer Mother    Pancreatic cancer Mother    Diabetes Mellitus II Mother    Stroke Father    Heart disease  Father    Cancer Father    Diabetes Mellitus II Father    Coronary artery disease Father    Mental illness Father    Anxiety disorder Sister    Mitral valve prolapse Sister    Heart attack Sister    Drug abuse Daughter     Allergies  Allergen Reactions   Bactrim [Sulfamethoxazole-Trimethoprim] Anaphylaxis   Gadolinium Anaphylaxis   Gadolinium Derivatives Anaphylaxis   Iodinated Contrast Media Anaphylaxis   Iodine Anaphylaxis   Other Nausea And Vomiting, Rash and Hives    Uncoded Allergy. Allergen: BEZONZTATE Uncoded Allergy. Allergen: decongestants   Sulfa Antibiotics Anaphylaxis and Other (See Comments)    Uncoded Allergy. Allergen: decongestants Uncoded  Allergy. Allergen: decongestants    Azithromycin Hives   Clarithromycin Other (See Comments)   Furosemide Other (See Comments)   Guaifenesin Nausea And Vomiting   Macrobid [Nitrofurantoin Monohyd Macro] Swelling    Tongue swelling, chest tightness    Nitrofurantoin Hives   Penicillins Hives   Amoxicillin Rash   Doxycycline Rash and Other (See Comments)   Keflex [Cephalexin] Rash and Other (See Comments)   Levofloxacin Rash    Outpatient Medications Prior to Visit  Medication Sig   amphetamine-dextroamphetamine (ADDERALL XR) 20 MG 24 hr capsule Take 20 mg by mouth every morning.   albuterol (VENTOLIN HFA) 108 (90 Base) MCG/ACT inhaler Inhale 1-2 puffs into the lungs every 4 (four) hours as needed for wheezing or shortness of breath.   ALPRAZolam (XANAX) 1 MG tablet Take 1 mg by mouth 3 (three) times daily as needed for anxiety. 1 mg three times a day and 2 mg at bedtime   amitriptyline (ELAVIL) 50 MG tablet TAKE 1 TABLET BY MOUTH EVERYDAY AT BEDTIME   cetirizine (ZYRTEC) 10 MG tablet TAKE 1 TABLET BY MOUTH EVERY DAY   Cholecalciferol (VITAMIN D3) 125 MCG (5000 UT) CAPS Take by mouth.   conjugated estrogens (PREMARIN) vaginal cream Estrogen Cream Instruction Discard applicator Apply pea sized amount to tip of finger to urethra before bed. Wash hands well after application. Use Monday, Wednesday and Friday   DENTAGEL 1.1 % GEL dental gel Place onto teeth daily.   fluticasone (FLONASE) 50 MCG/ACT nasal spray SPRAY 2 SPRAYS INTO EACH NOSTRIL EVERY DAY   triamcinolone cream (KENALOG) 0.5 % Apply 1 Application topically 3 (three) times daily.   valsartan (DIOVAN) 80 MG tablet Take 1 tablet (80 mg total) by mouth daily.   Vibegron (GEMTESA) 75 MG TABS Take 1 tablet (75 mg total) by mouth daily.   [DISCONTINUED] benzonatate (TESSALON) 100 MG capsule Take 1 capsule (100 mg total) by mouth 3 (three) times daily as needed for cough.   [DISCONTINUED] erythromycin ophthalmic ointment Place 1  Application into both eyes 4 (four) times daily. For 7-10 day   [DISCONTINUED] ondansetron (ZOFRAN-ODT) 4 MG disintegrating tablet Take 1 tablet (4 mg total) by mouth every 8 (eight) hours as needed for nausea or vomiting.   No facility-administered medications prior to visit.    Review of Systems  Constitutional: Negative.   HENT: Negative.  Negative for congestion and sinus pressure.   Eyes: Negative.   Respiratory: Negative.  Negative for cough and shortness of breath.   Cardiovascular: Negative.  Negative for chest pain.  Gastrointestinal: Negative.  Negative for abdominal pain, constipation and diarrhea.  Genitourinary:  Positive for flank pain and hematuria.  Musculoskeletal:  Negative for joint pain and myalgias.  Skin: Negative.   Neurological: Negative.  Negative for dizziness and headaches.  Endo/Heme/Allergies: Negative.   All other systems reviewed and are negative.      Objective:   BP 132/80   Pulse 85   Ht 5\' 5"  (1.651 m)   Wt 172 lb (78 kg)   LMP  (LMP Unknown) Comment: last cycle over a year ago  SpO2 96%   BMI 28.62 kg/m   Vitals:   09/24/23 1413  BP: 132/80  Pulse: 85  Height: 5\' 5"  (1.651 m)  Weight: 172 lb (78 kg)  SpO2: 96%  BMI (Calculated): 28.62    Physical Exam Vitals and nursing note reviewed.  Constitutional:      Appearance: Normal appearance. She is normal weight.  HENT:     Head: Normocephalic and atraumatic.     Nose: Nose normal.     Mouth/Throat:     Mouth: Mucous membranes are moist.  Eyes:     Extraocular Movements: Extraocular movements intact.     Conjunctiva/sclera: Conjunctivae normal.     Pupils: Pupils are equal, round, and reactive to light.  Cardiovascular:     Rate and Rhythm: Normal rate and regular rhythm.     Pulses: Normal pulses.     Heart sounds: Normal heart sounds.  Pulmonary:     Effort: Pulmonary effort is normal.     Breath sounds: Normal breath sounds.  Abdominal:     General: Abdomen is flat.  Bowel sounds are normal.     Palpations: Abdomen is soft.  Musculoskeletal:        General: Normal range of motion.     Cervical back: Normal range of motion.  Skin:    General: Skin is warm and dry.  Neurological:     General: No focal deficit present.     Mental Status: She is alert and oriented to person, place, and time.  Psychiatric:        Mood and Affect: Mood normal.        Behavior: Behavior normal.        Thought Content: Thought content normal.        Judgment: Judgment normal.      Results for orders placed or performed in visit on 09/24/23  POCT Urinalysis Dipstick (81002)  Result Value Ref Range   Color, UA     Clarity, UA     Glucose, UA Negative Negative   Bilirubin, UA Negative    Ketones, UA Negative    Spec Grav, UA 1.025 1.010 - 1.025   Blood, UA 2+    pH, UA 6.0 5.0 - 8.0   Protein, UA Negative Negative   Urobilinogen, UA 0.2 0.2 or 1.0 E.U./dL   Nitrite, UA Negative    Leukocytes, UA Negative Negative   Appearance     Odor      Recent Results (from the past 2160 hours)  POCT Urinalysis Dipstick ([id number])     Status: Abnormal   Collection Time: 09/24/23  2:28 PM  Result Value Ref Range   Color, UA     Clarity, UA     Glucose, UA Negative Negative   Bilirubin, UA Negative    Ketones, UA Negative    Spec Grav, UA 1.025 1.010 - 1.025   Blood, UA 2+    pH, UA 6.0 5.0 - 8.0   Protein, UA Negative Negative   Urobilinogen, UA 0.2 0.2 or 1.0 E.U./dL   Nitrite, UA Negative    Leukocytes, UA Negative Negative   Appearance     Odor  Assessment & Plan:  Urine culture Clindamycin Advair  Problem List Items Addressed This Visit       Other   Abnormal urine - Primary   Relevant Orders   POCT Urinalysis Dipstick (81191) (Completed)   Urine Culture    Return if symptoms worsen or fail to improve, for as scheduled.   Total time spent: 25 minutes  Google, NP  09/24/2023   This document may have been prepared by Dragon  Voice Recognition software and as such may include unintentional dictation errors.

## 2023-09-25 ENCOUNTER — Ambulatory Visit: Payer: Medicaid Other | Admitting: Cardiology

## 2023-09-25 MED ORDER — BUDESONIDE-FORMOTEROL FUMARATE 80-4.5 MCG/ACT IN AERO: 2.0000 | INHALATION_SPRAY | Freq: Two times a day (BID) | RESPIRATORY_TRACT | 12 refills | Status: AC

## 2023-09-26 LAB — URINE CULTURE

## 2023-09-28 NOTE — Progress Notes (Signed)
 Informed via Mychart message
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
## 2023-10-28 ENCOUNTER — Encounter: Payer: Self-pay | Admitting: Obstetrics and Gynecology

## 2023-10-28 ENCOUNTER — Ambulatory Visit: Admitting: Obstetrics and Gynecology

## 2023-10-28 VITALS — BP 124/63 | HR 83 | Ht 65.0 in | Wt 177.5 lb

## 2023-10-28 DIAGNOSIS — N951 Menopausal and female climacteric states: Secondary | ICD-10-CM | POA: Diagnosis not present

## 2023-10-28 DIAGNOSIS — N83201 Unspecified ovarian cyst, right side: Secondary | ICD-10-CM | POA: Diagnosis not present

## 2023-10-28 DIAGNOSIS — R102 Pelvic and perineal pain: Secondary | ICD-10-CM

## 2023-10-28 DIAGNOSIS — N3011 Interstitial cystitis (chronic) with hematuria: Secondary | ICD-10-CM | POA: Diagnosis not present

## 2023-10-28 DIAGNOSIS — Z8041 Family history of malignant neoplasm of ovary: Secondary | ICD-10-CM | POA: Diagnosis not present

## 2023-10-28 DIAGNOSIS — R1030 Lower abdominal pain, unspecified: Secondary | ICD-10-CM | POA: Diagnosis not present

## 2023-10-28 LAB — POCT URINALYSIS DIPSTICK
Bilirubin, UA: NEGATIVE
Blood, UA: POSITIVE
Glucose, UA: NEGATIVE
Ketones, UA: NEGATIVE
Leukocytes, UA: NEGATIVE
Nitrite, UA: NEGATIVE
Protein, UA: NEGATIVE
Spec Grav, UA: <=1.005 — AB (ref 1.010–1.025)
Urobilinogen, UA: 0.2 U/dL
pH, UA: 6 (ref 5.0–8.0)

## 2023-11-04 ENCOUNTER — Encounter: Payer: Self-pay | Admitting: Cardiology

## 2023-11-09 ENCOUNTER — Encounter: Payer: Self-pay | Admitting: Cardiology

## 2023-11-09 ENCOUNTER — Ambulatory Visit: Admitting: Cardiology

## 2023-11-09 VITALS — BP 152/82 | HR 84 | Ht 65.0 in | Wt 175.0 lb

## 2023-11-09 DIAGNOSIS — N3011 Interstitial cystitis (chronic) with hematuria: Secondary | ICD-10-CM

## 2023-11-09 DIAGNOSIS — J0110 Acute frontal sinusitis, unspecified: Secondary | ICD-10-CM | POA: Diagnosis not present

## 2023-11-09 DIAGNOSIS — R8290 Unspecified abnormal findings in urine: Secondary | ICD-10-CM | POA: Diagnosis not present

## 2023-11-09 DIAGNOSIS — N200 Calculus of kidney: Secondary | ICD-10-CM

## 2023-11-09 DIAGNOSIS — R350 Frequency of micturition: Secondary | ICD-10-CM

## 2023-11-09 DIAGNOSIS — R300 Dysuria: Secondary | ICD-10-CM | POA: Diagnosis not present

## 2023-11-09 DIAGNOSIS — I10 Essential (primary) hypertension: Secondary | ICD-10-CM

## 2023-11-09 LAB — POCT URINALYSIS DIPSTICK
Bilirubin, UA: NEGATIVE
Glucose, UA: NEGATIVE
Ketones, UA: NEGATIVE
Leukocytes, UA: NEGATIVE
Nitrite, UA: NEGATIVE
Protein, UA: NEGATIVE
Spec Grav, UA: 1.01 (ref 1.010–1.025)
Urobilinogen, UA: 0.2 U/dL
pH, UA: 6 (ref 5.0–8.0)

## 2023-11-09 MED ORDER — CLINDAMYCIN HCL 300 MG PO CAPS: 300.0000 mg | ORAL_CAPSULE | Freq: Two times a day (BID) | ORAL | 0 refills | Status: AC

## 2023-11-09 NOTE — Progress Notes (Signed)
 Established Patient Office Visit  Subjective:  Patient ID: Shirley [name], female    DOB: 1971/07/15  Age: 53 y.o. MRN: [id number]  Chief Complaint  Patient presents with   Acute Visit    Coughing and Possible UTI    Patient in office for an acute visit, possible UTI. Complains of burning on urination. UA positive for blood, no leukocytes. Will send for culture.  Patient has sinus pain, congestion, cough, yellow sputum. Will send in Clindamycin. Recommend mucinex, Flonase, Zyrtec, drink plenty of water.   Cough This is a new problem. The current episode started in the past 7 days. The problem has been unchanged. The problem occurs every few minutes. The cough is Productive of sputum and productive of purulent sputum. Associated symptoms include a fever, nasal congestion, postnasal drip and rhinorrhea. Pertinent negatives include no chest pain, headaches, myalgias or shortness of breath. Nothing aggravates the symptoms. Treatments tried: Coricidin. The treatment provided no relief.  Urinary Tract Infection  This is a recurrent problem. The current episode started in the past 7 days. The problem occurs every urination. The problem has been unchanged. The quality of the pain is described as burning. The patient is experiencing no pain. The maximum temperature recorded prior to her arrival was 102 - 102.9 F. The fever has been present for Less than 1 day. There is A history of pyelonephritis.    No other concerns at this time.   Past Medical History:  Diagnosis Date   Abnormal Pap smear of cervix    ADHD (attention deficit hyperactivity disorder)    Anxiety    Frequent headaches    Hypertension    Mitral valve prolapse    Ovarian cyst    Spina bifida occulta    UTI (urinary tract infection)     Past Surgical History:  Procedure Laterality Date   LEEP      Social History   Socioeconomic History   Marital status: Single    Spouse name: Not on file   Number of children: 2    Years of education: Not on file   Highest education level: Not on file  Occupational History   Not on file  Tobacco Use   Smoking status: Never    Passive exposure: Never   Smokeless tobacco: Never  Vaping Use   Vaping status: Never Used  Substance and Sexual Activity   Alcohol use: Not Currently   Drug use: Never   Sexual activity: Yes    Partners: Male    Birth control/protection: None  Other Topics Concern   Not on file  Social History Narrative   Not on file   Social Drivers of Health   Financial Resource Strain: Medium Risk (03/27/2021)   Received from [facility]   Overall Financial Resource Strain (CARDIA)    Difficulty of Paying Living Expenses: Somewhat hard  Food Insecurity: Not on file  Transportation Needs: Unmet Transportation Needs (03/27/2021)   Received from [facility], [facility]   PRAPARE - Transportation    Lack of Transportation (Medical): Yes    Lack of Transportation (Non-Medical): Yes  Physical Activity: Not on file  Stress: Not on file  Social Connections: Not on file  Intimate Partner Violence: Not on file    Family History  Problem Relation Age of Onset   Ovarian cancer Mother    Pancreatic cancer Mother    Diabetes Mellitus II Mother    Stroke Father  Heart disease Father    Cancer Father    Diabetes Mellitus II Father    Coronary artery disease Father    Mental illness Father    Anxiety disorder Sister    Mitral valve prolapse Sister    Heart attack Sister    Drug abuse Daughter     Allergies  Allergen Reactions   Bactrim [Sulfamethoxazole-Trimethoprim] Anaphylaxis   Gadolinium Anaphylaxis   Gadolinium Derivatives Anaphylaxis   Iodinated Contrast Media Anaphylaxis   Iodine Anaphylaxis   Other Nausea And Vomiting, Rash and Hives    Uncoded Allergy. Allergen: BEZONZTATE Uncoded Allergy. Allergen: decongestants   Sulfa Antibiotics Anaphylaxis and Other (See Comments)    Uncoded Allergy.  Allergen: decongestants Uncoded Allergy. Allergen: decongestants    Azithromycin Hives   Clarithromycin Other (See Comments)   Furosemide Other (See Comments)   Guaifenesin Nausea And Vomiting   Macrobid [Nitrofurantoin Monohyd Macro] Swelling    Tongue swelling, chest tightness    Nitrofurantoin Hives   Penicillins Hives   Allegra [Fexofenadine] Palpitations    Heart Racing    Amoxicillin Rash   Doxycycline Rash and Other (See Comments)   Keflex [Cephalexin] Rash and Other (See Comments)   Levofloxacin Rash    Outpatient Medications Prior to Visit  Medication Sig   albuterol (VENTOLIN HFA) 108 (90 Base) MCG/ACT inhaler Inhale 1-2 puffs into the lungs every 4 (four) hours as needed for wheezing or shortness of breath.   ALPRAZolam (XANAX) 1 MG tablet Take 1 mg by mouth 3 (three) times daily as needed for anxiety. 1 mg three times a day and 2 mg at bedtime   amitriptyline (ELAVIL) 50 MG tablet TAKE 1 TABLET BY MOUTH EVERYDAY AT BEDTIME   amphetamine-dextroamphetamine (ADDERALL XR) 20 MG 24 hr capsule Take 20 mg by mouth every morning.   budesonide-formoterol (SYMBICORT) 80-4.5 MCG/ACT inhaler Inhale 2 puffs into the lungs 2 (two) times daily.   cetirizine (ZYRTEC) 10 MG tablet TAKE 1 TABLET BY MOUTH EVERY DAY   Cholecalciferol (VITAMIN D3) 125 MCG (5000 UT) CAPS Take by mouth.   conjugated estrogens (PREMARIN) vaginal cream Estrogen Cream Instruction Discard applicator Apply pea sized amount to tip of finger to urethra before bed. Wash hands well after application. Use Monday, Wednesday and Friday   DENTAGEL 1.1 % GEL dental gel Place onto teeth daily.   fluticasone (FLONASE) 50 MCG/ACT nasal spray SPRAY 2 SPRAYS INTO EACH NOSTRIL EVERY DAY   triamcinolone cream (KENALOG) 0.5 % Apply 1 Application topically 3 (three) times daily.   valsartan (DIOVAN) 80 MG tablet Take 1 tablet (80 mg total) by mouth daily.   No facility-administered medications prior to visit.    Review of Systems   Constitutional:  Positive for fever.  HENT:  Positive for postnasal drip and rhinorrhea.   Eyes: Negative.   Respiratory:  Positive for cough. Negative for shortness of breath.   Cardiovascular: Negative.  Negative for chest pain.  Gastrointestinal: Negative.  Negative for abdominal pain, constipation and diarrhea.  Genitourinary: Negative.   Musculoskeletal:  Negative for joint pain and myalgias.  Skin: Negative.   Neurological: Negative.  Negative for dizziness and headaches.  Endo/Heme/Allergies: Negative.   All other systems reviewed and are negative.      Objective:   BP (!) 152/82   Pulse 84   Ht 5\' 5"  (1.651 m)   Wt 175 lb (79.4 kg)   LMP  (LMP Unknown) Comment: last cycle over a year ago  SpO2 98%   BMI 29.12  kg/m   Vitals:   11/09/23 1449  BP: (!) 152/82  Pulse: 84  Height: 5\' 5"  (1.651 m)  Weight: 175 lb (79.4 kg)  SpO2: 98%  BMI (Calculated): 29.12    Physical Exam Vitals and nursing note reviewed.  Constitutional:      Appearance: Normal appearance. She is normal weight.  HENT:     Head: Normocephalic and atraumatic.     Nose: Nose normal.     Mouth/Throat:     Mouth: Mucous membranes are moist.  Eyes:     Extraocular Movements: Extraocular movements intact.     Conjunctiva/sclera: Conjunctivae normal.     Pupils: Pupils are equal, round, and reactive to light.  Cardiovascular:     Rate and Rhythm: Normal rate and regular rhythm.     Pulses: Normal pulses.     Heart sounds: Normal heart sounds.  Pulmonary:     Effort: Pulmonary effort is normal.     Breath sounds: Normal breath sounds.  Abdominal:     General: Abdomen is flat. Bowel sounds are normal.     Palpations: Abdomen is soft.  Musculoskeletal:        General: Normal range of motion.     Cervical back: Normal range of motion.  Skin:    General: Skin is warm and dry.  Neurological:     General: No focal deficit present.     Mental Status: She is alert and oriented to person,  place, and time.  Psychiatric:        Mood and Affect: Mood normal.        Behavior: Behavior normal.        Thought Content: Thought content normal.        Judgment: Judgment normal.      Results for orders placed or performed in visit on 11/09/23  POCT Urinalysis Dipstick ([id number])  Result Value Ref Range   Color, UA     Clarity, UA     Glucose, UA Negative Negative   Bilirubin, UA Negative    Ketones, UA Negative    Spec Grav, UA 1.010 1.010 - 1.025   Blood, UA +    pH, UA 6.0 5.0 - 8.0   Protein, UA Negative Negative   Urobilinogen, UA 0.2 0.2 or 1.0 E.U./dL   Nitrite, UA Negative    Leukocytes, UA Negative Negative   Appearance     Odor      Recent Results (from the past 2160 hours)  Urine Culture     Status: None   Collection Time: 09/24/23  1:42 PM   Specimen: Urine   UR  Result Value Ref Range   Urine Culture, Routine Final report    Organism ID, Bacteria Comment     Comment: Mixed urogenital flora Less than 10,000 colonies/mL   POCT Urinalysis Dipstick ([id number])     Status: Abnormal   Collection Time: 09/24/23  2:28 PM  Result Value Ref Range   Color, UA     Clarity, UA     Glucose, UA Negative Negative   Bilirubin, UA Negative    Ketones, UA Negative    Spec Grav, UA 1.025 1.010 - 1.025   Blood, UA 2+    pH, UA 6.0 5.0 - 8.0   Protein, UA Negative Negative   Urobilinogen, UA 0.2 0.2 or 1.0 E.U./dL   Nitrite, UA Negative    Leukocytes, UA Negative Negative   Appearance     Odor    POCT Urinalysis Dipstick  Status: Abnormal   Collection Time: 10/28/23  1:25 PM  Result Value Ref Range   Color, UA     Clarity, UA     Glucose, UA Negative Negative   Bilirubin, UA Negative    Ketones, UA Negative    Spec Grav, UA <=1.005 (A) 1.010 - 1.025   Blood, UA Positive    pH, UA 6.0 5.0 - 8.0   Protein, UA Negative Negative   Urobilinogen, UA 0.2 0.2 or 1.0 E.U./dL   Nitrite, UA Negative    Leukocytes, UA Negative Negative   Appearance     Odor     POCT Urinalysis Dipstick ([id number])     Status: Abnormal   Collection Time: 11/09/23  2:50 PM  Result Value Ref Range   Color, UA     Clarity, UA     Glucose, UA Negative Negative   Bilirubin, UA Negative    Ketones, UA Negative    Spec Grav, UA 1.010 1.010 - 1.025   Blood, UA +    pH, UA 6.0 5.0 - 8.0   Protein, UA Negative Negative   Urobilinogen, UA 0.2 0.2 or 1.0 E.U./dL   Nitrite, UA Negative    Leukocytes, UA Negative Negative   Appearance     Odor        Assessment & Plan:  Clindamycin Mucinex Flonase Zyrtec Drink plenty of water  Problem List Items Addressed This Visit       Respiratory   Acute non-recurrent frontal sinusitis   Relevant Medications   clindamycin (CLEOCIN) 300 MG capsule     Genitourinary   Interstitial cystitis (chronic) with hematuria   Relevant Orders   Ambulatory referral to Urology   Renal stone   Relevant Orders   Ambulatory referral to Urology     Other   Urinary frequency - Primary   Relevant Orders   POCT Urinalysis Dipstick ([id number]) (Completed)   Urine Culture   Ambulatory referral to Urology   Dysuria   Relevant Orders   Ambulatory referral to Urology   Abnormal urine   Relevant Orders   Ambulatory referral to Urology    Return if symptoms worsen or fail to improve.   Total time spent: 25 minutes  [employer], NP  11/09/2023   This document may have been prepared by Dragon Voice Recognition software and as such may include unintentional dictation errors.

## 2023-11-11 LAB — URINE CULTURE

## 2023-11-12 ENCOUNTER — Other Ambulatory Visit: Payer: Self-pay | Admitting: Cardiology

## 2023-11-12 ENCOUNTER — Ambulatory Visit: Payer: Medicaid Other | Admitting: Cardiology

## 2023-11-12 DIAGNOSIS — R8290 Unspecified abnormal findings in urine: Secondary | ICD-10-CM

## 2023-11-12 DIAGNOSIS — N3011 Interstitial cystitis (chronic) with hematuria: Secondary | ICD-10-CM

## 2023-11-12 DIAGNOSIS — R350 Frequency of micturition: Secondary | ICD-10-CM

## 2023-11-23 ENCOUNTER — Other Ambulatory Visit: Payer: Self-pay | Admitting: Cardiology

## 2023-11-23 DIAGNOSIS — S50861A Insect bite (nonvenomous) of right forearm, initial encounter: Secondary | ICD-10-CM

## 2023-11-23 MED ORDER — TRIAMCINOLONE ACETONIDE 0.5 % EX CREA: 1.0000 | TOPICAL_CREAM | Freq: Three times a day (TID) | CUTANEOUS | 0 refills | Status: DC

## 2023-11-25 ENCOUNTER — Ambulatory Visit (INDEPENDENT_AMBULATORY_CARE_PROVIDER_SITE_OTHER)

## 2023-11-25 DIAGNOSIS — N83201 Unspecified ovarian cyst, right side: Secondary | ICD-10-CM | POA: Diagnosis not present

## 2023-11-25 DIAGNOSIS — R102 Pelvic and perineal pain: Secondary | ICD-10-CM

## 2023-11-26 ENCOUNTER — Ambulatory Visit: Admitting: Obstetrics and Gynecology

## 2023-12-06 ENCOUNTER — Other Ambulatory Visit: Payer: Self-pay | Admitting: Cardiology

## 2023-12-06 DIAGNOSIS — N3010 Interstitial cystitis (chronic) without hematuria: Secondary | ICD-10-CM

## 2023-12-09 ENCOUNTER — Ambulatory Visit: Admitting: Obstetrics and Gynecology

## 2023-12-09 ENCOUNTER — Encounter: Payer: Self-pay | Admitting: Obstetrics and Gynecology

## 2023-12-09 VITALS — BP 104/70 | HR 76 | Ht 65.0 in | Wt 177.9 lb

## 2023-12-09 DIAGNOSIS — R102 Pelvic and perineal pain: Secondary | ICD-10-CM | POA: Diagnosis not present

## 2023-12-09 DIAGNOSIS — R300 Dysuria: Secondary | ICD-10-CM | POA: Diagnosis not present

## 2023-12-09 DIAGNOSIS — N83201 Unspecified ovarian cyst, right side: Secondary | ICD-10-CM | POA: Diagnosis not present

## 2023-12-09 DIAGNOSIS — N3011 Interstitial cystitis (chronic) with hematuria: Secondary | ICD-10-CM | POA: Diagnosis not present

## 2023-12-09 DIAGNOSIS — R1030 Lower abdominal pain, unspecified: Secondary | ICD-10-CM | POA: Diagnosis not present

## 2023-12-09 LAB — POCT URINALYSIS DIPSTICK
Bilirubin, UA: NEGATIVE
Glucose, UA: NEGATIVE
Ketones, UA: NEGATIVE
Leukocytes, UA: NEGATIVE
Nitrite, UA: NEGATIVE
Protein, UA: NEGATIVE
Spec Grav, UA: 1.01 (ref 1.010–1.025)
Urobilinogen, UA: 0.2 U/dL
pH, UA: 6 (ref 5.0–8.0)

## 2023-12-09 NOTE — Progress Notes (Signed)
 HPI:      [name] is a 53 y.o. [phone] who LMP was No LMP recorded (lmp unknown). Patient is postmenopausal.  Subjective:   She presents today ostensibly as a follow-up to her right ovarian cyst.  This has been present for approximately 2 years.  It has increased slightly in size.  The patient remains relatively asymptomatic from this. She also has a mass in the uterus which appears to be a fibroid but she has repeatedly declined transvaginal ultrasonography making exact characterization difficult.  She says that with her next ultrasound she will allow transvaginal ultrasonography for better imaging. She often goes off topic when discussing her health as she reports that she has multiple medical issues.  She is mainly complaining of bladder symptoms today with blood in her urine, pushing to urinate, damage during childbirth, pelvic pressure symptoms and pelvic pain when she has to urinate. (She also describes a possible history of interstitial cystitis but she is convinced she does not have this)     Hx: The following portions of the patient's history were reviewed and updated as appropriate:             She  has a past medical history of Abnormal Pap smear of cervix, ADHD (attention deficit hyperactivity disorder), Anxiety, Frequent headaches, Hypertension, Mitral valve prolapse, Ovarian cyst, Spina bifida occulta, and UTI (urinary tract infection). She does not have any pertinent problems on file. She  has a past surgical history that includes LEEP. Her family history includes Anxiety disorder in her sister; Cancer in her father; Coronary artery disease in her father; Diabetes Mellitus II in her father and mother; Drug abuse in her daughter; Heart attack in her sister; Heart disease in her father; Mental illness in her father; Mitral valve prolapse in her sister; Ovarian cancer in her mother; Pancreatic cancer in her mother; Stroke in her father. She  reports that she has never smoked.  She has never been exposed to tobacco smoke. She has never used smokeless tobacco. She reports that she does not currently use alcohol. She reports that she does not use drugs. She has a current medication list which includes the following prescription(s): albuterol , alprazolam , amitriptyline , amphetamine-dextroamphetamine, budesonide -formoterol , cetirizine , vitamin d3, premarin , dentagel, fluticasone , triamcinolone  cream, and valsartan . She is allergic to bactrim [sulfamethoxazole-trimethoprim], gadolinium, gadolinium derivatives, iodinated contrast media, iodine, other, sulfa antibiotics, azithromycin, clarithromycin, furosemide, guaifenesin, macrobid [nitrofurantoin monohyd macro], nitrofurantoin, penicillins, allegra [fexofenadine], amoxicillin, doxycycline, keflex [cephalexin], and levofloxacin.       Review of Systems:  Review of Systems  Constitutional: Denied constitutional symptoms, night sweats, recent illness, fatigue, fever, insomnia and weight loss.  Eyes: Denied eye symptoms, eye pain, photophobia, vision change and visual disturbance.  Ears/Nose/Throat/Neck: Denied ear, nose, throat or neck symptoms, hearing loss, nasal discharge, sinus congestion and sore throat.  Cardiovascular: Denied cardiovascular symptoms, arrhythmia, chest pain/pressure, edema, exercise intolerance, orthopnea and palpitations.  Respiratory: Denied pulmonary symptoms, asthma, pleuritic pain, productive sputum, cough, dyspnea and wheezing.  Gastrointestinal: Denied, gastro-esophageal reflux, melena, nausea and vomiting.  Genitourinary: See HPI for additional information.  Musculoskeletal: Denied musculoskeletal symptoms, stiffness, swelling, muscle weakness and myalgia.  Dermatologic: Denied dermatology symptoms, rash and scar.  Neurologic: Denied neurology symptoms, dizziness, headache, neck pain and syncope.  Psychiatric: Denied psychiatric symptoms, anxiety and depression.  Endocrine: Denied endocrine  symptoms including hot flashes and night sweats.   Meds:   Current Outpatient Medications on File Prior to Visit  Medication Sig Dispense Refill   albuterol  (VENTOLIN   HFA) 108 (90 Base) MCG/ACT inhaler Inhale 1-2 puffs into the lungs every 4 (four) hours as needed for wheezing or shortness of breath. 6.7 each 5   ALPRAZolam  (XANAX ) 1 MG tablet Take 1 mg by mouth 3 (three) times daily as needed for anxiety. 1 mg three times a day and 2 mg at bedtime     amitriptyline  (ELAVIL ) 50 MG tablet TAKE 1 TABLET BY MOUTH EVERYDAY AT BEDTIME 90 tablet 1   amphetamine-dextroamphetamine (ADDERALL XR) 20 MG 24 hr capsule Take 20 mg by mouth every morning.     budesonide -formoterol  (SYMBICORT ) 80-4.5 MCG/ACT inhaler Inhale 2 puffs into the lungs 2 (two) times daily. 30.6 g 12   cetirizine  (ZYRTEC ) 10 MG tablet TAKE 1 TABLET BY MOUTH EVERY DAY 90 tablet 0   Cholecalciferol (VITAMIN D3) 125 MCG (5000 UT) CAPS Take by mouth.     conjugated estrogens  (PREMARIN ) vaginal cream Estrogen Cream Instruction Discard applicator Apply pea sized amount to tip of finger to urethra before bed. Wash hands well after application. Use Monday, Wednesday and Friday 42.5 g 12   DENTAGEL 1.1 % GEL dental gel Place onto teeth daily.     fluticasone  (FLONASE ) 50 MCG/ACT nasal spray SPRAY 2 SPRAYS INTO EACH NOSTRIL EVERY DAY 48 mL 1   triamcinolone  cream (KENALOG ) 0.5 % Apply 1 Application topically 3 (three) times daily. 30 g 0   valsartan  (DIOVAN ) 80 MG tablet Take 1 tablet (80 mg total) by mouth daily. 30 tablet 11   No current facility-administered medications on file prior to visit.      Objective:      Vitals:   12/09/23 1438  BP: 104/70  Pulse: 76   Filed Weights   12/09/23 1438  Weight: 177 lb 14.4 oz (80.7 kg)              Ultrasound results reviewed, CT results reviewed          Assessment:     [id number] Patient Active Problem List   Diagnosis Date Noted   Acute non-recurrent frontal sinusitis  11/09/2023   Abnormal urine 09/24/2023   Psychophysiological insomnia 12/04/2022   Essential hypertension 01/01/2022   Generalized anxiety disorder with panic attacks 08/26/2021   Major depressive disorder, recurrent, moderate (HCC) 08/26/2021   Attention deficit hyperactivity disorder (ADHD), combined type 08/26/2021   PTSD (post-traumatic stress disorder) 08/26/2021   Mild persistent asthma without complication 08/26/2021   Dysuria 01/22/2021   Hyperlipidemia 12/31/2020   Renal stone 12/31/2020   Hidradenitis suppurativa of left axilla 12/31/2020   Interstitial cystitis (chronic) with hematuria 11/15/2020   Perimenopause 09/19/2016   Family history of ovarian cancer 09/19/2016   Family history of colon cancer 09/19/2016   Anxiety 09/19/2016   Cyst of right ovary 09/19/2016   Abnormal uterine bleeding (AUB) 09/19/2016   Urinary frequency 09/19/2016   Status post LEEP (loop electrosurgical excision procedure) of cervix 09/19/2016     1. Cyst of right ovary   2. Lower abdominal pain   3. Pelvic pain   4. Interstitial cystitis (chronic) with hematuria   5. Dysuria     Ovarian cyst slightly larger than at last imaging.  Patient relatively asymptomatic.  She reports that she "has never had surgery and would like to continue following the cyst".  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 She also may have a small fibroid in the uterus but because she is menopausal I suspect any problems  from this have resolved.  Better characterization may be achieved using vaginal ultrasonography. Multiple bladder symptoms which seems to be her main complaint today.  She reports that she has previously seen multiple urologists without satisfaction.  Plan:            1.  Offered patient option of surgical removal of right ovarian cyst and likely left ovary as well.  She declined this option and would like to follow-up ultrasound in 6 months at  which time she reports she will undergo transvaginal ultrasound for better visualization.  Low risk of malignancy based on appearance by ultrasound -but not impossible.  Small risk of torsion discussed with patient.  Small risk of rupture reviewed.  2.  She requests referral to urology for follow-up of her bladder issues.  She does have moderate blood in her urine today but no other evidence of UTI.  Urine sent for culture.  Orders Orders Placed This Encounter  Procedures   Urine Culture   US  PELVIS (TRANSABDOMINAL ONLY)   US  PELVIS TRANSVAGINAL NON-OB (TV ONLY)   Ambulatory referral to Urology   POCT Urinalysis Dipstick    No orders of the defined types were placed in this encounter.     F/U  No follow-ups on file.  Delice Felt, M.D. 12/09/2023 3:24 PM

## 2023-12-09 NOTE — Progress Notes (Signed)
 Patient presents today to follow-up on recent ultrasound. Complaints of UTI symptoms today.

## 2023-12-11 ENCOUNTER — Ambulatory Visit: Payer: Self-pay

## 2023-12-11 LAB — URINE CULTURE

## 2023-12-11 NOTE — Telephone Encounter (Signed)
 Spoke with pt on phone. All questions answered.

## 2023-12-15 ENCOUNTER — Ambulatory Visit: Admitting: Cardiology

## 2023-12-15 ENCOUNTER — Ambulatory Visit: Payer: Self-pay | Admitting: Cardiology

## 2023-12-15 DIAGNOSIS — R350 Frequency of micturition: Secondary | ICD-10-CM

## 2023-12-15 LAB — POCT URINALYSIS DIPSTICK
Bilirubin, UA: NEGATIVE
Glucose, UA: NEGATIVE
Ketones, UA: NEGATIVE
Leukocytes, UA: NEGATIVE
Nitrite, UA: NEGATIVE
Protein, UA: NEGATIVE
Spec Grav, UA: 1.01 (ref 1.010–1.025)
Urobilinogen, UA: 0.2 U/dL
pH, UA: 6 (ref 5.0–8.0)

## 2023-12-28 ENCOUNTER — Encounter: Payer: Self-pay | Admitting: Cardiology

## 2023-12-28 ENCOUNTER — Ambulatory Visit: Payer: Self-pay | Admitting: Cardiology

## 2023-12-28 ENCOUNTER — Ambulatory Visit: Admitting: Cardiology

## 2023-12-28 VITALS — BP 130/80 | HR 79 | Ht 65.0 in | Wt 177.6 lb

## 2023-12-28 DIAGNOSIS — Z0130 Encounter for examination of blood pressure without abnormal findings: Secondary | ICD-10-CM

## 2023-12-28 DIAGNOSIS — R8290 Unspecified abnormal findings in urine: Secondary | ICD-10-CM | POA: Diagnosis not present

## 2023-12-28 DIAGNOSIS — R1031 Right lower quadrant pain: Secondary | ICD-10-CM | POA: Diagnosis not present

## 2023-12-28 LAB — POCT URINALYSIS DIPSTICK
Bilirubin, UA: NEGATIVE
Blood, UA: 1
Glucose, UA: NEGATIVE
Ketones, UA: NEGATIVE
Leukocytes, UA: NEGATIVE
Nitrite, UA: NEGATIVE
Protein, UA: NEGATIVE
Spec Grav, UA: 1.01 (ref 1.010–1.025)
Urobilinogen, UA: 0.2 U/dL
pH, UA: 6 (ref 5.0–8.0)

## 2023-12-28 NOTE — Progress Notes (Signed)
 Established Patient Office Visit  Subjective:  Patient ID: Shirley [name], female    DOB: [birth year]  Age: 53 y.o. MRN: [id number]  Chief Complaint  Patient presents with   Acute Visit    Burning, pain, lower back pain    Patient in office for an acute visit, complaining of burning on urination, pain, low back pain. Patient complains of right sided abdominal pain, edema. UA today unremarkable, will send for culture. Patient scheduled to see urologist in August 2025. Multiple recent urine cultures, all negative. Patient abdomen does not appear swollen on exam. Will order an abdominal ultrasound for abdominal pain.  Patient seeing GYN for ovarian cysts. Has appointment on 01/05/2024.     No other concerns at this time.   Past Medical History:  Diagnosis Date   Abnormal Pap smear of cervix    ADHD (attention deficit hyperactivity disorder)    Anxiety    Frequent headaches    Hypertension    Mitral valve prolapse    Ovarian cyst    Spina bifida occulta    UTI (urinary tract infection)     Past Surgical History:  Procedure Laterality Date   LEEP      Social History   Socioeconomic History   Marital status: Single    Spouse name: Not on file   Number of children: 2   Years of education: Not on file   Highest education level: Not on file  Occupational History   Not on file  Tobacco Use   Smoking status: Never    Passive exposure: Never   Smokeless tobacco: Never  Vaping Use   Vaping status: Never Used  Substance and Sexual Activity   Alcohol use: Not Currently   Drug use: Never   Sexual activity: Yes    Partners: Male    Birth control/protection: Post-menopausal  Other Topics Concern   Not on file  Social History Narrative   Not on file   Social Drivers of Health   Financial Resource Strain: Medium Risk (03/27/2021)   Received from [facility], [facility]   Overall Financial Resource Strain (CARDIA)    Difficulty of Paying Living Expenses:  Somewhat hard  Food Insecurity: Not on file  Transportation Needs: Unmet Transportation Needs (03/27/2021)   Received from [facility], [facility]   PRAPARE - Transportation    Lack of Transportation (Medical): Yes    Lack of Transportation (Non-Medical): Yes  Physical Activity: Not on file  Stress: Not on file  Social Connections: Not on file  Intimate Partner Violence: Not on file    Family History  Problem Relation Age of Onset   Ovarian cancer Mother    Pancreatic cancer Mother    Diabetes Mellitus II Mother    Stroke Father    Heart disease Father    Cancer Father    Diabetes Mellitus II Father    Coronary artery disease Father    Mental illness Father    Anxiety disorder Sister    Mitral valve prolapse Sister    Heart attack Sister    Drug abuse Daughter     Allergies  Allergen Reactions   Bactrim [Sulfamethoxazole-Trimethoprim] Anaphylaxis   Gadolinium Anaphylaxis   Gadolinium Derivatives Anaphylaxis   Iodinated Contrast Media Anaphylaxis   Iodine Anaphylaxis   Other Nausea And Vomiting, Rash and Hives    Uncoded Allergy. Allergen: BEZONZTATE Uncoded Allergy. Allergen: decongestants   Sulfa Antibiotics Anaphylaxis and Other (See Comments)    Uncoded  Allergy. Allergen: decongestants Uncoded Allergy. Allergen: decongestants    Azithromycin Hives   Clarithromycin Other (See Comments)   Furosemide Other (See Comments)   Guaifenesin Nausea And Vomiting   Macrobid [Nitrofurantoin Monohyd Macro] Swelling    Tongue swelling, chest tightness    Nitrofurantoin Hives   Penicillins Hives   Allegra [Fexofenadine] Palpitations    Heart Racing    Amoxicillin Rash   Doxycycline Rash and Other (See Comments)   Keflex [Cephalexin] Rash and Other (See Comments)   Levofloxacin Rash    Outpatient Medications Prior to Visit  Medication Sig   albuterol  (VENTOLIN  HFA) 108 (90 Base) MCG/ACT inhaler Inhale 1-2 puffs into the lungs every 4 (four) hours as needed for  wheezing or shortness of breath.   ALPRAZolam  (XANAX ) 1 MG tablet Take 1 mg by mouth 3 (three) times daily as needed for anxiety. 1 mg three times a day and 2 mg at bedtime   amitriptyline  (ELAVIL ) 50 MG tablet TAKE 1 TABLET BY MOUTH EVERYDAY AT BEDTIME   amphetamine-dextroamphetamine (ADDERALL XR) 20 MG 24 hr capsule Take 20 mg by mouth every morning.   budesonide -formoterol  (SYMBICORT ) 80-4.5 MCG/ACT inhaler Inhale 2 puffs into the lungs 2 (two) times daily.   cetirizine  (ZYRTEC ) 10 MG tablet TAKE 1 TABLET BY MOUTH EVERY DAY   Cholecalciferol (VITAMIN D3) 125 MCG (5000 UT) CAPS Take by mouth.   conjugated estrogens  (PREMARIN ) vaginal cream Estrogen Cream Instruction Discard applicator Apply pea sized amount to tip of finger to urethra before bed. Wash hands well after application. Use Monday, Wednesday and Friday   DENTAGEL 1.1 % GEL dental gel Place onto teeth daily.   fluticasone  (FLONASE ) 50 MCG/ACT nasal spray SPRAY 2 SPRAYS INTO EACH NOSTRIL EVERY DAY   triamcinolone  cream (KENALOG ) 0.5 % Apply 1 Application topically 3 (three) times daily.   valsartan  (DIOVAN ) 80 MG tablet Take 1 tablet (80 mg total) by mouth daily.   No facility-administered medications prior to visit.    Review of Systems  Constitutional: Negative.   HENT: Negative.    Eyes: Negative.   Respiratory: Negative.  Negative for shortness of breath.   Cardiovascular: Negative.  Negative for chest pain.  Gastrointestinal:  Positive for abdominal pain. Negative for constipation and diarrhea.  Genitourinary:  Positive for dysuria and flank pain.  Musculoskeletal:  Negative for joint pain and myalgias.  Skin: Negative.   Neurological: Negative.  Negative for dizziness and headaches.  Endo/Heme/Allergies: Negative.   All other systems reviewed and are negative.      Objective:   BP 130/80   Pulse 79   Ht 5\' 5"  (1.651 m)   Wt 177 lb 9.6 oz (80.6 kg)   LMP  (LMP Unknown) Comment: last cycle over a year ago  SpO2  97%   BMI 29.55 kg/m   Vitals:   12/28/23 1336  BP: 130/80  Pulse: 79  Height: 5\' 5"  (1.651 m)  Weight: 177 lb 9.6 oz (80.6 kg)  SpO2: 97%  BMI (Calculated): 29.55    Physical Exam Vitals and nursing note reviewed.  Constitutional:      Appearance: Normal appearance. She is normal weight.  HENT:     Head: Normocephalic and atraumatic.     Nose: Nose normal.     Mouth/Throat:     Mouth: Mucous membranes are moist.  Eyes:     Extraocular Movements: Extraocular movements intact.     Conjunctiva/sclera: Conjunctivae normal.     Pupils: Pupils are equal, round, and reactive to  light.  Cardiovascular:     Rate and Rhythm: Normal rate and regular rhythm.     Pulses: Normal pulses.     Heart sounds: Normal heart sounds.  Pulmonary:     Effort: Pulmonary effort is normal.     Breath sounds: Normal breath sounds.  Abdominal:     General: Abdomen is flat. Bowel sounds are normal.     Palpations: Abdomen is soft.  Musculoskeletal:        General: Normal range of motion.     Cervical back: Normal range of motion.  Skin:    General: Skin is warm and dry.  Neurological:     General: No focal deficit present.     Mental Status: She is alert and oriented to person, place, and time.  Psychiatric:        Mood and Affect: Mood normal.        Behavior: Behavior normal.        Thought Content: Thought content normal.        Judgment: Judgment normal.      Results for orders placed or performed in visit on 12/28/23  POCT Urinalysis Dipstick (81002)  Result Value Ref Range   Color, UA clear    Clarity, UA clear    Glucose, UA Negative Negative   Bilirubin, UA negative    Ketones, UA negative    Spec Grav, UA 1.010 1.010 - 1.025   Blood, UA 1    pH, UA 6.0 5.0 - 8.0   Protein, UA Negative Negative   Urobilinogen, UA 0.2 0.2 or 1.0 E.U./dL   Nitrite, UA negative    Leukocytes, UA Negative Negative   Appearance clear    Odor none     Recent Results (from the past 2160  hours)  POCT Urinalysis Dipstick     Status: Abnormal   Collection Time: 10/28/23  1:25 PM  Result Value Ref Range   Color, UA     Clarity, UA     Glucose, UA Negative Negative   Bilirubin, UA Negative    Ketones, UA Negative    Spec Grav, UA <=1.005 (A) 1.010 - 1.025   Blood, UA Positive    pH, UA 6.0 5.0 - 8.0   Protein, UA Negative Negative   Urobilinogen, UA 0.2 0.2 or 1.0 E.U./dL   Nitrite, UA Negative    Leukocytes, UA Negative Negative   Appearance     Odor    POCT Urinalysis Dipstick ([id number])     Status: Abnormal   Collection Time: 11/09/23  2:50 PM  Result Value Ref Range   Color, UA     Clarity, UA     Glucose, UA Negative Negative   Bilirubin, UA Negative    Ketones, UA Negative    Spec Grav, UA 1.010 1.010 - 1.025   Blood, UA +    pH, UA 6.0 5.0 - 8.0   Protein, UA Negative Negative   Urobilinogen, UA 0.2 0.2 or 1.0 E.U./dL   Nitrite, UA Negative    Leukocytes, UA Negative Negative   Appearance     Odor    Urine Culture     Status: None   Collection Time: 11/09/23  2:56 PM   Specimen: Urine   UR  Result Value Ref Range   Urine Culture, Routine Final report    Organism ID, Bacteria Comment     Comment: Mixed urogenital flora 10,000-25,000 colony forming units per mL   POCT Urinalysis Dipstick  Status: None   Collection Time: 12/09/23  2:41 PM  Result Value Ref Range   Color, UA yellow    Clarity, UA clear    Glucose, UA Negative Negative   Bilirubin, UA neg    Ketones, UA neg    Spec Grav, UA 1.010 1.010 - 1.025   Blood, UA mod    pH, UA 6.0 5.0 - 8.0   Protein, UA Negative Negative   Urobilinogen, UA 0.2 0.2 or 1.0 E.U./dL   Nitrite, UA neg    Leukocytes, UA Negative Negative   Appearance     Odor    Urine Culture     Status: None   Collection Time: 12/09/23  3:25 PM   Specimen: Urine   UR  Result Value Ref Range   Urine Culture, Routine Final report    Organism ID, Bacteria Comment     Comment: Mixed urogenital flora 10,000-25,000  colony forming units per mL   POCT Urinalysis Dipstick ([id number])     Status: Abnormal   Collection Time: 12/15/23  1:57 PM  Result Value Ref Range   Color, UA     Clarity, UA     Glucose, UA Negative Negative   Bilirubin, UA Negative    Ketones, UA Negative    Spec Grav, UA 1.010 1.010 - 1.025   Blood, UA 1+    pH, UA 6.0 5.0 - 8.0   Protein, UA Negative Negative   Urobilinogen, UA 0.2 0.2 or 1.0 E.U./dL   Nitrite, UA Negative    Leukocytes, UA Negative Negative   Appearance     Odor    POCT Urinalysis Dipstick ([id number])     Status: Normal   Collection Time: 12/28/23  1:42 PM  Result Value Ref Range   Color, UA clear    Clarity, UA clear    Glucose, UA Negative Negative   Bilirubin, UA negative    Ketones, UA negative    Spec Grav, UA 1.010 1.010 - 1.025   Blood, UA 1    pH, UA 6.0 5.0 - 8.0   Protein, UA Negative Negative   Urobilinogen, UA 0.2 0.2 or 1.0 E.U./dL   Nitrite, UA negative    Leukocytes, UA Negative Negative   Appearance clear    Odor none       Assessment & Plan:  Urine culture Abdominal ultrasound If pain worsens, go to the ED.   Problem List Items Addressed This Visit       Other   Abnormal urine - Primary   Relevant Orders   POCT Urinalysis Dipstick ([id number]) (Completed)   Urine Culture   Right lower quadrant abdominal pain   Relevant Orders   US  Abdomen Complete    Return if symptoms worsen or fail to improve, for as scheduled.   Total time spent: 25 minutes  [employer], NP  12/28/2023   This document may have been prepared by Dragon Voice Recognition software and as such may include unintentional dictation errors.

## 2023-12-30 ENCOUNTER — Telehealth: Payer: Self-pay | Admitting: Cardiology

## 2023-12-30 LAB — URINE CULTURE

## 2023-12-30 NOTE — Telephone Encounter (Signed)
 Patient left VM wanting to know if the U/S will show a kidney stone? She feels like she may have a kidney stone. Please advise.   She doesn't understand how her urine culture came back normal when she says she tested positive for a UTI. When I asked when and where she tested positive for a UTI she wouldn't answer me.Aaron Aas

## 2023-12-30 NOTE — Progress Notes (Signed)
 Patient notified

## 2023-12-31 ENCOUNTER — Other Ambulatory Visit

## 2024-01-01 ENCOUNTER — Other Ambulatory Visit: Payer: Self-pay | Admitting: Cardiology

## 2024-01-01 ENCOUNTER — Telehealth: Payer: Self-pay

## 2024-01-01 DIAGNOSIS — F5104 Psychophysiologic insomnia: Secondary | ICD-10-CM

## 2024-01-01 DIAGNOSIS — F410 Panic disorder [episodic paroxysmal anxiety] without agoraphobia: Secondary | ICD-10-CM

## 2024-01-01 DIAGNOSIS — F902 Attention-deficit hyperactivity disorder, combined type: Secondary | ICD-10-CM

## 2024-01-01 DIAGNOSIS — F331 Major depressive disorder, recurrent, moderate: Secondary | ICD-10-CM

## 2024-01-01 DIAGNOSIS — F4310 Post-traumatic stress disorder, unspecified: Secondary | ICD-10-CM

## 2024-01-01 NOTE — Telephone Encounter (Signed)
 Patient called asking for referral for psych dr , she states that the (Dr Juel Nutley) he has left [facility] she would like to see [facility] at [facility], she doesn't want to see [facility] [phone] phone number to clinic per patient

## 2024-01-05 ENCOUNTER — Telehealth: Admitting: Obstetrics and Gynecology

## 2024-01-05 ENCOUNTER — Ambulatory Visit
Admission: RE | Admit: 2024-01-05 | Discharge: 2024-01-05 | Disposition: A | Discharge status: Home / Self Care | Source: Ambulatory Visit | Attending: Cardiology

## 2024-01-05 DIAGNOSIS — R1031 Right lower quadrant pain: Secondary | ICD-10-CM

## 2024-01-12 ENCOUNTER — Ambulatory Visit (INDEPENDENT_AMBULATORY_CARE_PROVIDER_SITE_OTHER): Admitting: Psychiatry

## 2024-01-12 ENCOUNTER — Encounter: Payer: Self-pay | Admitting: Psychiatry

## 2024-01-12 VITALS — BP 122/78 | HR 78 | Temp 98.3°F | Ht 65.0 in | Wt 177.6 lb

## 2024-01-12 DIAGNOSIS — F1320 Sedative, hypnotic or anxiolytic dependence, uncomplicated: Secondary | ICD-10-CM

## 2024-01-12 NOTE — Progress Notes (Addendum)
 Psychiatric Initial Adult Assessment   Patient Identification: Shirley [name] MRN:  [id number] Date of Evaluation:  01/12/2024 Referral Source: Alica Antu NP Chief Complaint:   Chief Complaint  Patient presents with   Establish Care   Visit Diagnosis:    ICD-10-CM   1. Benzodiazepine dependence (HCC)  F13.20       History of Present Illness:  53 year old females presents for establishing care.  Pt presents with the concenr of using Xanax  1mg  TID for anxiety. Pt reports that she is unable to find a provider to be able to fill the prescription reporting her last psychiatrist Disappeared after managing her on Xanax  for 20 years according to her. PDMP review completed.  Patient is observed with picking up Adderall 20 mg recently on 6/7 in which she states she does not know which prescribe her prescribed medications.  Patient was last pick above Xanax  was on 6-25 and patient reports that she would like to consider potential ways to taper off the medications.  Patient was educated show declined be taking care of here at [facility] for psychiatric needs patient will be required to taper off the medications with Klonopin and which will be shared with the case with my supervising Dr. Carlos Chesterfield.  Patient was also advised that she will not be prescribed Adderall while she is under taper benzos.  Patient is unsure if she wants to continue and states that she would like to print talk to her previous prescriber before making a decision.  Patient did state she would like to do a follow-up appointment in which she was scheduled for 2 weeks. Based on this assessment interview is recommended for the patient to continue medications with understanding that patient will be tapered off the medication and switch to Klonopin should she decide to be under care with this practice.  Patient reports understanding and states that she would like to speak with her previous prescriber before making a decision and states she will  follow up in 2 weeks.  Patient was educated on the options for benzodiazepine tapering as well as benzodiazepine withdrawals and what signs and symptoms she is to look for.  Patient also been educated to notify and contact the clinic with through my chart or by calling the clinic should she make a decision.  Patient with no other questions or concerns at this time patient will follow up in 2 weeks.  Patient denies SI, HI, AVH.  Associated Signs/Symptoms: Depression Symptoms:  anxiety, panic attacks, (Hypo) Manic Symptoms:  Negative Anxiety Symptoms:  Excessive Worry, Panic Symptoms, Psychotic Symptoms:  Negative PTSD Symptoms: Negative  Past Psychiatric History:  Previous Psych Hospitalizations:  Outpatient treatment:   Medications Current:  Next Steps:  Medication Trials:  Suicide & Violence:  Substance Use:  Psychotherapy:  Legal:    Previous Psychotropic Medications: No   Substance Abuse History in the last 12 months:  No.  Consequences of Substance Abuse: Negative  Past Medical History:  Past Medical History:  Diagnosis Date   Abnormal Pap smear of cervix    ADHD (attention deficit hyperactivity disorder)    Anxiety    Frequent headaches    Hypertension    Mitral valve prolapse    Ovarian cyst    Spina bifida occulta    UTI (urinary tract infection)     Past Surgical History:  Procedure Laterality Date   LEEP      Family Psychiatric History: No additional  Family History:  Family History  Problem  Relation Age of Onset   Ovarian cancer Mother    Pancreatic cancer Mother    Diabetes Mellitus II Mother    Stroke Father    Heart disease Father    Cancer Father    Diabetes Mellitus II Father    Coronary artery disease Father    Mental illness Father    Anxiety disorder Sister    Mitral valve prolapse Sister    Heart attack Sister    Drug abuse Daughter     Social History:   Social History   Socioeconomic History   Marital status: Divorced     Spouse name: Not on file   Number of children: 2   Years of education: Not on file   Highest education level: Associate degree: occupational, Scientist, product/process development, or vocational program  Occupational History   Not on file  Tobacco Use   Smoking status: Never    Passive exposure: Never   Smokeless tobacco: Never  Vaping Use   Vaping status: Never Used  Substance and Sexual Activity   Alcohol use: Not Currently   Drug use: Never   Sexual activity: Yes    Partners: Male    Birth control/protection: Post-menopausal  Other Topics Concern   Not on file  Social History Narrative   Not on file   Social Drivers of Health   Financial Resource Strain: Medium Risk (03/27/2021)   [facility] from [facility]   Overall Financial Resource Strain (CARDIA)    Difficulty of Paying Living Expenses: Somewhat hard  Food Insecurity: Not on file  Transportation Needs: Unmet Transportation Needs (03/27/2021)   [facility]   PRAPARE - Transportation    Lack of Transportation (Medical): Yes    Lack of Transportation (Non-Medical): Yes  Physical Activity: Not on file  Stress: Not on file  Social Connections: Not on file    Additional Social History: No additional  Allergies:   Allergies  Allergen Reactions   Bactrim [Sulfamethoxazole-Trimethoprim] Anaphylaxis   Gadolinium Anaphylaxis   Gadolinium Derivatives Anaphylaxis   Iodinated Contrast Media Anaphylaxis   Iodine Anaphylaxis   Other Nausea And Vomiting, Rash and Hives    Uncoded Allergy. Allergen: BEZONZTATE Uncoded Allergy. Allergen: decongestants   Sulfa Antibiotics Anaphylaxis and Other (See Comments)    Uncoded Allergy. Allergen: decongestants Uncoded Allergy. Allergen: decongestants    Azithromycin Hives   Clarithromycin Other (See Comments)   Furosemide Other (See Comments)   Guaifenesin Nausea And Vomiting   Macrobid [Nitrofurantoin Monohyd Macro] Swelling    Tongue swelling, chest tightness    Nitrofurantoin  Hives   Penicillins Hives   Allegra [Fexofenadine] Palpitations    Heart Racing    Amoxicillin Rash   Doxycycline Rash and Other (See Comments)   Keflex [Cephalexin] Rash and Other (See Comments)   Levofloxacin Rash    Metabolic Disorder Labs: Lab Results  Component Value Date   HGBA1C 5.5 04/17/2022   MPG 111 04/17/2022   No results found for: PROLACTIN Lab Results  Component Value Date   CHOL 171 04/17/2022   TRIG 145 04/17/2022   HDL 56 04/17/2022   CHOLHDL 3.1 04/17/2022   LDLCALC 90 04/17/2022   Lab Results  Component Value Date   TSH 1.909 01/24/2023    Therapeutic Level Labs: No results found for: LITHIUM No results found for: CBMZ No results found for: VALPROATE  Current Medications: Current Outpatient Medications  Medication Sig Dispense Refill   albuterol  (VENTOLIN  HFA) 108 (90 Base) MCG/ACT inhaler Inhale 1-2 puffs  into the lungs every 4 (four) hours as needed for wheezing or shortness of breath. 6.7 each 5   ALPRAZolam  (XANAX ) 1 MG tablet Take 1 mg by mouth 3 (three) times daily as needed for anxiety. 1 mg three times a day and 2 mg at bedtime     amitriptyline  (ELAVIL ) 50 MG tablet TAKE 1 TABLET BY MOUTH EVERYDAY AT BEDTIME 90 tablet 1   amphetamine-dextroamphetamine (ADDERALL XR) 20 MG 24 hr capsule Take 20 mg by mouth every morning.     budesonide -formoterol  (SYMBICORT ) 80-4.5 MCG/ACT inhaler Inhale 2 puffs into the lungs 2 (two) times daily. 30.6 g 12   cetirizine  (ZYRTEC ) 10 MG tablet TAKE 1 TABLET BY MOUTH EVERY DAY 90 tablet 0   Cholecalciferol (VITAMIN D3) 125 MCG (5000 UT) CAPS Take by mouth.     fluticasone  (FLONASE ) 50 MCG/ACT nasal spray SPRAY 2 SPRAYS INTO EACH NOSTRIL EVERY DAY 48 mL 1   triamcinolone  cream (KENALOG ) 0.5 % Apply 1 Application topically 3 (three) times daily. 30 g 0   valsartan  (DIOVAN ) 80 MG tablet Take 1 tablet (80 mg total) by mouth daily. 30 tablet 11   conjugated estrogens  (PREMARIN ) vaginal cream Estrogen Cream  Instruction Discard applicator Apply pea sized amount to tip of finger to urethra before bed. Wash hands well after application. Use Monday, Wednesday and Friday 42.5 g 12   DENTAGEL 1.1 % GEL dental gel Place onto teeth daily.     No current facility-administered medications for this visit.    Musculoskeletal: Strength & Muscle Tone: within normal limits Gait & Station: normal Patient leans: N/A  Psychiatric Specialty Exam: Review of Systems  Constitutional: Negative.   HENT: Negative.    Eyes: Negative.   Respiratory: Negative.    Cardiovascular: Negative.   Gastrointestinal: Negative.   Endocrine: Negative.   Genitourinary: Negative.   Musculoskeletal: Negative.   Skin: Negative.   Allergic/Immunologic: Negative.   Neurological: Negative.   Hematological: Negative.   Psychiatric/Behavioral:  Positive for dysphoric mood. The patient is nervous/anxious.     Blood pressure 122/78, pulse 78, temperature 98.3 F (36.8 C), temperature source Temporal, height 5' 5 (1.651 m), weight 177 lb 9.6 oz (80.6 kg), SpO2 100%.Body mass index is 29.55 kg/m.  General Appearance: Well Groomed  Eye Contact:  Good  Speech:  Clear and Coherent  Volume:  Normal  Mood:  Anxious and Depressed  Affect:  Depressed  Thought Process:  Coherent  Orientation:  Full (Time, Place, and Person)  Thought Content:  WDL  Suicidal Thoughts:  No  Homicidal Thoughts:  No  Memory:  Immediate;   Good Recent;   Good Remote;   Good  Judgement:  Good  Insight:  Good  Psychomotor Activity:  Normal  Concentration:  Concentration: Good and Attention Span: Good  Recall:  Good  Fund of Knowledge:Good  Language: Good  Akathisia:  No  Handed:  Right  AIMS (if indicated):    Assets:  Desire for Improvement Financial Resources/Insurance Housing  ADL's:  Intact  Cognition: WNL  Sleep:  Good   Screenings: GAD-7    Flowsheet Row Office Visit from 05/07/2023 in [facility] Office Visit from  02/03/2023 in [facility] Office Visit from 10/14/2022 in [facility] Video Visit from 07/29/2022 in [facility] Office Visit from 10/02/2021 in [facility]  Total GAD-7 Score 18 7 21 9 4    [phone]  Flowsheet Row Office Visit from 05/07/2023 in [facility] Office Visit from 02/03/2023 in [facility] Office Visit from 12/04/2022 in [facility] Office Visit from 10/14/2022 in [facility] Video Visit from 07/29/2022 in [facility] [place]  PHQ-2 Total Score 3 2 5 6 1   PHQ-9 Total Score 21 8 20 21 10    Flowsheet Row ED from 02/25/2023 in [facility] Emergency Department at [facility] ED from 01/24/2023 in [facility] Emergency Department at [facility] ED from 09/27/2022 in [facility] Emergency Department at [facility]  C-SSRS RISK CATEGORY No Risk No Risk No Risk    Assessment and Plan:  Assessment - Diagnosis: Benzodiazepine dependence (HCC) [F13.20]   - Progress: Baseline appointment - Risk Factors: Worsening symptoms, seizure risk, withdrawal symptoms of benzodiazepine   Plan - Medications:  Continue taking Xanax  1mg  TID for anxiety, prescribed by another psychiatrist, and follow up with [facility] if you would like to taper off of benzos.  - Psychotherapy: Currently working with a therapist. - Education: PT educated on how to reach this provider with MyChart and calling the clinic.  Pt educated on medication, purpose, side effects and adverse reactions.  - Follow-Up: Pt to follow up in 2 weeks.  - Referrals: No referrals - Safety Planning:  The patient has been educated, if they should have suicidal thoughts with or without a plan to call 911, or go to the closest emergency department.  Pt verbalized understanding.  Pt denies firearms within the home.  Pt also  agrees to call the clinic should they have worsening symptoms before the next appointment.    Patient/Guardian was advised Release of Information must be obtained prior to any record release in order to collaborate their care with an outside provider. Patient/Guardian was advised if they have not already done so to contact the registration department to sign all necessary forms in order for us  to release information regarding their care.   Consent: Patient/Guardian gives verbal consent for treatment and assignment of benefits for services provided during this visit. Patient/Guardian expressed understanding and agreed to proceed.   Arlana Labor, NP 6/17/20253:39 PM

## 2024-01-14 ENCOUNTER — Ambulatory Visit: Admitting: Cardiology

## 2024-01-14 ENCOUNTER — Encounter: Payer: Self-pay | Admitting: Cardiology

## 2024-01-14 VITALS — BP 126/78 | HR 91 | Ht 65.0 in | Wt 177.0 lb

## 2024-01-14 DIAGNOSIS — Z0130 Encounter for examination of blood pressure without abnormal findings: Secondary | ICD-10-CM

## 2024-01-14 DIAGNOSIS — W57XXXA Bitten or stung by nonvenomous insect and other nonvenomous arthropods, initial encounter: Secondary | ICD-10-CM | POA: Diagnosis not present

## 2024-01-14 DIAGNOSIS — L539 Erythematous condition, unspecified: Secondary | ICD-10-CM

## 2024-01-14 NOTE — Progress Notes (Signed)
 Established Patient Office Visit  Subjective:  Patient ID: Shirley [name], female    DOB: Sep 21, 1970  Age: 53 y.o. MRN: [id number]  Chief Complaint  Patient presents with   Acute Visit    Possible Spider Bite back side of Right Leg    Patient in office for an acute visit, insect bite. Patient reports she was sitting out side when she felt a bug bite her on the back of her right leg. On exam, two visible bite marks, erythema, tenderness. Patient has left over antibiotic from dental surgery, clindamycin . Recommend taking clindamycin  daily for 7 days. Call office with an update in one week.     No other concerns at this time.   Past Medical History:  Diagnosis Date   Abnormal Pap smear of cervix    ADHD (attention deficit hyperactivity disorder)    Anxiety    Frequent headaches    Hypertension    Mitral valve prolapse    Ovarian cyst    Spina bifida occulta    UTI (urinary tract infection)     Past Surgical History:  Procedure Laterality Date   LEEP      Social History   Socioeconomic History   Marital status: Divorced    Spouse name: Not on file   Number of children: 2   Years of education: Not on file   Highest education level: Associate degree: occupational, Scientist, product/process development, or vocational program  Occupational History   Not on file  Tobacco Use   Smoking status: Never    Passive exposure: Never   Smokeless tobacco: Never  Vaping Use   Vaping status: Never Used  Substance and Sexual Activity   Alcohol use: Not Currently   Drug use: Never   Sexual activity: Yes    Partners: Male    Birth control/protection: Post-menopausal  Other Topics Concern   Not on file  Social History Narrative   Not on file   Social Drivers of Health   Financial Resource Strain: Medium Risk (03/27/2021)   Received from [facility]   Overall Financial Resource Strain (CARDIA)    Difficulty of Paying Living Expenses: Somewhat hard  Food Insecurity: Not on file  Transportation  Needs: Unmet Transportation Needs (03/27/2021)   Received from [facility]   PRAPARE - Transportation    Lack of Transportation (Medical): Yes    Lack of Transportation (Non-Medical): Yes  Physical Activity: Not on file  Stress: Not on file  Social Connections: Not on file  Intimate Partner Violence: Not on file    Family History  Problem Relation Age of Onset   Ovarian cancer Mother    Pancreatic cancer Mother    Diabetes Mellitus II Mother    Stroke Father    Heart disease Father    Cancer Father    Diabetes Mellitus II Father    Coronary artery disease Father    Mental illness Father    Anxiety disorder Sister    Mitral valve prolapse Sister    Heart attack Sister    Drug abuse Daughter     Allergies  Allergen Reactions   Bactrim [Sulfamethoxazole-Trimethoprim] Anaphylaxis   Gadolinium Anaphylaxis   Gadolinium Derivatives Anaphylaxis   Iodinated Contrast Media Anaphylaxis   Iodine Anaphylaxis   Other Nausea And Vomiting, Rash and Hives    Uncoded Allergy. Allergen: BEZONZTATE Uncoded Allergy. Allergen: decongestants   Sulfa Antibiotics Anaphylaxis and Other (See Comments)    Uncoded Allergy. Allergen: decongestants Uncoded Allergy. Allergen: decongestants  Azithromycin Hives   Clarithromycin Other (See Comments)   Furosemide Other (See Comments)   Guaifenesin Nausea And Vomiting   Macrobid [Nitrofurantoin Monohyd Macro] Swelling    Tongue swelling, chest tightness    Nitrofurantoin Hives   Penicillins Hives   Allegra [Fexofenadine] Palpitations    Heart Racing    Amoxicillin Rash   Doxycycline Rash and Other (See Comments)   Keflex [Cephalexin] Rash and Other (See Comments)   Levofloxacin Rash    Outpatient Medications Prior to Visit  Medication Sig   albuterol  (VENTOLIN  HFA) 108 (90 Base) MCG/ACT inhaler Inhale 1-2 puffs into the lungs every 4 (four) hours as needed for wheezing or shortness of breath.   ALPRAZolam  (XANAX ) 1 MG tablet Take 1 mg by  mouth 3 (three) times daily as needed for anxiety. 1 mg three times a day and 2 mg at bedtime   amitriptyline  (ELAVIL ) 50 MG tablet TAKE 1 TABLET BY MOUTH EVERYDAY AT BEDTIME   amphetamine-dextroamphetamine (ADDERALL XR) 20 MG 24 hr capsule Take 20 mg by mouth every morning.   budesonide -formoterol  (SYMBICORT ) 80-4.5 MCG/ACT inhaler Inhale 2 puffs into the lungs 2 (two) times daily.   cetirizine  (ZYRTEC ) 10 MG tablet TAKE 1 TABLET BY MOUTH EVERY DAY   Cholecalciferol (VITAMIN D3) 125 MCG (5000 UT) CAPS Take by mouth.   fluticasone  (FLONASE ) 50 MCG/ACT nasal spray SPRAY 2 SPRAYS INTO EACH NOSTRIL EVERY DAY   triamcinolone  cream (KENALOG ) 0.5 % Apply 1 Application topically 3 (three) times daily.   valsartan  (DIOVAN ) 80 MG tablet Take 1 tablet (80 mg total) by mouth daily.   [DISCONTINUED] conjugated estrogens  (PREMARIN ) vaginal cream Estrogen Cream Instruction Discard applicator Apply pea sized amount to tip of finger to urethra before bed. Wash hands well after application. Use Monday, Wednesday and Friday   [DISCONTINUED] DENTAGEL 1.1 % GEL dental gel Place onto teeth daily.   No facility-administered medications prior to visit.    Review of Systems  Constitutional: Negative.   HENT: Negative.    Eyes: Negative.   Respiratory: Negative.  Negative for shortness of breath.   Cardiovascular: Negative.  Negative for chest pain.  Gastrointestinal: Negative.  Negative for abdominal pain, constipation and diarrhea.  Genitourinary: Negative.   Musculoskeletal:  Negative for joint pain and myalgias.  Skin:  Positive for itching.  Neurological: Negative.  Negative for dizziness and headaches.  Endo/Heme/Allergies: Negative.   All other systems reviewed and are negative.      Objective:   BP 126/78   Pulse 91   Ht 5' 5 (1.651 m)   Wt 177 lb (80.3 kg)   LMP  (LMP Unknown) Comment: last cycle over a year ago  SpO2 98%   BMI 29.45 kg/m   Vitals:   01/14/24 0915  BP: 126/78  Pulse:  91  Height: 5' 5 (1.651 m)  Weight: 177 lb (80.3 kg)  SpO2: 98%  BMI (Calculated): 29.45    Physical Exam Vitals and nursing note reviewed.  Constitutional:      Appearance: Normal appearance. She is normal weight.   HENT:     Head: Normocephalic and atraumatic.     Nose: Nose normal.     Mouth/Throat:     Mouth: Mucous membranes are moist.   Eyes:     Extraocular Movements: Extraocular movements intact.     Conjunctiva/sclera: Conjunctivae normal.     Pupils: Pupils are equal, round, and reactive to light.    Cardiovascular:     Rate and Rhythm: Normal rate  and regular rhythm.     Pulses: Normal pulses.     Heart sounds: Normal heart sounds.  Pulmonary:     Effort: Pulmonary effort is normal.     Breath sounds: Normal breath sounds.  Abdominal:     General: Abdomen is flat. Bowel sounds are normal.     Palpations: Abdomen is soft.   Musculoskeletal:        General: Normal range of motion.     Cervical back: Normal range of motion.   Skin:    General: Skin is warm and dry.   Neurological:     General: No focal deficit present.     Mental Status: She is alert and oriented to person, place, and time.   Psychiatric:        Mood and Affect: Mood normal.        Behavior: Behavior normal.        Thought Content: Thought content normal.        Judgment: Judgment normal.      No results found for any visits on 01/14/24.  Recent Results (from the past 2160 hours)  POCT Urinalysis Dipstick     Status: Abnormal   Collection Time: 10/28/23  1:25 PM  Result Value Ref Range   Color, UA     Clarity, UA     Glucose, UA Negative Negative   Bilirubin, UA Negative    Ketones, UA Negative    Spec Grav, UA <=1.005 (A) 1.010 - 1.025   Blood, UA Positive    pH, UA 6.0 5.0 - 8.0   Protein, UA Negative Negative   Urobilinogen, UA 0.2 0.2 or 1.0 E.U./dL   Nitrite, UA Negative    Leukocytes, UA Negative Negative   Appearance     Odor    POCT Urinalysis Dipstick  ([id number])     Status: Abnormal   Collection Time: 11/09/23  2:50 PM  Result Value Ref Range   Color, UA     Clarity, UA     Glucose, UA Negative Negative   Bilirubin, UA Negative    Ketones, UA Negative    Spec Grav, UA 1.010 1.010 - 1.025   Blood, UA +    pH, UA 6.0 5.0 - 8.0   Protein, UA Negative Negative   Urobilinogen, UA 0.2 0.2 or 1.0 E.U./dL   Nitrite, UA Negative    Leukocytes, UA Negative Negative   Appearance     Odor    Urine Culture     Status: None   Collection Time: 11/09/23  2:56 PM   Specimen: Urine   UR  Result Value Ref Range   Urine Culture, Routine Final report    Organism ID, Bacteria Comment     Comment: Mixed urogenital flora 10,000-25,000 colony forming units per mL   POCT Urinalysis Dipstick     Status: None   Collection Time: 12/09/23  2:41 PM  Result Value Ref Range   Color, UA yellow    Clarity, UA clear    Glucose, UA Negative Negative   Bilirubin, UA neg    Ketones, UA neg    Spec Grav, UA 1.010 1.010 - 1.025   Blood, UA mod    pH, UA 6.0 5.0 - 8.0   Protein, UA Negative Negative   Urobilinogen, UA 0.2 0.2 or 1.0 E.U./dL   Nitrite, UA neg    Leukocytes, UA Negative Negative   Appearance     Odor    Urine Culture  Status: None   Collection Time: 12/09/23  3:25 PM   Specimen: Urine   UR  Result Value Ref Range   Urine Culture, Routine Final report    Organism ID, Bacteria Comment     Comment: Mixed urogenital flora 10,000-25,000 colony forming units per mL   POCT Urinalysis Dipstick ([id number])     Status: Abnormal   Collection Time: 12/15/23  1:57 PM  Result Value Ref Range   Color, UA     Clarity, UA     Glucose, UA Negative Negative   Bilirubin, UA Negative    Ketones, UA Negative    Spec Grav, UA 1.010 1.010 - 1.025   Blood, UA 1+    pH, UA 6.0 5.0 - 8.0   Protein, UA Negative Negative   Urobilinogen, UA 0.2 0.2 or 1.0 E.U./dL   Nitrite, UA Negative    Leukocytes, UA Negative Negative   Appearance     Odor    POCT  Urinalysis Dipstick ([id number])     Status: Normal   Collection Time: 12/28/23  1:42 PM  Result Value Ref Range   Color, UA clear    Clarity, UA clear    Glucose, UA Negative Negative   Bilirubin, UA negative    Ketones, UA negative    Spec Grav, UA 1.010 1.010 - 1.025   Blood, UA 1    pH, UA 6.0 5.0 - 8.0   Protein, UA Negative Negative   Urobilinogen, UA 0.2 0.2 or 1.0 E.U./dL   Nitrite, UA negative    Leukocytes, UA Negative Negative   Appearance clear    Odor none   Urine Culture     Status: None   Collection Time: 12/28/23  2:19 PM   Specimen: Urine   UR  Result Value Ref Range   Urine Culture, Routine Final report    Organism ID, Bacteria Comment     Comment: Mixed urogenital flora Less than 10,000 colonies/mL       Assessment & Plan:  Clindamycin  daily for 7 days  Problem List Items Addressed This Visit       Other   Bug bite - Primary    Return if symptoms worsen or fail to improve.   Total time spent: 25 minutes  [employer], NP  01/14/2024   This document may have been prepared by Dragon Voice Recognition software and as such may include unintentional dictation errors.

## 2024-01-15 ENCOUNTER — Telehealth: Payer: Self-pay | Admitting: Cardiology

## 2024-01-15 ENCOUNTER — Other Ambulatory Visit: Payer: Self-pay | Admitting: Cardiology

## 2024-01-15 DIAGNOSIS — W57XXXA Bitten or stung by nonvenomous insect and other nonvenomous arthropods, initial encounter: Secondary | ICD-10-CM

## 2024-01-15 MED ORDER — TRIAMCINOLONE ACETONIDE 0.5 % EX CREA: 1.0000 | TOPICAL_CREAM | Freq: Three times a day (TID) | CUTANEOUS | 0 refills | Status: DC

## 2024-01-15 NOTE — Telephone Encounter (Signed)
 Pt called in regards to her spider bite & stated she wanted to come in again and be seen. I stated to the pt that she needs to let the antibiotic get in her system before she comes back next week. She stated that she wanted a steroid cream to put on it to stop the burning and urgency to itch  Please advise

## 2024-01-19 ENCOUNTER — Ambulatory Visit: Admitting: Cardiology

## 2024-01-19 ENCOUNTER — Encounter: Payer: Self-pay | Admitting: Cardiology

## 2024-01-19 VITALS — BP 120/70 | HR 92 | Ht 65.0 in | Wt 176.8 lb

## 2024-01-19 DIAGNOSIS — Z0130 Encounter for examination of blood pressure without abnormal findings: Secondary | ICD-10-CM

## 2024-01-19 DIAGNOSIS — W57XXXD Bitten or stung by nonvenomous insect and other nonvenomous arthropods, subsequent encounter: Secondary | ICD-10-CM | POA: Diagnosis not present

## 2024-01-19 DIAGNOSIS — L539 Erythematous condition, unspecified: Secondary | ICD-10-CM

## 2024-01-19 NOTE — Progress Notes (Signed)
 Established Patient Office Visit  Subjective:  Patient ID: Shirley [name], female    DOB: [birth year]  Age: 53 y.o. MRN: [id number]  Chief Complaint  Patient presents with   Follow-up    Spider bite hasn't gotten any better.    Patient in office for an acute visit, complaining of spider bite that is not getting better. Patient has been taking Clindamycin  twice daily, using hydrocortisone cream on the bug bites.  Patient presents today for follow up on bug bites. Bites appear to be improving, continue to be erythematous, no drainage noted. Continue using hydrocortisone cream on area.     No other concerns at this time.   Past Medical History:  Diagnosis Date   Abnormal Pap smear of cervix    ADHD (attention deficit hyperactivity disorder)    Anxiety    Frequent headaches    Hypertension    Mitral valve prolapse    Ovarian cyst    Spina bifida occulta    UTI (urinary tract infection)     Past Surgical History:  Procedure Laterality Date   LEEP      Social History   Socioeconomic History   Marital status: Divorced    Spouse name: Not on file   Number of children: 2   Years of education: Not on file   Highest education level: Associate degree: occupational, Scientist, product/process development, or vocational program  Occupational History   Not on file  Tobacco Use   Smoking status: Never    Passive exposure: Never   Smokeless tobacco: Never  Vaping Use   Vaping status: Never Used  Substance and Sexual Activity   Alcohol use: Not Currently   Drug use: Never   Sexual activity: Yes    Partners: Male    Birth control/protection: Post-menopausal  Other Topics Concern   Not on file  Social History Narrative   Not on file   Social Drivers of Health   Financial Resource Strain: Medium Risk (03/27/2021)   Received from [facility]   Overall Financial Resource Strain (CARDIA)    Difficulty of Paying Living Expenses: Somewhat hard  Food Insecurity: Not on file  Transportation Needs:  Unmet Transportation Needs (03/27/2021)   Received from [facility]   PRAPARE - Transportation    Lack of Transportation (Medical): Yes    Lack of Transportation (Non-Medical): Yes  Physical Activity: Not on file  Stress: Not on file  Social Connections: Not on file  Intimate Partner Violence: Not on file    Family History  Problem Relation Age of Onset   Ovarian cancer Mother    Pancreatic cancer Mother    Diabetes Mellitus II Mother    Stroke Father    Heart disease Father    Cancer Father    Diabetes Mellitus II Father    Coronary artery disease Father    Mental illness Father    Anxiety disorder Sister    Mitral valve prolapse Sister    Heart attack Sister    Drug abuse Daughter     Allergies  Allergen Reactions   Bactrim [Sulfamethoxazole-Trimethoprim] Anaphylaxis   Gadolinium Anaphylaxis   Gadolinium Derivatives Anaphylaxis   Iodinated Contrast Media Anaphylaxis   Iodine Anaphylaxis   Other Nausea And Vomiting, Rash and Hives    Uncoded Allergy. Allergen: BEZONZTATE Uncoded Allergy. Allergen: decongestants   Sulfa Antibiotics Anaphylaxis and Other (See Comments)    Uncoded Allergy. Allergen: decongestants Uncoded Allergy. Allergen: decongestants    Azithromycin Hives   Clarithromycin Other (  See Comments)   Furosemide Other (See Comments)   Guaifenesin Nausea And Vomiting   Macrobid [Nitrofurantoin Monohyd Macro] Swelling    Tongue swelling, chest tightness    Nitrofurantoin Hives   Penicillins Hives   Allegra [Fexofenadine] Palpitations    Heart Racing    Amoxicillin Rash   Doxycycline Rash and Other (See Comments)   Keflex [Cephalexin] Rash and Other (See Comments)   Levofloxacin Rash    Outpatient Medications Prior to Visit  Medication Sig   albuterol  (VENTOLIN  HFA) 108 (90 Base) MCG/ACT inhaler Inhale 1-2 puffs into the lungs every 4 (four) hours as needed for wheezing or shortness of breath.   ALPRAZolam  (XANAX ) 1 MG tablet Take 1 mg by mouth  3 (three) times daily as needed for anxiety. 1 mg three times a day and 2 mg at bedtime   amitriptyline  (ELAVIL ) 50 MG tablet TAKE 1 TABLET BY MOUTH EVERYDAY AT BEDTIME   amphetamine-dextroamphetamine (ADDERALL XR) 20 MG 24 hr capsule Take 20 mg by mouth every morning.   budesonide -formoterol  (SYMBICORT ) 80-4.5 MCG/ACT inhaler Inhale 2 puffs into the lungs 2 (two) times daily.   cetirizine  (ZYRTEC ) 10 MG tablet TAKE 1 TABLET BY MOUTH EVERY DAY   Cholecalciferol (VITAMIN D3) 125 MCG (5000 UT) CAPS Take by mouth.   fluticasone  (FLONASE ) 50 MCG/ACT nasal spray SPRAY 2 SPRAYS INTO EACH NOSTRIL EVERY DAY   triamcinolone  cream (KENALOG ) 0.5 % Apply 1 Application topically 3 (three) times daily.   valsartan  (DIOVAN ) 80 MG tablet Take 1 tablet (80 mg total) by mouth daily.   No facility-administered medications prior to visit.    Review of Systems  Constitutional: Negative.   HENT: Negative.    Eyes: Negative.   Respiratory: Negative.  Negative for shortness of breath.   Cardiovascular: Negative.  Negative for chest pain.  Gastrointestinal: Negative.  Negative for abdominal pain, constipation and diarrhea.  Genitourinary: Negative.   Musculoskeletal:  Negative for joint pain and myalgias.  Skin: Negative.  Negative for itching.  Neurological: Negative.  Negative for dizziness and headaches.  Endo/Heme/Allergies: Negative.   All other systems reviewed and are negative.      Objective:   BP 120/70   Pulse 92   Ht 5' 5 (1.651 m)   Wt 176 lb 12.8 oz (80.2 kg)   LMP  (LMP Unknown) Comment: last cycle over a year ago  SpO2 96%   BMI 29.42 kg/m   Vitals:   01/19/24 1429  BP: 120/70  Pulse: 92  Height: 5' 5 (1.651 m)  Weight: 176 lb 12.8 oz (80.2 kg)  SpO2: 96%  BMI (Calculated): 29.42    Physical Exam Vitals and nursing note reviewed.  Constitutional:      Appearance: Normal appearance. She is normal weight.   HENT:     Head: Normocephalic and atraumatic.     Nose: Nose  normal.     Mouth/Throat:     Mouth: Mucous membranes are moist.   Eyes:     Extraocular Movements: Extraocular movements intact.     Conjunctiva/sclera: Conjunctivae normal.     Pupils: Pupils are equal, round, and reactive to light.    Cardiovascular:     Rate and Rhythm: Normal rate and regular rhythm.     Pulses: Normal pulses.     Heart sounds: Normal heart sounds.  Pulmonary:     Effort: Pulmonary effort is normal.     Breath sounds: Normal breath sounds.  Abdominal:     General: Abdomen is flat.  Bowel sounds are normal.     Palpations: Abdomen is soft.   Musculoskeletal:        General: Normal range of motion.     Cervical back: Normal range of motion.   Skin:    General: Skin is warm and dry.   Neurological:     General: No focal deficit present.     Mental Status: She is alert and oriented to person, place, and time.   Psychiatric:        Mood and Affect: Mood normal.        Behavior: Behavior normal.        Thought Content: Thought content normal.        Judgment: Judgment normal.      No results found for any visits on 01/19/24.  Recent Results (from the past 2160 hours)  POCT Urinalysis Dipstick     Status: Abnormal   Collection Time: 10/28/23  1:25 PM  Result Value Ref Range   Color, UA     Clarity, UA     Glucose, UA Negative Negative   Bilirubin, UA Negative    Ketones, UA Negative    Spec Grav, UA <=1.005 (A) 1.010 - 1.025   Blood, UA Positive    pH, UA 6.0 5.0 - 8.0   Protein, UA Negative Negative   Urobilinogen, UA 0.2 0.2 or 1.0 E.U./dL   Nitrite, UA Negative    Leukocytes, UA Negative Negative   Appearance     Odor    POCT Urinalysis Dipstick (18997)     Status: Abnormal   Collection Time: 11/09/23  2:50 PM  Result Value Ref Range   Color, UA     Clarity, UA     Glucose, UA Negative Negative   Bilirubin, UA Negative    Ketones, UA Negative    Spec Grav, UA 1.010 1.010 - 1.025   Blood, UA +    pH, UA 6.0 5.0 - 8.0   Protein,  UA Negative Negative   Urobilinogen, UA 0.2 0.2 or 1.0 E.U./dL   Nitrite, UA Negative    Leukocytes, UA Negative Negative   Appearance     Odor    Urine Culture     Status: None   Collection Time: 11/09/23  2:56 PM   Specimen: Urine   UR  Result Value Ref Range   Urine Culture, Routine Final report    Organism ID, Bacteria Comment     Comment: Mixed urogenital flora 10,000-25,000 colony forming units per mL   POCT Urinalysis Dipstick     Status: None   Collection Time: 12/09/23  2:41 PM  Result Value Ref Range   Color, UA yellow    Clarity, UA clear    Glucose, UA Negative Negative   Bilirubin, UA neg    Ketones, UA neg    Spec Grav, UA 1.010 1.010 - 1.025   Blood, UA mod    pH, UA 6.0 5.0 - 8.0   Protein, UA Negative Negative   Urobilinogen, UA 0.2 0.2 or 1.0 E.U./dL   Nitrite, UA neg    Leukocytes, UA Negative Negative   Appearance     Odor    Urine Culture     Status: None   Collection Time: 12/09/23  3:25 PM   Specimen: Urine   UR  Result Value Ref Range   Urine Culture, Routine Final report    Organism ID, Bacteria Comment     Comment: Mixed urogenital flora 10,000-25,000 colony forming units  per mL   POCT Urinalysis Dipstick (18997)     Status: Abnormal   Collection Time: 12/15/23  1:57 PM  Result Value Ref Range   Color, UA     Clarity, UA     Glucose, UA Negative Negative   Bilirubin, UA Negative    Ketones, UA Negative    Spec Grav, UA 1.010 1.010 - 1.025   Blood, UA 1+    pH, UA 6.0 5.0 - 8.0   Protein, UA Negative Negative   Urobilinogen, UA 0.2 0.2 or 1.0 E.U./dL   Nitrite, UA Negative    Leukocytes, UA Negative Negative   Appearance     Odor    POCT Urinalysis Dipstick (18997)     Status: Normal   Collection Time: 12/28/23  1:42 PM  Result Value Ref Range   Color, UA clear    Clarity, UA clear    Glucose, UA Negative Negative   Bilirubin, UA negative    Ketones, UA negative    Spec Grav, UA 1.010 1.010 - 1.025   Blood, UA 1    pH, UA  6.0 5.0 - 8.0   Protein, UA Negative Negative   Urobilinogen, UA 0.2 0.2 or 1.0 E.U./dL   Nitrite, UA negative    Leukocytes, UA Negative Negative   Appearance clear    Odor none   Urine Culture     Status: None   Collection Time: 12/28/23  2:19 PM   Specimen: Urine   UR  Result Value Ref Range   Urine Culture, Routine Final report    Organism ID, Bacteria Comment     Comment: Mixed urogenital flora Less than 10,000 colonies/mL       Assessment & Plan:  Continue using hydrocortisone cream on area.   Problem List Items Addressed This Visit       Other   Bug bite - Primary    Return if symptoms worsen or fail to improve.   Total time spent: 25 minutes  [employer], NP  01/19/2024   This document may have been prepared by Dragon Voice Recognition software and as such may include unintentional dictation errors.

## 2024-01-27 ENCOUNTER — Encounter: Payer: Self-pay | Admitting: Psychiatry

## 2024-01-27 ENCOUNTER — Ambulatory Visit (INDEPENDENT_AMBULATORY_CARE_PROVIDER_SITE_OTHER): Payer: Self-pay | Admitting: Psychiatry

## 2024-01-27 VITALS — BP 118/80 | HR 85 | Temp 98.3°F | Ht 65.0 in | Wt 178.8 lb

## 2024-01-27 DIAGNOSIS — F1320 Sedative, hypnotic or anxiolytic dependence, uncomplicated: Secondary | ICD-10-CM

## 2024-01-28 ENCOUNTER — Telehealth: Payer: Self-pay | Admitting: Psychiatry

## 2024-01-28 NOTE — Telephone Encounter (Signed)
 Pt has been called due to concerns of comments regarding the note.  Pt was called back and clarification has been shared that she had concerns.  Pt was counseled about the recommendation of Klonopin, as recommended by [facility]. Pt has been educated on the plan of care with myself and [facility], in which she does acknowledge that she is concerned as she is still following recommendation from Dr. Daniel.  Pt answer and questions were answered and she is satisfied with the education and explanation of the note.   Pt is satisfied with the note and thanks this provider for his time educating the patient.  30 min was spent with this patient on a telephone call.

## 2024-01-28 NOTE — Progress Notes (Addendum)
 BH MD/PA/NP OP Progress Note  01/28/2024 12:40 PM Shirley [name]  MRN:  [id number]  Chief Complaint:  Chief Complaint  Patient presents with   Follow-up   HPI: Patient follows up with [facility] for follow-up.  Patient reports that she went to go by Dr. Daniel and when she was able to locate this provider and states that he provided 3 refills of the medication for the next 3 months.  Patient has been educated that this patient cannot get additional benzodiazepine and if the patient wants to participate in the benzodiazepine taper she needs to follow up after the prescription has been completed.  Patient reports that she is still on the fence about participating in a taper of benzodiazepine with Klonopin based on [facility] recommendation and states that she is scared of withdrawals.  Due to the patient seeing Dr. Daniel stating patient has been educated that I will not be able to assist this patient if she continues to see another provider Dr. Daniel while receiving care and prescriptions from [facility].  Patient is aware that the this provider has access to PDMP and is able to see all prescriptions picked up. Patient reports that Dr. Daniel states it is not safe to switch from Xanax . Patient states that she is not sure if she wants to participate in taper and states that she will come back after she is completed with her current prescriptions.  Patient has been made aware that once she starts on the taper that she cannot see other any other providers and states that this provider will manage her symptoms and withdrawals with other medications in which she is still reluctant.  Patient states that she will reach out if she is interested.  Patient with no other questions or concerns.  Patient with no follow-up at this time.  Patient denies SI, HI, AVH.  Patient at risk for benzodiazepine withdrawals.  Patient has been educated go to the emergency room or call 911 if he experienced  withdrawals without any benzodiazepine medications.  This is the final note for the patient for this case patient will reach out if she would like to participate in care with [facility].  I personally spent a total of 30 minutes in the care of the patient today including preparing to see the patient, getting/reviewing separately obtained history, performing a medically appropriate exam/evaluation, counseling and educating, and documenting clinical information in the EHR.  Visit Diagnosis:    ICD-10-CM   1. Benzodiazepine dependence (HCC)  F13.20       Past Psychiatric History:  Previous Psych Hospitalizations: - Denies   Outpatient treatment:   -Previously seen by Dr. Daniel, provider moved and did not inform patient  Medications Current: -Xanax  1 mg 3 times a day and once before bed, patient has been encouraged to participate in the mental taper, patient has not committed with this provider yet.  Prescription being provided by Dr. Beverley. Next Steps:  -Patient has been educated that she can only work with 1 provider and 1 she is committed to the mental taper we will have to work on the Bentyl taper and cannot prescribe more than prescribed. Medication Trials:  -Negative Suicide & Violence:  -Denies SI, HI, AVH Substance Use:  -Benzodiazepine use Psychotherapy:  -States participating in weekly therapy Legal:   - Denies Past Medical History:  Past Medical History:  Diagnosis Date   Abnormal Pap smear of cervix    ADHD (attention deficit hyperactivity  disorder)    Anxiety    Frequent headaches    Hypertension    Mitral valve prolapse    Ovarian cyst    Spina bifida occulta    UTI (urinary tract infection)     Past Surgical History:  Procedure Laterality Date   LEEP      Family Psychiatric History: No additional  Family History:  Family History  Problem Relation Age of Onset   Ovarian cancer Mother    Pancreatic cancer Mother    Diabetes  Mellitus II Mother    Stroke Father    Heart disease Father    Cancer Father    Diabetes Mellitus II Father    Coronary artery disease Father    Mental illness Father    Anxiety disorder Sister    Mitral valve prolapse Sister    Heart attack Sister    Drug abuse Daughter     Social History:  Social History   Socioeconomic History   Marital status: Divorced    Spouse name: Not on file   Number of children: 2   Years of education: Not on file   Highest education level: Associate degree: occupational, Scientist, product/process development, or vocational program  Occupational History   Not on file  Tobacco Use   Smoking status: Never    Passive exposure: Never   Smokeless tobacco: Never  Vaping Use   Vaping status: Never Used  Substance and Sexual Activity   Alcohol use: Not Currently   Drug use: Never   Sexual activity: Yes    Partners: Male    Birth control/protection: Post-menopausal  Other Topics Concern   Not on file  Social History Narrative   Not on file   Social Drivers of Health   Financial Resource Strain: Medium Risk (03/27/2021)   Received from [facility]   Overall Financial Resource Strain (CARDIA)    Difficulty of Paying Living Expenses: Somewhat hard  Food Insecurity: Not on file  Transportation Needs: Unmet Transportation Needs (03/27/2021)   Received from [facility]   PRAPARE - Transportation    Lack of Transportation (Medical): Yes    Lack of Transportation (Non-Medical): Yes  Physical Activity: Not on file  Stress: Not on file  Social Connections: Not on file    Allergies:  Allergies  Allergen Reactions   Bactrim [Sulfamethoxazole-Trimethoprim] Anaphylaxis   Gadolinium Anaphylaxis   Gadolinium Derivatives Anaphylaxis   Iodinated Contrast Media Anaphylaxis   Iodine Anaphylaxis   Other Nausea And Vomiting, Rash and Hives    Uncoded Allergy. Allergen: BEZONZTATE Uncoded Allergy. Allergen: decongestants   Sulfa Antibiotics Anaphylaxis and Other (See  Comments)    Uncoded Allergy. Allergen: decongestants Uncoded Allergy. Allergen: decongestants    Azithromycin Hives   Clarithromycin Other (See Comments)   Furosemide Other (See Comments)   Guaifenesin Nausea And Vomiting   Macrobid [Nitrofurantoin Monohyd Macro] Swelling    Tongue swelling, chest tightness    Nitrofurantoin Hives   Penicillins Hives   Allegra [Fexofenadine] Palpitations    Heart Racing    Amoxicillin Rash   Doxycycline Rash and Other (See Comments)   Keflex [Cephalexin] Rash and Other (See Comments)   Levofloxacin Rash    Metabolic Disorder Labs: Lab Results  Component Value Date   HGBA1C 5.5 04/17/2022   MPG 111 04/17/2022   No results found for: PROLACTIN Lab Results  Component Value Date   CHOL 171 04/17/2022   TRIG 145 04/17/2022   HDL 56 04/17/2022   CHOLHDL 3.1 04/17/2022  LDLCALC 90 04/17/2022   Lab Results  Component Value Date   TSH 1.909 01/24/2023    Therapeutic Level Labs: No results found for: LITHIUM No results found for: VALPROATE No results found for: CBMZ  Current Medications: Current Outpatient Medications  Medication Sig Dispense Refill   albuterol  (VENTOLIN  HFA) 108 (90 Base) MCG/ACT inhaler Inhale 1-2 puffs into the lungs every 4 (four) hours as needed for wheezing or shortness of breath. 6.7 each 5   ALPRAZolam  (XANAX ) 1 MG tablet Take 1 mg by mouth 3 (three) times daily as needed for anxiety. 1 mg three times a day and 2 mg at bedtime     amitriptyline  (ELAVIL ) 50 MG tablet TAKE 1 TABLET BY MOUTH EVERYDAY AT BEDTIME 90 tablet 1   amphetamine-dextroamphetamine (ADDERALL XR) 20 MG 24 hr capsule Take 20 mg by mouth every morning.     budesonide -formoterol  (SYMBICORT ) 80-4.5 MCG/ACT inhaler Inhale 2 puffs into the lungs 2 (two) times daily. 30.6 g 12   cetirizine  (ZYRTEC ) 10 MG tablet TAKE 1 TABLET BY MOUTH EVERY DAY 90 tablet 0   Cholecalciferol (VITAMIN D3) 125 MCG (5000 UT) CAPS Take by mouth.     fluticasone   (FLONASE ) 50 MCG/ACT nasal spray SPRAY 2 SPRAYS INTO EACH NOSTRIL EVERY DAY 48 mL 1   triamcinolone  cream (KENALOG ) 0.5 % Apply 1 Application topically 3 (three) times daily. 30 g 0   valsartan  (DIOVAN ) 80 MG tablet Take 1 tablet (80 mg total) by mouth daily. 30 tablet 11   No current facility-administered medications for this visit.     Musculoskeletal: Strength & Muscle Tone: within normal limits Gait & Station: normal Patient leans: N/A  Psychiatric Specialty Exam: Review of Systems  Constitutional: Negative.   HENT: Negative.    Eyes: Negative.   Respiratory: Negative.    Cardiovascular: Negative.   Gastrointestinal: Negative.   Endocrine: Negative.   Genitourinary: Negative.   Musculoskeletal: Negative.   Skin: Negative.   Allergic/Immunologic: Negative.   Neurological: Negative.   Hematological: Negative.   Psychiatric/Behavioral:  Positive for decreased concentration and dysphoric mood. The patient is nervous/anxious.     Blood pressure 118/80, pulse 85, temperature 98.3 F (36.8 C), temperature source Temporal, height 5' 5 (1.651 m), weight 178 lb 12.8 oz (81.1 kg), SpO2 100%.Body mass index is 29.75 kg/m.  General Appearance: Well Groomed  Eye Contact:  Good  Speech:  Pressured  Volume:  Normal  Mood:  Anxious  Affect:  Constricted  Thought Process:  Coherent  Orientation:  Full (Time, Place, and Person)  Thought Content: Obsessions   Suicidal Thoughts:  No  Homicidal Thoughts:  No  Memory:  Immediate;   Good Recent;   Good Remote;   Good  Judgement:  Fair  Insight:  Fair  Psychomotor Activity:  Normal  Concentration:  Concentration: Fair and Attention Span: Fair  Recall:  Good  Fund of Knowledge: Good  Language: Good  Akathisia:  No  Handed:  Right  AIMS (if indicated):   Assets:  Desire for Improvement Financial Resources/Insurance Housing  ADL's:  Intact  Cognition: WNL  Sleep:  Good   Screenings: GAD-7    Flowsheet Row Office Visit from  01/12/2024 in [facility] Office Visit from 05/07/2023 in [facility] Office Visit from 02/03/2023 in [facility] Office Visit from 10/14/2022 in [facility] Video Visit from 07/29/2022 in [facility]  Total GAD-7 Score 18  18 7 21 9    PHQ2-9    Flowsheet Row Office Visit from 01/12/2024 in [facility] Office Visit from 05/07/2023 in [facility] Office Visit from 02/03/2023 in [facility] Office Visit from 12/04/2022 in [facility] Office Visit from 10/14/2022 in [facility]  PHQ-2 Total Score 4 3 2 5 6   PHQ-9 Total Score 11 21 8 20 21    Flowsheet Row Office Visit from 01/12/2024 in [facility] ED from 02/25/2023 in [facility] Emergency Department at [facility] ED from 01/24/2023 in [facility] Emergency Department at [facility]  C-SSRS RISK CATEGORY No Risk No Risk No Risk   Assessment and Plan:  Assessment - Diagnosis: Benzodiazepine dependence (HCC) [F13.20]   - Progress: Baseline appointment - Risk Factors: Worsening symptoms, seizure risk, withdrawal symptoms of benzodiazepine   Plan - Medications:  Continue taking Xanax  1mg  TID for anxiety, prescribed by another psychiatrist, and follow up with [facility] if you would like to taper off of benzos.  Patient continues to see other providers and has been educated that she can only see one provider if she wants to do benzodiazepine taper with [facility]. - Psychotherapy: Currently working with a therapist. - Education: PT educated on how to reach this provider with MyChart and calling the clinic.  Pt educated on medication, purpose, side effects and adverse reactions.  - Follow-Up: No follow-up, patient will call back and set  appointment if she is ready for benzo taper - Referrals: No referrals - Safety Planning:  The patient has been educated, if they should have suicidal thoughts with or without a plan to call 911, or go to the closest emergency department.  Pt verbalized understanding.  Pt denies firearms within the home.  Pt also agrees to call the clinic should they have worsening symptoms before the next appointment.  Patient has also been advised that she experienced benzodiazepine withdrawals and is unable to get a prescription to go to the emergency department.  Patient/Guardian was advised Release of Information must be obtained prior to any record release in order to collaborate their care with an outside provider. Patient/Guardian was advised if they have not already done so to contact the registration department to sign all necessary forms in order for us  to release information regarding their care.   Consent: Patient/Guardian gives verbal consent for treatment and assignment of benefits for services provided during this visit. Patient/Guardian expressed understanding and agreed to proceed.    Dorn Jama Der, NP 01/28/2024, 12:40 PM

## 2024-03-22 ENCOUNTER — Other Ambulatory Visit

## 2024-03-31 ENCOUNTER — Telehealth: Payer: Self-pay

## 2024-03-31 NOTE — Telephone Encounter (Signed)
 Patient called about an abscessed tooth and that she will never go to [facility] again, she was asking for you to take care of her abscessed tooth, I told her she would have to go to the Urgent care.

## 2024-04-04 ENCOUNTER — Telehealth: Payer: Self-pay

## 2024-04-04 NOTE — Telephone Encounter (Signed)
 Pt LM asking for callback did not state what she needed

## 2024-04-12 ENCOUNTER — Other Ambulatory Visit

## 2024-04-19 ENCOUNTER — Other Ambulatory Visit: Payer: Self-pay

## 2024-04-19 ENCOUNTER — Telehealth: Payer: Self-pay

## 2024-04-19 DIAGNOSIS — J4530 Mild persistent asthma, uncomplicated: Secondary | ICD-10-CM

## 2024-04-19 DIAGNOSIS — U071 COVID-19: Secondary | ICD-10-CM

## 2024-04-19 MED ORDER — ALBUTEROL SULFATE HFA 108 (90 BASE) MCG/ACT IN AERS: 1.0000 | INHALATION_SPRAY | RESPIRATORY_TRACT | 5 refills | Status: AC | PRN

## 2024-04-19 NOTE — Telephone Encounter (Signed)
 Pt left VM requesting an appt is made for her with Shirley [name].

## 2024-04-21 ENCOUNTER — Encounter: Payer: Self-pay | Admitting: Cardiology

## 2024-04-22 ENCOUNTER — Other Ambulatory Visit

## 2024-04-22 ENCOUNTER — Other Ambulatory Visit: Payer: Self-pay | Admitting: Cardiology

## 2024-04-22 DIAGNOSIS — I10 Essential (primary) hypertension: Secondary | ICD-10-CM

## 2024-04-22 DIAGNOSIS — E785 Hyperlipidemia, unspecified: Secondary | ICD-10-CM

## 2024-04-22 DIAGNOSIS — I340 Nonrheumatic mitral (valve) insufficiency: Secondary | ICD-10-CM

## 2024-04-22 MED ORDER — VALSARTAN 80 MG PO TABS: 80.0000 mg | ORAL_TABLET | Freq: Every day | ORAL | 11 refills | Status: AC

## 2024-04-23 ENCOUNTER — Other Ambulatory Visit: Payer: Self-pay | Admitting: Family Medicine

## 2024-04-25 ENCOUNTER — Ambulatory Visit: Admitting: Cardiology

## 2024-04-25 NOTE — Telephone Encounter (Signed)
 Not current patient Requested Prescriptions  Pending Prescriptions Disp Refills   acyclovir  (ZOVIRAX ) 400 MG tablet [Pharmacy Med Name: ACYCLOVIR  400 MG TABLET] 21 tablet 3    Sig: TAKE 1 TABLET BY MOUTH 3 TIMES DAILY.     There is no refill protocol information for this order

## 2024-04-28 ENCOUNTER — Ambulatory Visit: Admitting: Cardiology

## 2024-04-28 ENCOUNTER — Ambulatory Visit: Payer: Self-pay | Admitting: Cardiology

## 2024-04-28 ENCOUNTER — Encounter: Payer: Self-pay | Admitting: Cardiology

## 2024-04-28 VITALS — BP 125/83 | HR 79 | Ht 65.0 in | Wt 181.0 lb

## 2024-04-28 DIAGNOSIS — K219 Gastro-esophageal reflux disease without esophagitis: Secondary | ICD-10-CM | POA: Insufficient documentation

## 2024-04-28 DIAGNOSIS — R8290 Unspecified abnormal findings in urine: Secondary | ICD-10-CM | POA: Diagnosis not present

## 2024-04-28 DIAGNOSIS — Z0130 Encounter for examination of blood pressure without abnormal findings: Secondary | ICD-10-CM

## 2024-04-28 LAB — POCT URINALYSIS DIPSTICK
Bilirubin, UA: NEGATIVE
Glucose, UA: NEGATIVE
Ketones, UA: NEGATIVE
Leukocytes, UA: NEGATIVE
Nitrite, UA: NEGATIVE
Protein, UA: NEGATIVE
Spec Grav, UA: 1.015 (ref 1.010–1.025)
Urobilinogen, UA: 0.2 U/dL
pH, UA: 6 (ref 5.0–8.0)

## 2024-04-28 MED ORDER — PANTOPRAZOLE SODIUM 20 MG PO TBEC: 20.0000 mg | DELAYED_RELEASE_TABLET | Freq: Every day | ORAL | 4 refills | Status: DC

## 2024-04-28 NOTE — Progress Notes (Signed)
 Established Patient Office Visit  Subjective:  Patient ID: Shirley [name], female    DOB: [birth year]  Age: 53 y.o. MRN: [id number]  Chief Complaint  Patient presents with   Follow-up    Patient in office for an acute visit, requesting a UA. Patient has multiple complaints today.  Patient is non-complaint. Patient has not had blood work done that was ordered in 04/2023. Mammogram ordered, not done. Patient has been referred to different urologists for frequent UTIs, has not scheduled. Phone numbers given to patient to schedule with urology and to schedule her mammogram. Patient states she will return to get fasting lab work.  Patient states her dentist mentioned she may have acid reflux due to changes in her teeth, waking up with acid taste in her mouth. Will send in pantoprazole.  UA today normal, will send for culture.     No other concerns at this time.   Past Medical History:  Diagnosis Date   Abnormal Pap smear of cervix    ADHD (attention deficit hyperactivity disorder)    Anxiety    Frequent headaches    Hypertension    Mitral valve prolapse    Ovarian cyst    Spina bifida occulta    UTI (urinary tract infection)     Past Surgical History:  Procedure Laterality Date   LEEP      Social History   Socioeconomic History   Marital status: Divorced    Spouse name: Not on file   Number of children: 2   Years of education: Not on file   Highest education level: Associate degree: occupational, Scientist, product/process development, or vocational program  Occupational History   Not on file  Tobacco Use   Smoking status: Never    Passive exposure: Never   Smokeless tobacco: Never  Vaping Use   Vaping status: Never Used  Substance and Sexual Activity   Alcohol use: Not Currently   Drug use: Never   Sexual activity: Yes    Partners: Male    Birth control/protection: Post-menopausal  Other Topics Concern   Not on file  Social History Narrative   Not on file   Social Drivers of Health    Financial Resource Strain: Medium Risk (03/27/2021)   Received from [facility]   Overall Financial Resource Strain (CARDIA)    Difficulty of Paying Living Expenses: Somewhat hard  Food Insecurity: Not on file  Transportation Needs: Unmet Transportation Needs (03/27/2021)   Received from  [facility]   PRAPARE - Transportation    Lack of Transportation (Medical): Yes    Lack of Transportation (Non-Medical): Yes  Physical Activity: Not on file  Stress: Not on file  Social Connections: Not on file  Intimate Partner Violence: Not on file    Family History  Problem Relation Age of Onset   Ovarian cancer Mother    Pancreatic cancer Mother    Diabetes Mellitus II Mother    Stroke Father    Heart disease Father    Cancer Father    Diabetes Mellitus II Father    Coronary artery disease Father    Mental illness Father    Anxiety disorder Sister    Mitral valve prolapse Sister    Heart attack Sister    Drug abuse Daughter     Allergies  Allergen Reactions   Bactrim [Sulfamethoxazole-Trimethoprim] Anaphylaxis   Gadolinium Anaphylaxis   Gadolinium Derivatives Anaphylaxis   Iodinated Contrast Media Anaphylaxis   Iodine Anaphylaxis   Other Nausea And Vomiting, Rash  and Hives    Uncoded Allergy. Allergen: BEZONZTATE Uncoded Allergy. Allergen: decongestants   Sulfa Antibiotics Anaphylaxis and Other (See Comments)    Uncoded Allergy. Allergen: decongestants Uncoded Allergy. Allergen: decongestants    Azithromycin Hives   Clarithromycin Other (See Comments)   Furosemide Other (See Comments)   Guaifenesin Nausea And Vomiting   Macrobid [Nitrofurantoin Monohyd Macro] Swelling    Tongue swelling, chest tightness    Nitrofurantoin Hives   Penicillins Hives   Allegra [Fexofenadine] Palpitations    Heart Racing    Amoxicillin Rash   Doxycycline Rash and Other (See Comments)   Keflex [Cephalexin] Rash and Other (See Comments)   Levofloxacin Rash    Outpatient  Medications Prior to Visit  Medication Sig   albuterol  (VENTOLIN  HFA) 108 (90 Base) MCG/ACT inhaler Inhale 1-2 puffs into the lungs every 4 (four) hours as needed for wheezing or shortness of breath.   ALPRAZolam  (XANAX ) 1 MG tablet Take 1 mg by mouth 3 (three) times daily as needed for anxiety. 1 mg three times a day and 2 mg at bedtime   budesonide -formoterol  (SYMBICORT ) 80-4.5 MCG/ACT inhaler Inhale 2 puffs into the lungs 2 (two) times daily.   fluticasone  (FLONASE ) 50 MCG/ACT nasal spray SPRAY 2 SPRAYS INTO EACH NOSTRIL EVERY DAY   valsartan  (DIOVAN ) 80 MG tablet Take 1 tablet (80 mg total) by mouth daily.   amitriptyline  (ELAVIL ) 50 MG tablet TAKE 1 TABLET BY MOUTH EVERYDAY AT BEDTIME (Patient not taking: Reported on 04/28/2024)   amphetamine-dextroamphetamine (ADDERALL XR) 20 MG 24 hr capsule Take 20 mg by mouth every morning. (Patient not taking: Reported on 04/28/2024)   cetirizine  (ZYRTEC ) 10 MG tablet TAKE 1 TABLET BY MOUTH EVERY DAY (Patient not taking: Reported on 04/28/2024)   Cholecalciferol (VITAMIN D3) 125 MCG (5000 UT) CAPS Take by mouth. (Patient not taking: Reported on 04/28/2024)   triamcinolone  cream (KENALOG ) 0.5 % Apply 1 Application topically 3 (three) times daily. (Patient not taking: Reported on 04/28/2024)   No facility-administered medications prior to visit.    Review of Systems  Constitutional: Negative.   HENT: Negative.    Eyes: Negative.   Respiratory: Negative.  Negative for shortness of breath.   Cardiovascular: Negative.  Negative for chest pain.  Gastrointestinal: Negative.  Negative for abdominal pain, constipation and diarrhea.  Genitourinary: Negative.   Musculoskeletal:  Negative for joint pain and myalgias.  Skin: Negative.   Neurological: Negative.  Negative for dizziness and headaches.  Endo/Heme/Allergies: Negative.   All other systems reviewed and are negative.      Objective:   BP 125/83   Pulse 79   Ht 5' 5 (1.651 m)   Wt 181 lb (82.1  kg)   LMP  (LMP Unknown) Comment: last cycle over a year ago  SpO2 99%   BMI 30.12 kg/m   Vitals:   04/28/24 0941  BP: 125/83  Pulse: 79  Height: 5' 5 (1.651 m)  Weight: 181 lb (82.1 kg)  SpO2: 99%  BMI (Calculated): 30.12    Physical Exam Vitals and nursing note reviewed.  Constitutional:      Appearance: Normal appearance. She is normal weight.  HENT:     Head: Normocephalic and atraumatic.     Nose: Nose normal.     Mouth/Throat:     Mouth: Mucous membranes are moist.  Eyes:     Extraocular Movements: Extraocular movements intact.     Conjunctiva/sclera: Conjunctivae normal.     Pupils: Pupils are equal, round, and reactive to  light.  Cardiovascular:     Rate and Rhythm: Normal rate and regular rhythm.     Pulses: Normal pulses.     Heart sounds: Normal heart sounds.  Pulmonary:     Effort: Pulmonary effort is normal.     Breath sounds: Normal breath sounds.  Abdominal:     General: Abdomen is flat. Bowel sounds are normal.     Palpations: Abdomen is soft.  Musculoskeletal:        General: Normal range of motion.     Cervical back: Normal range of motion.  Skin:    General: Skin is warm and dry.  Neurological:     General: No focal deficit present.     Mental Status: She is alert and oriented to person, place, and time.  Psychiatric:        Mood and Affect: Mood normal.        Behavior: Behavior normal.        Thought Content: Thought content normal.        Judgment: Judgment normal.      Results for orders placed or performed in visit on 04/28/24  POCT Urinalysis Dipstick (81002)  Result Value Ref Range   Color, UA     Clarity, UA     Glucose, UA Negative Negative   Bilirubin, UA Negative    Ketones, UA Negative    Spec Grav, UA 1.015 1.010 - 1.025   Blood, UA 2+    pH, UA 6.0 5.0 - 8.0   Protein, UA Negative Negative   Urobilinogen, UA 0.2 0.2 or 1.0 E.U./dL   Nitrite, UA Negative    Leukocytes, UA Negative Negative   Appearance     Odor       Recent Results (from the past 2160 hours)  POCT Urinalysis Dipstick (18997)     Status: Abnormal   Collection Time: 04/28/24  9:56 AM  Result Value Ref Range   Color, UA     Clarity, UA     Glucose, UA Negative Negative   Bilirubin, UA Negative    Ketones, UA Negative    Spec Grav, UA 1.015 1.010 - 1.025   Blood, UA 2+    pH, UA 6.0 5.0 - 8.0   Protein, UA Negative Negative   Urobilinogen, UA 0.2 0.2 or 1.0 E.U./dL   Nitrite, UA Negative    Leukocytes, UA Negative Negative   Appearance     Odor        Assessment & Plan:  Schedule with urology Schedule mammogram Return for fasting lab work Pantoprazole Urine culture  Problem List Items Addressed This Visit       Digestive   Gastroesophageal reflux disease without esophagitis - Primary   Relevant Medications   pantoprazole (PROTONIX) 20 MG tablet     Other   Abnormal urine   Relevant Orders   POCT Urinalysis Dipstick (18997) (Completed)   Urine Culture    Return in about 3 months (around 07/29/2024).   Total time spent: 25 minutes  [employer], NP  04/28/2024   This document may have been prepared by Dragon Voice Recognition software and as such may include unintentional dictation errors.

## 2024-04-28 NOTE — Patient Instructions (Addendum)
  BLOODWORK!!!   Provider: Delon Eans Type of Appt: New Urology on 03/21/24  Asked the patient to call back at [phone].  Thanks, Winford Nay Artera message sent 03/10/24 MyChart message sent 03/11/24   [facility] [address], [address] [place],  [place]  [address]  Main: [phone]

## 2024-04-29 ENCOUNTER — Telehealth: Payer: Self-pay

## 2024-04-29 ENCOUNTER — Other Ambulatory Visit: Payer: Self-pay | Admitting: Cardiology

## 2024-04-29 MED ORDER — ACYCLOVIR 400 MG PO TABS: 400.0000 mg | ORAL_TABLET | Freq: Three times a day (TID) | ORAL | 1 refills | Status: DC

## 2024-04-29 NOTE — Telephone Encounter (Signed)
 Patient Shirley [name] asking about her urine results she said since they were abnormal

## 2024-05-02 ENCOUNTER — Telehealth: Payer: Self-pay

## 2024-05-02 ENCOUNTER — Ambulatory Visit: Payer: Medicaid Other | Admitting: Urology

## 2024-05-02 NOTE — Telephone Encounter (Signed)
 Patient has called again asking about her labs again from the UA states that it came back abnormal and wants to know what she should do she doesn't have an appt with urology until a month away but she's burning and cramping

## 2024-05-02 NOTE — Telephone Encounter (Signed)
 Also mentioned something about [facility] sending her something about kidney failure and is she supposed to figure out these results on her own, this is VM message # 3 from today as well as multiple mychart messages sent, she also didn't hang up the phone all the way and said something along the lines that they already treat me like shit anyway  and then the phone disconnected

## 2024-05-02 NOTE — Telephone Encounter (Signed)
 Patient has left 2 messages as well as sent portal messages this morning about her urine results she called at 9:16 and 932 stating that her urine is orange and she having cramps and its burning when she pees, pt was previously advised to call urology that she was set up with

## 2024-05-03 ENCOUNTER — Other Ambulatory Visit: Payer: Self-pay | Admitting: Cardiology

## 2024-05-03 MED ORDER — CLINDAMYCIN HCL 300 MG PO CAPS: 300.0000 mg | ORAL_CAPSULE | Freq: Three times a day (TID) | ORAL | 0 refills | Status: AC

## 2024-05-03 MED ORDER — PHENAZOPYRIDINE HCL 200 MG PO TABS: 200.0000 mg | ORAL_TABLET | Freq: Three times a day (TID) | ORAL | 0 refills | Status: AC | PRN

## 2024-05-03 NOTE — Telephone Encounter (Signed)
 Patient informed via betty

## 2024-05-12 ENCOUNTER — Other Ambulatory Visit

## 2024-05-17 ENCOUNTER — Encounter: Payer: Self-pay | Admitting: Obstetrics & Gynecology

## 2024-05-17 ENCOUNTER — Ambulatory Visit: Admitting: Obstetrics & Gynecology

## 2024-05-17 VITALS — BP 118/74 | HR 76 | Ht 65.0 in | Wt 186.0 lb

## 2024-05-17 DIAGNOSIS — R5383 Other fatigue: Secondary | ICD-10-CM | POA: Diagnosis not present

## 2024-05-17 DIAGNOSIS — N83201 Unspecified ovarian cyst, right side: Secondary | ICD-10-CM | POA: Diagnosis not present

## 2024-05-17 DIAGNOSIS — Z800 Family history of malignant neoplasm of digestive organs: Secondary | ICD-10-CM

## 2024-05-17 DIAGNOSIS — Z803 Family history of malignant neoplasm of breast: Secondary | ICD-10-CM

## 2024-05-17 DIAGNOSIS — Z8041 Family history of malignant neoplasm of ovary: Secondary | ICD-10-CM

## 2024-05-17 DIAGNOSIS — Z1231 Encounter for screening mammogram for malignant neoplasm of breast: Secondary | ICD-10-CM | POA: Insufficient documentation

## 2024-05-17 NOTE — Progress Notes (Addendum)
    GYNECOLOGY PROGRESS NOTE  Subjective:    Patient ID: Shirley [name], female    DOB: Aug 04, 1970, 53 y.o.   MRN: [id number]  HPI  Patient is a 53 y.o.  happily divorced [id number] (33yo daughter and 21 yo son and 3 grands). She is here today to discuss surgery. Her ultrasound 10/2023 showed the following: Right Adnexal cyst measuring 7.3 x 5.1 x 7.6 cm (previously measured cyst: 6.7 x 5.0 cm on ultrasound from 06/2022) Left Ovary was not seen. This is slightly larger than the previous study. She does have chronic RLQ discomfort.  She stopped OCPs in 2019 and never had a period since then.   She has has menopausal symptoms including fatigue, poor sleeping, weight gain, hot flashes, night sweats, irritability.   The following portions of the patient's history were reviewed and updated as appropriate: allergies, current medications, past family history, past medical history, past social history, past surgical history, and problem list.  Review of Systems Pertinent items are noted in HPI.  She has been abstinent since 2023.  FH- + pancreas and ovarian in her mother + sister with pancreas (deceased) + prostate cancer in her father + colon cancer in paternal GF + kidney and bladder cancer in her maternal aunt + breast cancer in a different maternal aunt  Objective:   Blood pressure 118/74, pulse 76, height 5' 5 (1.651 m), weight 186 lb (84.4 kg). Body mass index is 30.95 kg/m. Well nourished, well hydrated White female, no apparent distress She is ambulating and conversing normally.   Assessment:  Chronic enlarging right ovarian cyst- check CA 125 2.   FH of pancreas, colon, kidney, prostate and ovarian cancer Plan:  She needs routine to start with and then further screening prn Genetic testing today. I will base my recommendations for surgery on the genetic testing results I referred her to GI for colonoscopy.

## 2024-05-18 LAB — TSH+FREE T4
Free T4: 0.92 ng/dL (ref 0.82–1.77)
TSH: 1.1 u[IU]/mL (ref 0.450–4.500)

## 2024-05-18 LAB — CA 125: Cancer Antigen (CA) 125: 9.8 U/mL (ref 0.0–38.1)

## 2024-05-18 NOTE — Telephone Encounter (Signed)
 I called her to give her the normal results of her CA 125.

## 2024-05-23 ENCOUNTER — Other Ambulatory Visit: Payer: Self-pay | Admitting: Cardiology

## 2024-05-25 ENCOUNTER — Telehealth: Payer: Self-pay | Admitting: Cardiology

## 2024-05-25 ENCOUNTER — Encounter: Payer: Self-pay | Admitting: Cardiology

## 2024-05-25 ENCOUNTER — Other Ambulatory Visit: Payer: Self-pay | Admitting: Cardiology

## 2024-05-25 DIAGNOSIS — J309 Allergic rhinitis, unspecified: Secondary | ICD-10-CM

## 2024-05-25 MED ORDER — CETIRIZINE HCL 10 MG PO TABS: 10.0000 mg | ORAL_TABLET | Freq: Every day | ORAL | 0 refills | Status: DC

## 2024-05-26 ENCOUNTER — Encounter: Payer: Self-pay | Admitting: Cardiology

## 2024-05-26 ENCOUNTER — Telehealth: Payer: Self-pay | Admitting: Obstetrics & Gynecology

## 2024-05-26 ENCOUNTER — Ambulatory Visit: Admitting: Cardiology

## 2024-05-26 VITALS — BP 126/74 | HR 76 | Ht 65.0 in | Wt 179.8 lb

## 2024-05-26 DIAGNOSIS — J0110 Acute frontal sinusitis, unspecified: Secondary | ICD-10-CM | POA: Diagnosis not present

## 2024-05-26 DIAGNOSIS — J309 Allergic rhinitis, unspecified: Secondary | ICD-10-CM | POA: Diagnosis not present

## 2024-05-26 DIAGNOSIS — Z0130 Encounter for examination of blood pressure without abnormal findings: Secondary | ICD-10-CM

## 2024-05-26 MED ORDER — FLUTICASONE PROPIONATE 50 MCG/ACT NA SUSP: 2.0000 | Freq: Every day | NASAL | 1 refills | Status: AC

## 2024-05-26 MED ORDER — CLINDAMYCIN HCL 300 MG PO CAPS: 300.0000 mg | ORAL_CAPSULE | Freq: Three times a day (TID) | ORAL | 0 refills | Status: AC

## 2024-05-26 NOTE — Telephone Encounter (Signed)
 Patient wanted to let Dr. Starla know the reason why she hasn't been coming to her appointments.  It is due to insurance.  Not sure if she will have it or not

## 2024-05-26 NOTE — Progress Notes (Signed)
 Established Patient Office Visit  Subjective:  Patient ID: Shirley [name], female    DOB: 1970/09/05  Age: 53 y.o. MRN: [id number]  Chief Complaint  Patient presents with   Acute Visit    Tested negative at home for COVID and flu, experiencing nasal drainage,congestion, watery eyes, coughing up green flem, fatigued, headache, right ear pain, previously had a fever of 101 at home.    Patient in office for an acute visit, complaining of nasal drainage, congestion, watery eyes, coughing up green phlegm, fatigue, headache, right ear pain, previous fever of 101. Home Covid/Flu test negative. Will send in Clindamycin  and Flonase . Drink plenty of water.   URI  This is a new problem. The current episode started in the past 7 days. The problem has been unchanged. The maximum temperature recorded prior to her arrival was 101 - 101.9 F. The fever has been present for Less than 1 day. Associated symptoms include congestion, coughing, ear pain, headaches, rhinorrhea, sinus pain and sneezing. Pertinent negatives include no abdominal pain, chest pain, diarrhea or joint pain. Treatments tried: Mucinex. The treatment provided no relief.    No other concerns at this time.   Past Medical History:  Diagnosis Date   Abnormal Pap smear of cervix    ADHD (attention deficit hyperactivity disorder)    Anxiety    Frequent headaches    Hypertension    Mitral valve prolapse    Ovarian cyst    Spina bifida occulta    UTI (urinary tract infection)     Past Surgical History:  Procedure Laterality Date   LEEP      Social History   Socioeconomic History   Marital status: Divorced    Spouse name: Not on file   Number of children: 2   Years of education: Not on file   Highest education level: Associate degree: occupational, scientist, product/process development, or vocational program  Occupational History   Not on file  Tobacco Use   Smoking status: Never    Passive exposure: Never   Smokeless tobacco: Never  Vaping Use    Vaping status: Never Used  Substance and Sexual Activity   Alcohol use: Not Currently   Drug use: Never   Sexual activity: Yes    Partners: Male    Birth control/protection: Post-menopausal  Other Topics Concern   Not on file  Social History Narrative   Not on file   Social Drivers of Health   Financial Resource Strain: Medium Risk (03/27/2021)   Received from [facility]   Overall Financial Resource Strain (CARDIA)    Difficulty of Paying Living Expenses: Somewhat hard  Food Insecurity: Not on file  Transportation Needs: Unmet Transportation Needs (03/27/2021)   Received from [facility]   PRAPARE - Transportation    Lack of Transportation (Medical): Yes    Lack of Transportation (Non-Medical): Yes  Physical Activity: Not on file  Stress: Not on file  Social Connections: Not on file  Intimate Partner Violence: Not on file    Family History  Problem Relation Age of Onset   Ovarian cancer Mother    Pancreatic cancer Mother    Diabetes Mellitus II Mother    Stroke Father    Heart disease Father    Cancer Father    Diabetes Mellitus II Father    Coronary artery disease Father    Mental illness Father    Anxiety disorder Sister    Mitral valve prolapse Sister    Heart attack Sister  Drug abuse Daughter     Allergies  Allergen Reactions   Bactrim [Sulfamethoxazole-Trimethoprim] Anaphylaxis   Gadolinium Anaphylaxis   Gadolinium Derivatives Anaphylaxis   Iodinated Contrast Media Anaphylaxis   Iodine Anaphylaxis   Other Nausea And Vomiting, Rash and Hives    Uncoded Allergy. Allergen: BEZONZTATE Uncoded Allergy. Allergen: decongestants   Sulfa Antibiotics Anaphylaxis and Other (See Comments)    Uncoded Allergy. Allergen: decongestants Uncoded Allergy. Allergen: decongestants    Azithromycin Hives   Clarithromycin Other (See Comments)   Furosemide Other (See Comments)   Guaifenesin Nausea And Vomiting   Macrobid [Nitrofurantoin Monohyd Macro] Swelling     Tongue swelling, chest tightness    Nitrofurantoin Hives   Penicillins Hives   Allegra [Fexofenadine] Palpitations    Heart Racing    Amoxicillin Rash   Doxycycline Rash and Other (See Comments)   Keflex [Cephalexin] Rash and Other (See Comments)   Levofloxacin Rash    Outpatient Medications Prior to Visit  Medication Sig   acyclovir  (ZOVIRAX ) 400 MG tablet TAKE 1 TABLET BY MOUTH 3 TIMES DAILY.   albuterol  (VENTOLIN  HFA) 108 (90 Base) MCG/ACT inhaler Inhale 1-2 puffs into the lungs every 4 (four) hours as needed for wheezing or shortness of breath.   ALPRAZolam  (XANAX ) 1 MG tablet Take 1 mg by mouth 3 (three) times daily as needed for anxiety. 1 mg three times a day and 2 mg at bedtime   budesonide -formoterol  (SYMBICORT ) 80-4.5 MCG/ACT inhaler Inhale 2 puffs into the lungs 2 (two) times daily.   cetirizine  (ZYRTEC ) 10 MG tablet Take 1 tablet (10 mg total) by mouth daily.   valsartan  (DIOVAN ) 80 MG tablet Take 1 tablet (80 mg total) by mouth daily.   [DISCONTINUED] fluticasone  (FLONASE ) 50 MCG/ACT nasal spray SPRAY 2 SPRAYS INTO EACH NOSTRIL EVERY DAY   amitriptyline  (ELAVIL ) 50 MG tablet TAKE 1 TABLET BY MOUTH EVERYDAY AT BEDTIME (Patient not taking: Reported on 05/26/2024)   amphetamine-dextroamphetamine (ADDERALL XR) 20 MG 24 hr capsule Take 20 mg by mouth every morning. (Patient not taking: Reported on 05/26/2024)   Cholecalciferol (VITAMIN D3) 125 MCG (5000 UT) CAPS Take by mouth. (Patient not taking: Reported on 05/26/2024)   pantoprazole (PROTONIX) 20 MG tablet TAKE 1 TABLET BY MOUTH EVERY DAY (Patient not taking: Reported on 05/26/2024)   triamcinolone  cream (KENALOG ) 0.5 % Apply 1 Application topically 3 (three) times daily. (Patient not taking: Reported on 05/26/2024)   No facility-administered medications prior to visit.    Review of Systems  Constitutional: Negative.   HENT:  Positive for congestion, ear pain, rhinorrhea, sinus pain and sneezing.   Eyes: Negative.    Respiratory:  Positive for cough and sputum production. Negative for shortness of breath.   Cardiovascular: Negative.  Negative for chest pain.  Gastrointestinal: Negative.  Negative for abdominal pain, constipation and diarrhea.  Genitourinary: Negative.   Musculoskeletal:  Negative for joint pain and myalgias.  Skin: Negative.   Neurological:  Positive for headaches. Negative for dizziness.  Endo/Heme/Allergies: Negative.   All other systems reviewed and are negative.      Objective:   BP 126/74   Pulse 76   Ht 5' 5 (1.651 m)   Wt 179 lb 12.8 oz (81.6 kg)   LMP  (LMP Unknown) Comment: last cycle over a year ago  SpO2 98%   BMI 29.92 kg/m   Vitals:   05/26/24 1531  BP: 126/74  Pulse: 76  Height: 5' 5 (1.651 m)  Weight: 179 lb 12.8 oz (81.6 kg)  SpO2: 98%  BMI (Calculated): 29.92    Physical Exam Vitals and nursing note reviewed.  Constitutional:      Appearance: Normal appearance. She is normal weight.  HENT:     Head: Normocephalic and atraumatic.     Nose: Nose normal.     Mouth/Throat:     Mouth: Mucous membranes are moist.  Eyes:     Extraocular Movements: Extraocular movements intact.     Conjunctiva/sclera: Conjunctivae normal.     Pupils: Pupils are equal, round, and reactive to light.  Cardiovascular:     Rate and Rhythm: Normal rate and regular rhythm.     Pulses: Normal pulses.     Heart sounds: Normal heart sounds.  Pulmonary:     Effort: Pulmonary effort is normal.     Breath sounds: Normal breath sounds.  Abdominal:     General: Abdomen is flat. Bowel sounds are normal.     Palpations: Abdomen is soft.  Musculoskeletal:        General: Normal range of motion.     Cervical back: Normal range of motion.  Skin:    General: Skin is warm and dry.  Neurological:     General: No focal deficit present.     Mental Status: She is alert and oriented to person, place, and time.  Psychiatric:        Mood and Affect: Mood normal.        Behavior:  Behavior normal.        Thought Content: Thought content normal.        Judgment: Judgment normal.      No results found for any visits on 05/26/24.  Recent Results (from the past 2160 hours)  POCT Urinalysis Dipstick (18997)     Status: Abnormal   Collection Time: 04/28/24  9:56 AM  Result Value Ref Range   Color, UA     Clarity, UA     Glucose, UA Negative Negative   Bilirubin, UA Negative    Ketones, UA Negative    Spec Grav, UA 1.015 1.010 - 1.025   Blood, UA 2+    pH, UA 6.0 5.0 - 8.0   Protein, UA Negative Negative   Urobilinogen, UA 0.2 0.2 or 1.0 E.U./dL   Nitrite, UA Negative    Leukocytes, UA Negative Negative   Appearance     Odor    CA 125     Status: None   Collection Time: 05/17/24 10:59 AM  Result Value Ref Range   Cancer Antigen (CA) 125 9.8 0.0 - 38.1 U/mL    Comment: Roche [employer]ostics Electrochemiluminescence Immunoassay (ECLIA) Values obtained with different assay methods or kits cannot be used interchangeably.  Results cannot be interpreted as absolute evidence of the presence or absence of malignant disease.   TSH + free T4     Status: None   Collection Time: 05/17/24 10:59 AM  Result Value Ref Range   TSH 1.100 0.450 - 4.500 uIU/mL   Free T4 0.92 0.82 - 1.77 ng/dL      Assessment & Plan:  Clindamycin  Flonase  Drink plenty of water  Problem List Items Addressed This Visit       Respiratory   Acute non-recurrent frontal sinusitis - Primary   Relevant Medications   fluticasone  (FLONASE ) 50 MCG/ACT nasal spray   Allergic rhinitis   Relevant Medications   fluticasone  (FLONASE ) 50 MCG/ACT nasal spray    Return if symptoms worsen or fail to improve, for as scheduled.   Total  time spent: 25 minutes. This time includes review of previous notes and results and patient face to face interaction during today's visit.    Jeoffrey Pollen, NP  05/26/2024   This document may have been prepared by [employer] Voice Recognition software and as such  may include unintentional dictation errors.

## 2024-05-27 ENCOUNTER — Other Ambulatory Visit

## 2024-05-28 DIAGNOSIS — Z1371 Encounter for nonprocreative screening for genetic disease carrier status: Secondary | ICD-10-CM

## 2024-05-28 HISTORY — DX: Encounter for nonprocreative screening for genetic disease carrier status: Z13.71

## 2024-06-08 ENCOUNTER — Telehealth: Payer: Self-pay | Admitting: Obstetrics & Gynecology

## 2024-06-08 ENCOUNTER — Other Ambulatory Visit: Payer: Self-pay | Admitting: Cardiology

## 2024-06-08 DIAGNOSIS — H1032 Unspecified acute conjunctivitis, left eye: Secondary | ICD-10-CM

## 2024-06-08 NOTE — Telephone Encounter (Signed)
 I returned her call. She says that she received her genetic studies but on my chart here it says active, no results available.

## 2024-06-14 ENCOUNTER — Encounter: Payer: Self-pay | Admitting: Obstetrics and Gynecology

## 2024-06-14 ENCOUNTER — Encounter: Payer: Self-pay | Admitting: Obstetrics & Gynecology

## 2024-06-21 ENCOUNTER — Ambulatory Visit: Admitting: Obstetrics & Gynecology

## 2024-06-21 NOTE — Progress Notes (Signed)
 She was supposed to have a pelvic ultrasound prior to our visit to discuss the results of said ultrasound. She will reschedule the ultrasound that she cancelled and then schedule an appt with me.

## 2024-07-11 ENCOUNTER — Other Ambulatory Visit: Payer: Self-pay | Admitting: Cardiology

## 2024-07-11 ENCOUNTER — Telehealth: Payer: Self-pay

## 2024-07-11 MED ORDER — CLINDAMYCIN HCL 300 MG PO CAPS: 300.0000 mg | ORAL_CAPSULE | Freq: Two times a day (BID) | ORAL | 0 refills | Status: AC

## 2024-07-11 NOTE — Telephone Encounter (Signed)
Pt informed

## 2024-07-11 NOTE — Telephone Encounter (Signed)
 Pt sates she has a very stuffed nose, a cough with mucus, and a fever. Pt has been taking OTC meds for the past couple of days and it is not improving. Pt is requesting antibiotics be sent in.

## 2024-07-23 ENCOUNTER — Other Ambulatory Visit: Payer: Self-pay | Admitting: Cardiology

## 2024-07-25 ENCOUNTER — Telehealth: Payer: Self-pay

## 2024-07-25 NOTE — Telephone Encounter (Signed)
 Pt states she was sick and was told clindamycin  would get sent in. Pt never picked it up from the pharmacy because she started to feel better however is sick again with a stomach bug and is hoping to have something sent in.

## 2024-07-26 ENCOUNTER — Telehealth: Payer: Self-pay

## 2024-07-26 ENCOUNTER — Encounter: Payer: Self-pay | Admitting: Cardiology

## 2024-07-26 NOTE — Telephone Encounter (Signed)
 Pt states she has a head cold and fever and was given it to help with this but then began vomiting so she never picked it up. Pt claims the medication went missing at the pharmacy and is wanting that or an alternative sent in to help her cold.

## 2024-07-27 ENCOUNTER — Telehealth: Payer: Self-pay

## 2024-07-27 NOTE — Telephone Encounter (Signed)
 Pt requesting clymicion be sent in for her cold.

## 2024-07-27 NOTE — Telephone Encounter (Signed)
 Error

## 2024-07-27 NOTE — Telephone Encounter (Signed)
 Pt called twice this morning stating she has the flu. Pt states she has a fever and she is too weak to get out of bed.

## 2024-07-29 ENCOUNTER — Other Ambulatory Visit: Payer: Self-pay | Admitting: Cardiology

## 2024-07-29 ENCOUNTER — Ambulatory Visit: Admitting: Cardiology

## 2024-07-29 MED ORDER — CLINDAMYCIN HCL 300 MG PO CAPS: 300.0000 mg | ORAL_CAPSULE | Freq: Two times a day (BID) | ORAL | 0 refills | Status: AC

## 2024-08-05 ENCOUNTER — Encounter: Payer: Self-pay | Admitting: Cardiology

## 2024-08-05 ENCOUNTER — Ambulatory Visit
Admission: RE | Admit: 2024-08-05 | Discharge: 2024-08-05 | Disposition: A | Attending: Cardiology | Admitting: Cardiology

## 2024-08-05 ENCOUNTER — Ambulatory Visit
Admission: RE | Admit: 2024-08-05 | Discharge: 2024-08-05 | Disposition: A | Source: Ambulatory Visit | Attending: Cardiology

## 2024-08-05 ENCOUNTER — Ambulatory Visit: Payer: Self-pay | Admitting: Cardiology

## 2024-08-05 ENCOUNTER — Ambulatory Visit: Admitting: Cardiology

## 2024-08-05 VITALS — BP 148/72 | HR 101 | Ht 65.0 in | Wt 180.0 lb

## 2024-08-05 DIAGNOSIS — R051 Acute cough: Secondary | ICD-10-CM | POA: Insufficient documentation

## 2024-08-05 DIAGNOSIS — I10 Essential (primary) hypertension: Secondary | ICD-10-CM | POA: Diagnosis not present

## 2024-08-05 DIAGNOSIS — J309 Allergic rhinitis, unspecified: Secondary | ICD-10-CM

## 2024-08-05 NOTE — Progress Notes (Signed)
 "  Established Patient Office Visit  Subjective:  Patient ID: Shirley [name], female    DOB: 1970-12-24  Age: 54 y.o. MRN: [id number]  Chief Complaint  Patient presents with   Acute Visit    Sick since Christmas, fever on and off since christmas    Patient in office for an acute visit. Patient complaining of a fever on and off since Christmas. Patient treated with Clindamycin  on 07/29/24. Patient presents today complaining of a cough, congestion. Patient has had symptoms on and off for the past month, unable to treat for flu. Chest xray today. Recommend treating symptoms, nasal spray. Further recommendations based on results of chest xray.  Patient is non-complaint. Patient has not had blood work done that was ordered in 04/2023. Mammogram ordered, not done. Patient has been referred to different urologists for frequent UTIs, has not scheduled. Phone numbers given to patient to schedule with urology and to schedule her mammogram. Patient states she will return to get fasting lab work.    No other concerns at this time.   Past Medical History:  Diagnosis Date   Abnormal Pap smear of cervix    ADHD (attention deficit hyperactivity disorder)    Anxiety    BRCA negative 05/2024   IBIS=11.5%/riskscore=6.7%   Family history of ovarian cancer    Frequent headaches    Hypertension    Mitral valve prolapse    Ovarian cyst    Spina bifida occulta    UTI (urinary tract infection)     Past Surgical History:  Procedure Laterality Date   LEEP      Social History   Socioeconomic History   Marital status: Divorced    Spouse name: Not on file   Number of children: 2   Years of education: Not on file   Highest education level: Associate degree: occupational, scientist, product/process development, or vocational program  Occupational History   Not on file  Tobacco Use   Smoking status: Never    Passive exposure: Never   Smokeless tobacco: Never  Vaping Use   Vaping status: Never Used  Substance and Sexual  Activity   Alcohol use: Not Currently   Drug use: Never   Sexual activity: Yes    Partners: Male    Birth control/protection: Post-menopausal  Other Topics Concern   Not on file  Social History Narrative   Not on file   Social Drivers of Health   Tobacco Use: Low Risk  (08/05/2024)   Received from [facility]   Patient History    Smoking Tobacco Use: Never    Smokeless Tobacco Use: Never    Passive Exposure: Not on file  Financial Resource Strain: Not on file  Food Insecurity: Not on file  Transportation Needs: Not on file  Physical Activity: Not on file  Stress: Not on file  Social Connections: Not on file  Intimate Partner Violence: Not on file  Depression (PHQ2-9): High Risk (01/12/2024)   Depression (PHQ2-9)    PHQ-2 Score: 11  Alcohol Screen: Low Risk (02/03/2023)   Alcohol Screen    Last Alcohol Screening Score (AUDIT): 0  Housing: Not on file  Utilities: Not on file  Health Literacy: Not on file    Family History  Problem Relation Age of Onset   Ovarian cancer Mother    Pancreatic cancer Mother    Diabetes Mellitus II Mother    Stroke Father    Heart disease Father    Cancer Father    Diabetes Mellitus II Father  Coronary artery disease Father    Mental illness Father    Anxiety disorder Sister    Mitral valve prolapse Sister    Heart attack Sister    Drug abuse Daughter     Allergies[1]  Show/hide medication list[2]  Review of Systems  Constitutional: Negative.   HENT:  Positive for congestion.   Eyes: Negative.   Respiratory:  Positive for cough.   Cardiovascular: Negative.  Negative for chest pain.  Gastrointestinal: Negative.  Negative for abdominal pain, constipation and diarrhea.  Genitourinary: Negative.   Musculoskeletal:  Negative for joint pain and myalgias.  Skin: Negative.   Neurological: Negative.  Negative for dizziness.  Endo/Heme/Allergies: Negative.   All other systems reviewed and are negative.      Objective:   BP (!)  148/72   Pulse (!) 101   Ht 5' 5 (1.651 m)   Wt 180 lb (81.6 kg)   LMP  (LMP Unknown) Comment: last cycle over a year ago  SpO2 98%   BMI 29.95 kg/m   Vitals:   08/05/24 0929  BP: (!) 148/72  Pulse: (!) 101  Height: 5' 5 (1.651 m)  Weight: 180 lb (81.6 kg)  SpO2: 98%  BMI (Calculated): 29.95    Physical Exam Vitals and nursing note reviewed.  Constitutional:      Appearance: Normal appearance. She is normal weight.  HENT:     Head: Normocephalic and atraumatic.     Nose: Nose normal.     Mouth/Throat:     Mouth: Mucous membranes are moist.  Eyes:     Extraocular Movements: Extraocular movements intact.     Conjunctiva/sclera: Conjunctivae normal.     Pupils: Pupils are equal, round, and reactive to light.  Cardiovascular:     Rate and Rhythm: Normal rate and regular rhythm.     Pulses: Normal pulses.     Heart sounds: Normal heart sounds.  Pulmonary:     Effort: Pulmonary effort is normal.     Breath sounds: Normal breath sounds.  Abdominal:     General: Abdomen is flat. Bowel sounds are normal.     Palpations: Abdomen is soft.  Musculoskeletal:        General: Normal range of motion.     Cervical back: Normal range of motion.  Skin:    General: Skin is warm and dry.  Neurological:     General: No focal deficit present.     Mental Status: She is alert and oriented to person, place, and time.  Psychiatric:        Mood and Affect: Mood normal.        Behavior: Behavior normal.        Thought Content: Thought content normal.        Judgment: Judgment normal.      No results found for any visits on 08/05/24.  No results found for this or any previous visit (from the past 2160 hours).     Assessment & Plan:  Chest xray today Supportive care, saline nasal spray Drink plenty of water  Problem List Items Addressed This Visit       Cardiovascular and Mediastinum   Essential hypertension     Respiratory   Allergic rhinitis     Other   Acute  cough - Primary   Relevant Orders   DG Chest 2 View (Completed)    Return if symptoms worsen or fail to improve, for as scheduled.   Total time spent: 25 minutes. This time  includes review of previous notes and results and patient face to face interaction during today's visit.    Jeoffrey Pollen, NP  08/05/2024   This document may have been prepared by [employer] Voice Recognition software and as such may include unintentional dictation errors.      [1]  Allergies Allergen Reactions   Bactrim [Sulfamethoxazole-Trimethoprim] Anaphylaxis   Gadolinium Anaphylaxis   Gadolinium Derivatives Anaphylaxis   Iodinated Contrast Media Anaphylaxis   Iodine Anaphylaxis   Other Nausea And Vomiting, Rash and Hives    Uncoded Allergy. Allergen: BEZONZTATE Uncoded Allergy. Allergen: decongestants   Sulfa Antibiotics Anaphylaxis and Other (See Comments)    Uncoded Allergy. Allergen: decongestants Uncoded Allergy. Allergen: decongestants    Azithromycin Hives   Clarithromycin Other (See Comments)   Furosemide Other (See Comments)   Guaifenesin Nausea And Vomiting   Macrobid [Nitrofurantoin Monohyd Macro] Swelling    Tongue swelling, chest tightness    Nitrofurantoin Hives   Penicillins Hives   Allegra [Fexofenadine] Palpitations    Heart Racing    Amoxicillin Rash   Doxycycline Rash and Other (See Comments)   Keflex [Cephalexin] Rash and Other (See Comments)   Levofloxacin Rash  [2]  Outpatient Medications Prior to Visit  Medication Sig   acyclovir  (ZOVIRAX ) 400 MG tablet TAKE 1 TABLET BY MOUTH 3 TIMES DAILY.   albuterol  (VENTOLIN  HFA) 108 (90 Base) MCG/ACT inhaler Inhale 1-2 puffs into the lungs every 4 (four) hours as needed for wheezing or shortness of breath.   ALPRAZolam  (XANAX ) 1 MG tablet Take 1 mg by mouth 3 (three) times daily as needed for anxiety. 1 mg three times a day and 2 mg at bedtime   budesonide -formoterol  (SYMBICORT ) 80-4.5 MCG/ACT inhaler Inhale 2 puffs into the lungs  2 (two) times daily.   cetirizine  (ZYRTEC ) 10 MG tablet Take 1 tablet (10 mg total) by mouth daily.   Cholecalciferol (VITAMIN D3) 125 MCG (5000 UT) CAPS Take by mouth. (Patient not taking: Reported on 05/26/2024)   fluticasone  (FLONASE ) 50 MCG/ACT nasal spray Place 2 sprays into both nostrils daily.   valsartan  (DIOVAN ) 80 MG tablet Take 1 tablet (80 mg total) by mouth daily.   [DISCONTINUED] amitriptyline  (ELAVIL ) 50 MG tablet TAKE 1 TABLET BY MOUTH EVERYDAY AT BEDTIME (Patient not taking: Reported on 05/26/2024)   [DISCONTINUED] amphetamine-dextroamphetamine (ADDERALL XR) 20 MG 24 hr capsule Take 20 mg by mouth every morning. (Patient not taking: Reported on 05/26/2024)   [DISCONTINUED] pantoprazole  (PROTONIX ) 20 MG tablet TAKE 1 TABLET BY MOUTH EVERY DAY (Patient not taking: Reported on 05/26/2024)   [DISCONTINUED] triamcinolone  cream (KENALOG ) 0.5 % Apply 1 Application topically 3 (three) times daily. (Patient not taking: Reported on 05/26/2024)   No facility-administered medications prior to visit.   "

## 2024-08-08 NOTE — Telephone Encounter (Signed)
 Entered in error

## 2024-08-11 ENCOUNTER — Ambulatory Visit: Admitting: Cardiology

## 2024-08-15 DIAGNOSIS — R051 Acute cough: Secondary | ICD-10-CM | POA: Insufficient documentation

## 2024-08-24 ENCOUNTER — Telehealth: Payer: Self-pay | Admitting: Cardiology

## 2024-08-24 ENCOUNTER — Other Ambulatory Visit: Payer: Self-pay | Admitting: Cardiology

## 2024-08-24 DIAGNOSIS — J309 Allergic rhinitis, unspecified: Secondary | ICD-10-CM

## 2024-08-24 NOTE — Telephone Encounter (Signed)
 PT called in regards to wondering why she was blocked from office phone number & messaging on mychart. Informed pt that she is not blocked on either. Pt stated she would like a referral to the allerginist, wants to know if she should come in to be seen to get referral or if you couls write her one so she doesn't have to come in,, please advise

## 2024-08-25 ENCOUNTER — Other Ambulatory Visit: Payer: Self-pay | Admitting: Cardiology

## 2024-08-25 DIAGNOSIS — Z889 Allergy status to unspecified drugs, medicaments and biological substances status: Secondary | ICD-10-CM

## 2024-08-25 DIAGNOSIS — J309 Allergic rhinitis, unspecified: Secondary | ICD-10-CM

## 2024-08-25 DIAGNOSIS — J4530 Mild persistent asthma, uncomplicated: Secondary | ICD-10-CM
